# Patient Record
Sex: Female | Born: 1965
Health system: Southern US, Community
[De-identification: ages and names within clinical notes are randomized; demographics above are authoritative.]

## PROBLEM LIST (undated history)

## (undated) DIAGNOSIS — F3289 Other specified depressive episodes: Secondary | ICD-10-CM

## (undated) DIAGNOSIS — F329 Major depressive disorder, single episode, unspecified: Secondary | ICD-10-CM

## (undated) DIAGNOSIS — E559 Vitamin D deficiency, unspecified: Secondary | ICD-10-CM

## (undated) DIAGNOSIS — M129 Arthropathy, unspecified: Secondary | ICD-10-CM

## (undated) DIAGNOSIS — K219 Gastro-esophageal reflux disease without esophagitis: Secondary | ICD-10-CM

## (undated) DIAGNOSIS — G43909 Migraine, unspecified, not intractable, without status migrainosus: Secondary | ICD-10-CM

## (undated) DIAGNOSIS — D649 Anemia, unspecified: Secondary | ICD-10-CM

## (undated) DIAGNOSIS — J45909 Unspecified asthma, uncomplicated: Secondary | ICD-10-CM

## (undated) DIAGNOSIS — J309 Allergic rhinitis, unspecified: Secondary | ICD-10-CM

## (undated) DIAGNOSIS — D219 Benign neoplasm of connective and other soft tissue, unspecified: Secondary | ICD-10-CM

## (undated) HISTORY — DX: Allergic rhinitis, unspecified: J30.9

## (undated) HISTORY — DX: Other specified depressive episodes: F32.89

## (undated) HISTORY — DX: Gastro-esophageal reflux disease without esophagitis: K21.9

## (undated) HISTORY — DX: Morbid (severe) obesity due to excess calories: E66.01

## (undated) HISTORY — DX: Vitamin D deficiency, unspecified: E55.9

## (undated) HISTORY — DX: Unspecified asthma, uncomplicated: J45.909

## (undated) HISTORY — DX: Benign neoplasm of connective and other soft tissue, unspecified: D21.9

## (undated) HISTORY — PX: ROOT CANAL: SHX2363

## (undated) HISTORY — DX: Arthropathy, unspecified: M12.9

## (undated) HISTORY — PX: OTHER SURGICAL HISTORY: SHX169

## (undated) HISTORY — DX: Anemia, unspecified: D64.9

## (undated) HISTORY — DX: Major depressive disorder, single episode, unspecified: F32.9

## (undated) HISTORY — DX: Migraine, unspecified, not intractable, without status migrainosus: G43.909

---

## 1998-04-02 ENCOUNTER — Other Ambulatory Visit: Admission: RE | Admit: 1998-04-02 | Discharge: 1998-04-02 | Payer: Self-pay | Admitting: Internal Medicine

## 1999-12-30 ENCOUNTER — Other Ambulatory Visit: Admission: RE | Admit: 1999-12-30 | Discharge: 1999-12-30 | Payer: Self-pay | Admitting: Internal Medicine

## 2001-01-30 ENCOUNTER — Encounter: Admission: RE | Admit: 2001-01-30 | Discharge: 2001-03-06 | Payer: Self-pay | Admitting: Internal Medicine

## 2001-02-28 ENCOUNTER — Other Ambulatory Visit: Admission: RE | Admit: 2001-02-28 | Discharge: 2001-02-28 | Payer: Self-pay | Admitting: Internal Medicine

## 2002-03-14 ENCOUNTER — Other Ambulatory Visit: Admission: RE | Admit: 2002-03-14 | Discharge: 2002-03-14 | Payer: Self-pay | Admitting: Internal Medicine

## 2002-04-23 ENCOUNTER — Emergency Department (HOSPITAL_COMMUNITY): Admission: EM | Admit: 2002-04-23 | Discharge: 2002-04-23 | Payer: Self-pay | Admitting: Emergency Medicine

## 2005-06-05 ENCOUNTER — Emergency Department (HOSPITAL_COMMUNITY): Admission: EM | Admit: 2005-06-05 | Discharge: 2005-06-05 | Payer: Self-pay | Admitting: Emergency Medicine

## 2005-08-27 ENCOUNTER — Emergency Department (HOSPITAL_COMMUNITY): Admission: EM | Admit: 2005-08-27 | Discharge: 2005-08-27 | Payer: Self-pay | Admitting: Emergency Medicine

## 2005-10-02 ENCOUNTER — Ambulatory Visit (HOSPITAL_COMMUNITY): Admission: RE | Admit: 2005-10-02 | Discharge: 2005-10-03 | Payer: Self-pay | Admitting: General Surgery

## 2005-10-02 ENCOUNTER — Encounter (INDEPENDENT_AMBULATORY_CARE_PROVIDER_SITE_OTHER): Payer: Self-pay | Admitting: *Deleted

## 2006-08-20 ENCOUNTER — Ambulatory Visit (HOSPITAL_COMMUNITY): Admission: RE | Admit: 2006-08-20 | Discharge: 2006-08-20 | Payer: Self-pay | Admitting: Obstetrics & Gynecology

## 2006-09-03 ENCOUNTER — Encounter: Admission: RE | Admit: 2006-09-03 | Discharge: 2006-09-03 | Payer: Self-pay | Admitting: Obstetrics & Gynecology

## 2006-10-18 ENCOUNTER — Ambulatory Visit (HOSPITAL_COMMUNITY): Admission: RE | Admit: 2006-10-18 | Discharge: 2006-10-18 | Payer: Self-pay | Admitting: Obstetrics & Gynecology

## 2007-02-21 ENCOUNTER — Emergency Department (HOSPITAL_COMMUNITY): Admission: EM | Admit: 2007-02-21 | Discharge: 2007-02-22 | Payer: Self-pay | Admitting: Emergency Medicine

## 2007-06-23 ENCOUNTER — Emergency Department (HOSPITAL_COMMUNITY): Admission: EM | Admit: 2007-06-23 | Discharge: 2007-06-23 | Payer: Self-pay | Admitting: Emergency Medicine

## 2007-06-25 ENCOUNTER — Encounter: Admission: RE | Admit: 2007-06-25 | Discharge: 2007-06-25 | Payer: Self-pay | Admitting: Obstetrics & Gynecology

## 2007-08-08 HISTORY — PX: CHOLECYSTECTOMY: SHX55

## 2008-01-28 ENCOUNTER — Encounter: Admission: RE | Admit: 2008-01-28 | Discharge: 2008-01-28 | Payer: Self-pay | Admitting: Obstetrics & Gynecology

## 2008-11-05 LAB — HM MAMMOGRAPHY

## 2009-02-26 ENCOUNTER — Encounter: Admission: RE | Admit: 2009-02-26 | Discharge: 2009-02-26 | Payer: Self-pay | Admitting: Obstetrics & Gynecology

## 2010-01-05 LAB — CONVERTED CEMR LAB

## 2010-02-01 ENCOUNTER — Ambulatory Visit (HOSPITAL_COMMUNITY): Admission: RE | Admit: 2010-02-01 | Discharge: 2010-02-01 | Payer: Self-pay | Admitting: Obstetrics & Gynecology

## 2010-02-09 ENCOUNTER — Ambulatory Visit: Payer: Self-pay | Admitting: Internal Medicine

## 2010-02-09 DIAGNOSIS — F3289 Other specified depressive episodes: Secondary | ICD-10-CM | POA: Insufficient documentation

## 2010-02-09 DIAGNOSIS — D649 Anemia, unspecified: Secondary | ICD-10-CM | POA: Insufficient documentation

## 2010-02-09 DIAGNOSIS — J45909 Unspecified asthma, uncomplicated: Secondary | ICD-10-CM | POA: Insufficient documentation

## 2010-02-09 DIAGNOSIS — K219 Gastro-esophageal reflux disease without esophagitis: Secondary | ICD-10-CM | POA: Insufficient documentation

## 2010-02-09 DIAGNOSIS — Z9189 Other specified personal risk factors, not elsewhere classified: Secondary | ICD-10-CM | POA: Insufficient documentation

## 2010-02-09 DIAGNOSIS — G43909 Migraine, unspecified, not intractable, without status migrainosus: Secondary | ICD-10-CM | POA: Insufficient documentation

## 2010-02-09 DIAGNOSIS — F329 Major depressive disorder, single episode, unspecified: Secondary | ICD-10-CM

## 2010-02-09 DIAGNOSIS — R109 Unspecified abdominal pain: Secondary | ICD-10-CM | POA: Insufficient documentation

## 2010-02-09 DIAGNOSIS — J309 Allergic rhinitis, unspecified: Secondary | ICD-10-CM | POA: Insufficient documentation

## 2010-02-09 DIAGNOSIS — M1990 Unspecified osteoarthritis, unspecified site: Secondary | ICD-10-CM | POA: Insufficient documentation

## 2010-02-09 LAB — CONVERTED CEMR LAB
ALT: 14 units/L (ref 0–35)
AST: 15 units/L (ref 0–37)
Albumin: 3.7 g/dL (ref 3.5–5.2)
Alkaline Phosphatase: 91 units/L (ref 39–117)
BUN: 10 mg/dL (ref 6–23)
Basophils Absolute: 0 10*3/uL (ref 0.0–0.1)
Basophils Relative: 0.5 % (ref 0.0–3.0)
Bilirubin, Direct: 0.1 mg/dL (ref 0.0–0.3)
CO2: 29 meq/L (ref 19–32)
Calcium: 8.8 mg/dL (ref 8.4–10.5)
Chloride: 108 meq/L (ref 96–112)
Creatinine, Ser: 0.9 mg/dL (ref 0.4–1.2)
Eosinophils Absolute: 0.1 10*3/uL (ref 0.0–0.7)
Eosinophils Relative: 2 % (ref 0.0–5.0)
GFR calc non Af Amer: 85.34 mL/min (ref >60)
Glucose, Bld: 81 mg/dL (ref 70–99)
HCT: 39.8 % (ref 36.0–46.0)
Hemoglobin: 13.2 g/dL (ref 12.0–15.0)
Lymphocytes Relative: 32.3 % (ref 12.0–46.0)
Lymphs Abs: 2.2 10*3/uL (ref 0.7–4.0)
MCHC: 33.3 g/dL (ref 30.0–36.0)
MCV: 81.2 fL (ref 78.0–100.0)
Monocytes Absolute: 0.4 10*3/uL (ref 0.1–1.0)
Monocytes Relative: 6.4 % (ref 3.0–12.0)
Neutro Abs: 4.1 10*3/uL (ref 1.4–7.7)
Neutrophils Relative %: 58.8 % (ref 43.0–77.0)
Platelets: 351 10*3/uL (ref 150.0–400.0)
Potassium: 4.4 meq/L (ref 3.5–5.1)
RBC: 4.9 M/uL (ref 3.87–5.11)
RDW: 14.3 % (ref 11.5–14.6)
Sodium: 140 meq/L (ref 135–145)
TSH: 0.71 microintl units/mL (ref 0.35–5.50)
Total Bilirubin: 0.4 mg/dL (ref 0.3–1.2)
Total Protein: 6.9 g/dL (ref 6.0–8.3)
WBC: 6.9 10*3/uL (ref 4.5–10.5)

## 2010-03-02 ENCOUNTER — Ambulatory Visit: Payer: Self-pay | Admitting: Internal Medicine

## 2010-07-13 ENCOUNTER — Ambulatory Visit: Payer: Self-pay | Admitting: Internal Medicine

## 2010-07-13 DIAGNOSIS — R42 Dizziness and giddiness: Secondary | ICD-10-CM | POA: Insufficient documentation

## 2010-08-15 ENCOUNTER — Ambulatory Visit
Admission: RE | Admit: 2010-08-15 | Discharge: 2010-08-15 | Payer: Self-pay | Source: Home / Self Care | Attending: Internal Medicine | Admitting: Internal Medicine

## 2010-08-15 DIAGNOSIS — J329 Chronic sinusitis, unspecified: Secondary | ICD-10-CM | POA: Insufficient documentation

## 2010-08-15 DIAGNOSIS — J019 Acute sinusitis, unspecified: Secondary | ICD-10-CM | POA: Insufficient documentation

## 2010-08-28 ENCOUNTER — Encounter: Payer: Self-pay | Admitting: Obstetrics & Gynecology

## 2010-09-06 NOTE — Assessment & Plan Note (Signed)
Summary: ?vertigo/ears stopped up/sinuses draining/rx refill-lb   Vital Signs:  Patient profile:   45 year old female Height:      63 inches (160.02 cm) Weight:      264 pounds (120.00 kg) O2 Sat:      98 % on Room air Temp:     97.7 degrees F (36.50 degrees C) oral Pulse rate:   75 / minute BP sitting:   128 / 84  (left arm) Cuff size:   large  Vitals Entered By: Orlan Leavens RMA (July 13, 2010 2:27 PM)  O2 Flow:  Room air CC: Vertigo Is Patient Diabetic? No Pain Assessment Patient in pain? no      Comments Want to discuss sertraline, also need refills on nexium   Primary Care Provider:  Newt Lukes MD  CC:  Vertigo.  History of Present Illness: here for vertigo symptoms  onset >1 week ago - symptoms worse with head mvmt a/w sinus pressure and ear fullness no fever, no drainage - hx same - not improved with otc dramamine  depression - feels "stressed" and "no energy" primarily c/o motivational fatigue - c/o poor sleep - but +daytime sleepiness - +panic and anxiety spells "unexpected like in bed" - but no tearfulness contrib factors include work and death of dad >12 mo ago denies SI/HI started low dose sertraline - no adv se noted inc dose but not better than low dose, ?resume low dose   Clinical Review Panels:  Prevention   Last Mammogram:  No specific mammographic evidence of malignancy.   (11/05/2008)   Last Pap Smear:  Interpretation Result:Negative for intraepithelial Lesion or Malignancy.    (01/05/2010)  Immunizations   Last Tetanus Booster:  Historical (08/08/2007)   Last Pneumovax:  Historical (08/08/2007)  CBC   WBC:  6.9 (02/09/2010)   RBC:  4.90 (02/09/2010)   Hgb:  13.2 (02/09/2010)   Hct:  39.8 (02/09/2010)   Platelets:  351.0 (02/09/2010)   MCV  81.2 (02/09/2010)   MCHC  33.3 (02/09/2010)   RDW  14.3 (02/09/2010)   PMN:  58.8 (02/09/2010)   Lymphs:  32.3 (02/09/2010)   Monos:  6.4 (02/09/2010)   Eosinophils:  2.0  (02/09/2010)   Basophil:  0.5 (02/09/2010)  Complete Metabolic Panel   Glucose:  81 (02/09/2010)   Sodium:  140 (02/09/2010)   Potassium:  4.4 (02/09/2010)   Chloride:  108 (02/09/2010)   CO2:  29 (02/09/2010)   BUN:  10 (02/09/2010)   Creatinine:  0.9 (02/09/2010)   Albumin:  3.7 (02/09/2010)   Total Protein:  6.9 (02/09/2010)   Calcium:  8.8 (02/09/2010)   Total Bili:  0.4 (02/09/2010)   Alk Phos:  91 (02/09/2010)   SGPT (ALT):  14 (02/09/2010)   SGOT (AST):  15 (02/09/2010)   Current Medications (verified): 1)  Nexium 40 Mg Cpdr (Esomeprazole Magnesium) .... Take 1 By Mouth Once Daily 2)  Align  Caps (Probiotic Product) .Marland Kitchen.. 1 By Mouth Once Daily 3)  Sertraline Hcl 50 Mg Tabs (Sertraline Hcl) .Marland Kitchen.. 1 By Mouth Once Daily 4)  Ventolin Hfa 108 (90 Base) Mcg/act Aers (Albuterol Sulfate) .... 2 Puffs Every 4 Hours As Needed For Shortness of Breath  Allergies (verified): 1)  ! Penicillin  Past History:  Past Medical History: Allergic rhinitis Anemia-NOS Asthma GERD  anxiety   MD roster: gyn - jackson-moore    Review of Systems  The patient denies fever, vision loss, decreased hearing, chest pain, and headaches.  Physical Exam  General:  overweight-appearing.  alert, well-developed, well-nourished, and cooperative to examination.   mildly ill Head:  mild tenderness to palp over B max sinuses Eyes:  vision grossly intact; pupils equal, round and reactive to light.  conjunctiva and lids normal.    Ears:  B hazy TM with clear effusion R>L, no erythema Mouth:  teeth and gums in good repair; mucous membranes moist, without lesions or ulcers. oropharynx clear without exudate, no erythema. +PND Lungs:  normal respiratory effort, no intercostal retractions or use of accessory muscles; normal breath sounds bilaterally - no crackles and no wheezes.    Heart:  normal rate, regular rhythm, no murmur, and no rub. BLE without edema. Neurologic:  alert & oriented X3 and cranial  nerves II-XII symetrically intact.  strength normal in all extremities, sensation intact to light touch, and gait normal. speech fluent without dysarthria or aphasia; follows commands with good comprehension.  Psych:  Oriented X3, memory intact for recent and remote, normally interactive, fai eye contact, not anxious appearing mildly depressed appearing with subdued affect, not agitated.      Impression & Recommendations:  Problem # 1:  VERTIGO (ICD-780.4)  add meclizine also nasal steroid + zyrtec d for sinus symptoms  Her updated medication list for this problem includes:    Meclizine Hcl 25 Mg Tabs (Meclizine hcl) .Marland Kitchen... 1 by mouth three times a day as needed for dizzy symptoms  Demonstrated maneuvers to self-treat vertigo. Patient to call to be seen if no improvement in 10-14 days, sooner if worse.   Orders: Prescription Created Electronically [phone])  Problem # 2:  DEPRESSION (ICD-311)  Her updated medication list for this problem includes:    Sertraline Hcl 25 Mg Tabs (Sertraline hcl) .Marland Kitchen... 1 by mouth once daily  cont low dose sertraline as no sig change with higher dose trial  Problem # 3:  GERD (ICD-530.81)  Her updated medication list for this problem includes:    Nexium 40 Mg Cpdr (Esomeprazole magnesium) .Marland Kitchen... Take 1 by mouth once daily  Labs Reviewed: Hgb: 13.2 (02/09/2010)   Hct: 39.8 (02/09/2010)  Complete Medication List: 1)  Nexium 40 Mg Cpdr (Esomeprazole magnesium) .... Take 1 by mouth once daily 2)  Align Caps (Probiotic product) .Marland Kitchen.. 1 by mouth once daily 3)  Sertraline Hcl 25 Mg Tabs (Sertraline hcl) .Marland Kitchen.. 1 by mouth once daily 4)  Ventolin Hfa 108 (90 Base) Mcg/act Aers (Albuterol sulfate) .... 2 puffs every 4 hours as needed for shortness of breath 5)  Meclizine Hcl 25 Mg Tabs (Meclizine hcl) .Marland Kitchen.. 1 by mouth three times a day as needed for dizzy symptoms 6)  Fluticasone Propionate 50 Mcg/act Susp (Fluticasone propionate) .... 2 sprays each nostril  every  morning 7)  Zyrtec-d Allergy & Congestion 5-120 Mg Xr12h-tab (Cetirizine-pseudoephedrine) .Marland Kitchen.. 1 by mouth every morning  Patient Instructions: 1)  it was good to see you today. 2)  meclizine for dizziness and generic flonase for sinus symptoms  - also refill on nexium and lower sertraline dose - your prescriptions have been electronically submitted to your pharmacy. Please take as directed. Contact our office if you believe you're having problems with the medication(s).  3)  Get plenty of rest, drink lots of clear liquids, and use Tylenol or Ibuprofen for comfort. Return in 7-10 days if you're not better:sooner if you're feeling worse. 4)  Please schedule for physical and labs as discussed, call sooner if problems.  Prescriptions: FLUTICASONE PROPIONATE 50 MCG/ACT SUSP (FLUTICASONE PROPIONATE) 2  sprays each nostril  every morning  #1 x 3   Entered and Authorized by:   Newt Lukes MD   Signed by:   Newt Lukes MD on 07/13/2010   Method used:   Electronically to        [facility] (retail)       [address]       [place], [place]  [id number]       Ph: [phone]       Fax: [phone]   RxID:   [id number] MECLIZINE HCL 25 MG TABS (MECLIZINE HCL) 1 by mouth three times a day as needed for dizzy symptoms  #30 x 0   Entered and Authorized by:   Newt Lukes MD   Signed by:   Newt Lukes MD on 07/13/2010   Method used:   Electronically to        [facility] (retail)       [address]       [place], [place]  [id number]       Ph: [phone]       Fax: [phone]   RxID:   [id number] SERTRALINE HCL 25 MG TABS (SERTRALINE HCL) 1 by mouth once daily  #30 x 6   Entered and Authorized by:   Newt Lukes MD   Signed by:   Newt Lukes MD on 07/13/2010   Method used:   Electronically to        [facility] (retail)       [address]       [place], [place]   [id number]       Ph: [phone]       Fax: [phone]   RxID:   [id number]    Orders Added: 1)  Est. Patient Level IV [61443] 2)  Prescription Created Electronically [phone]

## 2010-09-06 NOTE — Assessment & Plan Note (Signed)
Summary: 3-4 wk fu---stc   Vital Signs:  Patient profile:   45 year old female Height:      63 inches (160.02 cm) Weight:      264.4 pounds (120.18 kg) O2 Sat:      98 % on Room air Temp:     98.4 degrees F (36.89 degrees C) oral Pulse rate:   95 / minute BP sitting:   120 / 82  (left arm) Cuff size:   large  Vitals Entered By: Orlan Leavens RMA (March 02, 2010 8:59 AM)  O2 Flow:  Room air CC: 3-4 week follow-up Is Patient Diabetic? No Pain Assessment Patient in pain? no        Primary Care Provider:  Newt Lukes MD  CC:  3-4 week follow-up.  History of Present Illness: here for f/u  c/o abd pain - onset 2 months ago- located across right side assoc with bloating and constipation - symptoms unchanged by diet efforts or meal times not improved with OTC meds off phenteramine 1 week - and seems improved s/p chole 2009 - but similar symptoms now as preop and postop no vomitting or nausea - eval by gyn negative for pelvic or ovarian cause of symptoms  improved with use of align  also c/o depression - feels "stressed" and "no energy" primarily c/o motivational fatigue - c/o poor sleep - but +daytime sleepiness - +panic and anxiety spells "unexpected like in bed" - but no tearfulness contrib factors include work and death of dad 12 mo ago (1year anniv death 4 days ago) - denies SI/HI started low dose sertraline - no adv se noted   Current Medications (verified): 1)  Nexium 40 Mg Cpdr (Esomeprazole Magnesium) .... Take 1 By Mouth Once Daily 2)  Align  Caps (Probiotic Product) .Marland Kitchen.. 1 By Mouth Once Daily 3)  Sertraline Hcl 25 Mg Tabs (Sertraline Hcl) .Marland Kitchen.. 1 By Mouth Once Daily  Allergies (verified): 1)  ! Penicillin  Past History:  Past Medical History: Allergic rhinitis Anemia-NOS Asthma GERD  anxiety  MD roster: gyn - jackson-moore  Review of Systems  The patient denies fever, chest pain, syncope, headaches, and abdominal pain.    Physical  Exam  General:  overweight-appearing.  alert, well-developed, well-nourished, and cooperative to examination.    Lungs:  normal respiratory effort, no intercostal retractions or use of accessory muscles; normal breath sounds bilaterally - no crackles and no wheezes.    Heart:  normal rate, regular rhythm, no murmur, and no rub. BLE without edema. Psych:  Oriented X3, memory intact for recent and remote, normally interactive, fai eye contact, not anxious appearing mildly depressed appearing with subdued affect, not agitated.      Impression & Recommendations:  Problem # 1:  DEPRESSION (ICD-311)  Her updated medication list for this problem includes:    Sertraline Hcl 50 Mg Tabs (Sertraline hcl) .Marland Kitchen... 1 by mouth once daily  may be contib to GI symptoms - see above- agrees to increase titration sertraline - erx done Time spent with patient 25 minutes, more than 50% of this time was spent counseling patient on depression and problems concerning the need for further med eval and tx trials - declines counseling referral at this time  Orders: Prescription Created Electronically [phone])  Problem # 2:  ABDOMINAL PAIN (ICD-789.00) Assessment: Improved  exam and hx generally benign -  s/p chole 2009 ?IBS, constipation predom- no med dz identified with recent labs (02/2010) - continue probiotics and simethacone -  Complete Medication List: 1)  Nexium 40 Mg Cpdr (Esomeprazole magnesium) .... Take 1 by mouth once daily 2)  Align Caps (Probiotic product) .Marland Kitchen.. 1 by mouth once daily 3)  Sertraline Hcl 50 Mg Tabs (Sertraline hcl) .Marland Kitchen.. 1 by mouth once daily 4)  Ventolin Hfa 108 (90 Base) Mcg/act Aers (Albuterol sulfate) .... 2 puffs every 4 hours as needed for shortness of breath  Patient Instructions: 1)  it was good to see you today. 2)  continue Align, or phillips colon health or any "probiotic" and take daily for your abdominal symptoms and constipation -  3)  continue sertraline for  depression, dose increase as as discussed -  4)  your prescription has been electronically submitted to your pharmacy. Please take as directed. Contact our office if you believe you're having problems with the medication(s).  5)  Please schedule a follow-up appointment in 2-3 months to reveiw your symptoms and medications, sooner if problems.  Prescriptions: SERTRALINE HCL 50 MG TABS (SERTRALINE HCL) 1 by mouth once daily  #30 x 2   Entered and Authorized by:   Newt Lukes MD   Signed by:   Newt Lukes MD on 03/02/2010   Method used:   Electronically to        [facility] (retail)       [address]       [place], [place]  [id number]       Ph: [phone]       Fax: [phone]   RxID:   [id number]

## 2010-09-06 NOTE — Assessment & Plan Note (Signed)
Summary: NEW / [facility] / # /CD   Vital Signs:  Patient profile:   45 year old female Height:      63 inches (160.02 cm) Weight:      264.6 pounds (120.27 kg) BMI:     47.04 O2 Sat:      99 % on Room air Temp:     97.6 degrees F (36.44 degrees C) oral Pulse rate:   80 / minute BP sitting:   110 / 82  (left arm) Cuff size:   large  Vitals Entered By: Orlan Leavens (February 09, 2010 9:39 AM)  O2 Flow:  Room air CC: New patient Is Patient Diabetic? No Pain Assessment Patient in pain? no        Primary Care Provider:  Newt Lukes MD  CC:  New patient.  History of Present Illness: new pt to me and our practice, here to est care -  c/o abd pain - onset 2 months ago- located across right side assoc with bloating and constipation - symptoms unchanged by diet efforts or meal times not improved with OTC meds off phenteramine 1 week - and seems improved s/p chole 2009 - but similar symptoms now as preop and postop no vomitting or nausea - eval by gyn negative for pelvic or ovarian cause of symptoms   also c/o depression - feels "stressed" and "no energy" primarily c/o motivational fatigue - c/o poor sleep - but +daytime sleepiness - +panic and anxiety spells "unexpected like in bed" - but no tearfulness contrib factors include work and death of dad 12 mo ago (1year anniv death 4 days ago) - denies SI/HI   Preventive Screening-Counseling & Management  Alcohol-Tobacco     Alcohol drinks/day: <1     Alcohol Counseling: not indicated; use of alcohol is not excessive or problematic     Smoking Status: never     Tobacco Counseling: not indicated; no tobacco use  Caffeine-Diet-Exercise     Does Patient Exercise: yes     Exercise Counseling: to improve exercise regimen  Safety-Violence-Falls     Seat Belt Use: yes     Seat Belt Counseling: not indicated; patient wears seat belts     Helmet Use: yes     Helmet Counseling: not indicated; patient wears helmet when  riding bicycle/motocycle     Firearms in the Home: no firearms in the home     Firearm Counseling: not applicable     Violence in the Home: no risk noted  Clinical Review Panels:  Prevention   Last Mammogram:  No specific mammographic evidence of malignancy.   (11/05/2008)   Last Pap Smear:  Interpretation Result:Negative for intraepithelial Lesion or Malignancy.    (01/05/2010)  Immunizations   Last Tetanus Booster:  Historical (08/08/2007)   Last Pneumovax:  Historical (08/08/2007)   Current Medications (verified): 1)  Nexium 40 Mg Cpdr (Esomeprazole Magnesium) .... Take 1 By Mouth Once Daily  Allergies (verified): 1)  ! Penicillin  Past History:  Past Medical History: Allergic rhinitis Anemia-NOS Asthma GERD  MD roster: gyn Tamela Oddi  Past Surgical History: Cholecystectomy (2009) Skin graft surgery on (R) leg (childhood)   Family History: Family History of Alcoholism/Addiction (parent) Family History of Arthritis (parent, grandparent) Family History Diabetes 1st degree relative (parent, grandparent) Family History High cholesterol (parent, grandparen, other Radio producer) Family History Hypertension (parent, grandparent, other relativ) Family History Ovarian cancer (grandparent)  Family History of Prostate CA 1st degree relative <50 (grandfather)  Social History:  Never Smoked, social alcohol divorced, lives with twin teenage daughters 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 I have reviewed all other systems and they were negative.   Physical Exam  General:  overweight-appearing.  alert, well-developed, well-nourished, and cooperative to examination.    Head:  Normocephalic and atraumatic without obvious abnormalities. No apparent alopecia or balding. Eyes:  vision grossly intact; pupils equal, round and reactive to light.  conjunctiva and  lids normal.    Ears:  normal pinnae bilaterally, without erythema, swelling, or tenderness to palpation. TMs clear, without effusion, or cerumen impaction. Hearing grossly normal bilaterally  Mouth:  teeth and gums in good repair; mucous membranes moist, without lesions or ulcers. oropharynx clear without exudate, no erythema.  Lungs:  normal respiratory effort, no intercostal retractions or use of accessory muscles; normal breath sounds bilaterally - no crackles and no wheezes.    Heart:  normal rate, regular rhythm, no murmur, and no rub. BLE without edema. Abdomen:  soft, non-tender, normal bowel sounds, no distention; no masses and no appreciable hepatomegaly or splenomegaly.   Msk:  No deformity or scoliosis noted of thoracic or lumbar spine.   Neurologic:  alert & oriented X3 and cranial nerves II-XII symetrically intact.  strength normal in all extremities, sensation intact to light touch, and gait normal. speech fluent without dysarthria or aphasia; follows commands with good comprehension.  Skin:  no rashes, vesicles, ulcers, or erythema. No nodules or irregularity to palpation.  Psych:  Oriented X3, memory intact for recent and remote, normally interactive, fai eye contact, not anxious appearing mildly depressed appearing with subdued affect,  not agitated.      Impression & Recommendations:  Problem # 1:  ABDOMINAL PAIN (ICD-789.00)  exam and hx generally benign -  s/p chole 2009 ?IBS, constipation predom- r/o med dz with labs - rec trial probiotics and simethacone - cont eval and tx to depend on lab and response to tx efforts - Orders: TLB-CBC Platelet - w/Differential (85025-CBCD) TLB-TSH (Thyroid Stimulating Hormone) (84443-TSH) TLB-Hepatic/Liver Function Pnl (80076-HEPATIC) TLB-BMP (Basic Metabolic Panel-BMET) (80048-METABOL)  Discussed symptom control with the patient.   Problem # 2:  DEPRESSION (ICD-311) may be contib to GI symptoms - see above- agrees to low dose  trial sertraline - erx done Time spent with patient 45 minutes, more than 50% of this time was spent counseling patient on depression and problems concerning the need for further med eval and tx trials - declines counseling referral at this time Her updated medication list for this problem includes:    Sertraline Hcl 25 Mg Tabs (Sertraline hcl) .Marland Kitchen... 1 by mouth once daily  Orders: TLB-CBC Platelet - w/Differential (85025-CBCD) TLB-TSH (Thyroid Stimulating Hormone) (84443-TSH) TLB-Hepatic/Liver Function Pnl (80076-HEPATIC) Prescription Created Electronically [phone])  Complete Medication List: 1)  Nexium 40 Mg Cpdr (Esomeprazole magnesium) .... Take 1 by mouth once daily 2)  Align Caps (Probiotic product) .Marland Kitchen.. 1 by mouth once daily 3)  Sertraline Hcl 25 Mg Tabs (Sertraline hcl) .Marland Kitchen.. 1 by mouth once daily  Patient Instructions: 1)  it was good to see you today. 2)  test(s) ordered today - your results will be posted on the phone tree for review in 48-72 hours from the time of test completion; call [phone] and enter your 9 digit MRN (listed above on this page, just below your name); if any changes  need to be made or there are abnormal results, you will be contacted directly.  3)  start Align, or phillips colon health or any "probiotic" and take daily for 30days for your abdominal symptoms and constipation -  4)  also start low dose sertraline for depression -  5)  your prescription has been electronically submitted to your pharmacy. Please take as directed. Contact our office if you believe you're having problems with the medication(s).  6)  Please schedule a follow-up appointment in 3-4 weeks to reveiw your symptoms and medications, sooner if problems.  Prescriptions: SERTRALINE HCL 25 MG TABS (SERTRALINE HCL) 1 by mouth once daily  #30 x 1   Entered and Authorized by:   Newt Lukes MD   Signed by:   Newt Lukes MD on 02/09/2010   Method used:   Electronically to         [facility] (retail)       [address]       [place], [place]  [id number]       Ph: [phone]       Fax: [phone]   RxID:   [id number]    Mammogram  Procedure date:  11/05/2008  Findings:      No specific mammographic evidence of malignancy.    Pap Smear  Procedure date:  01/05/2010  Findings:      Interpretation Result:Negative for intraepithelial Lesion or Malignancy.       Immunization History:  Tetanus/Td Immunization History:    Tetanus/Td:  historical (08/08/2007)  Pneumovax Immunization History:    Pneumovax:  historical (08/08/2007)

## 2010-09-08 NOTE — Assessment & Plan Note (Signed)
Summary: sinus inf?/cd   Vital Signs:  Patient profile:   45 year old female Height:      63 inches (160.02 cm) Weight:      264 pounds (120.00 kg) O2 Sat:      99 % on Room air Temp:     98.8 degrees F (37.11 degrees C) oral Pulse rate:   99 / minute BP sitting:   112 / 82  (left arm) Cuff size:   large  Vitals Entered By: Orlan Leavens RMA (August 15, 2010 1:27 PM)  O2 Flow:  Room air CC: ? sinus infection, URI symptoms Is Patient Diabetic? No Pain Assessment Patient in pain? no        Primary Care Provider:  Newt Lukes MD  CC:  ? sinus infection and URI symptoms.  History of Present Illness:  URI Symptoms      This is a 45 year old woman who presents with URI symptoms.  The symptoms began 1 week ago.  The severity is described as moderate.  not responding to OTC meds for cough and cold -.  The patient reports nasal congestion, purulent nasal discharge, and productive cough, but denies sore throat, earache, and sick contacts.  Associated symptoms include low-grade fever (<100.5 degrees), wheezing, and use of an antipyretic.  The patient denies stiff neck, dyspnea, rash, vomiting, and diarrhea.  The patient also reports sneezing, headache, muscle aches, and severe fatigue.  The patient denies seasonal symptoms.  The patient denies the following risk factors for Strep sinusitis: tooth pain, Strep exposure, and tender adenopathy.    hx vertigo symptoms improved with meclizine as needed   depression - feels "stressed" and "no energy" primarily c/o motivational fatigue - c/o poor sleep - but +daytime sleepiness - +panic and anxiety spells "unexpected like in bed" - but no tearfulness contrib factors include work and death of dad >12 mo ago denies SI/HI started low dose sertraline - no adv se noted inc dose but not better than low dose, remains on low dose - improved   Clinical Review Panels:  CBC   WBC:  6.9 (02/09/2010)   RBC:  4.90 (02/09/2010)   Hgb:  13.2  (02/09/2010)   Hct:  39.8 (02/09/2010)   Platelets:  351.0 (02/09/2010)   MCV  81.2 (02/09/2010)   MCHC  33.3 (02/09/2010)   RDW  14.3 (02/09/2010)   PMN:  58.8 (02/09/2010)   Lymphs:  32.3 (02/09/2010)   Monos:  6.4 (02/09/2010)   Eosinophils:  2.0 (02/09/2010)   Basophil:  0.5 (02/09/2010)  Complete Metabolic Panel   Glucose:  81 (02/09/2010)   Sodium:  140 (02/09/2010)   Potassium:  4.4 (02/09/2010)   Chloride:  108 (02/09/2010)   CO2:  29 (02/09/2010)   BUN:  10 (02/09/2010)   Creatinine:  0.9 (02/09/2010)   Albumin:  3.7 (02/09/2010)   Total Protein:  6.9 (02/09/2010)   Calcium:  8.8 (02/09/2010)   Total Bili:  0.4 (02/09/2010)   Alk Phos:  91 (02/09/2010)   SGPT (ALT):  14 (02/09/2010)   SGOT (AST):  15 (02/09/2010)   Current Medications (verified): 1)  Nexium 40 Mg Cpdr (Esomeprazole Magnesium) .... Take 1 By Mouth Once Daily 2)  Align  Caps (Probiotic Product) .Marland Kitchen.. 1 By Mouth Once Daily 3)  Sertraline Hcl 25 Mg Tabs (Sertraline Hcl) .Marland Kitchen.. 1 By Mouth Once Daily 4)  Ventolin Hfa 108 (90 Base) Mcg/act Aers (Albuterol Sulfate) .... 2 Puffs Every 4 Hours As Needed For  Shortness of Breath 5)  Meclizine Hcl 25 Mg Tabs (Meclizine Hcl) .Marland Kitchen.. 1 By Mouth Three Times A Day As Needed For Dizzy Symptoms 6)  Fluticasone Propionate 50 Mcg/act Susp (Fluticasone Propionate) .... 2 Sprays Each Nostril  Every Morning 7)  Zyrtec-D Allergy & Congestion 5-120 Mg Xr12h-Tab (Cetirizine-Pseudoephedrine) .Marland Kitchen.. 1 By Mouth Every Morning  Allergies (verified): 1)  ! Penicillin  Past History:  Past Medical History: Allergic rhinitis Anemia-NOS Asthma GERD  anxiety   MD roster:  gyn - [facility]    Review of Systems  The patient denies anorexia, weight loss, vision loss, decreased hearing, hoarseness, chest pain, and abdominal pain.    Physical Exam  General:  overweight-appearing.  alert, well-developed, well-nourished, and cooperative to examination.   mildly ill Eyes:  vision  grossly intact; pupils equal, round and reactive to light.  conjunctiva and lids normal.    Ears:  B hazy TM with clear effusion R>L, no erythema Nose:  mild max pain to palp B Mouth:  teeth and gums in good repair; mucous membranes moist, without lesions or ulcers. oropharynx clear without exudate, min erythema. +PND Lungs:  normal respiratory effort, no intercostal retractions or use of accessory muscles; normal breath sounds bilaterally - no crackles and no wheezes.    Heart:  normal rate, regular rhythm, no murmur, and no rub. BLE without edema.   Impression & Recommendations:  Problem # 1:  ACUTE SINUSITIS, UNSPECIFIED (ICD-461.9)  Her updated medication list for this problem includes:    Fluticasone Propionate 50 Mcg/act Susp (Fluticasone propionate) .Marland Kitchen... 2 sprays each nostril  every morning    Zyrtec-d Allergy & Congestion 5-120 Mg Xr12h-tab (Cetirizine-pseudoephedrine) .Marland Kitchen... 1 by mouth every morning    Levaquin 500 Mg Tabs (Levofloxacin) .Marland Kitchen... 1 by mouth once daily x 7 days    Hydromet 5-1.5 Mg/72ml Syrp (Hydrocodone-homatropine) .Marland KitchenMarland KitchenMarland KitchenMarland Kitchen 5 cc by mouth every 6 hours as needed for cough symptoms  Instructed on treatment. Call if symptoms persist or worsen.   Orders: Prescription Created Electronically [phone])  Complete Medication List: 1)  Nexium 40 Mg Cpdr (Esomeprazole magnesium) .... Take 1 by mouth once daily 2)  Align Caps (Probiotic product) .Marland Kitchen.. 1 by mouth once daily 3)  Sertraline Hcl 25 Mg Tabs (Sertraline hcl) .Marland Kitchen.. 1 by mouth once daily 4)  Ventolin Hfa 108 (90 Base) Mcg/act Aers (Albuterol sulfate) .... 2 puffs every 4 hours as needed for shortness of breath 5)  Meclizine Hcl 25 Mg Tabs (Meclizine hcl) .Marland Kitchen.. 1 by mouth three times a day as needed for dizzy symptoms 6)  Fluticasone Propionate 50 Mcg/act Susp (Fluticasone propionate) .... 2 sprays each nostril  every morning 7)  Zyrtec-d Allergy & Congestion 5-120 Mg Xr12h-tab (Cetirizine-pseudoephedrine) .Marland Kitchen.. 1 by mouth  every morning 8)  Levaquin 500 Mg Tabs (Levofloxacin) .Marland Kitchen.. 1 by mouth once daily x 7 days 9)  Hydromet 5-1.5 Mg/29ml Syrp (Hydrocodone-homatropine) .... 5 cc by mouth every 6 hours as needed for cough symptoms  Patient Instructions: 1)  it was good to see you today. 2)  Levaquin and hydromet as discussed for sinus symptoms - your prescriptions have been electronically submitted to your pharmacy. Please take as directed. Contact our office if you believe you're having problems with the medication(s). 3)  Get plenty of rest, drink lots of clear liquids, and use Tylenol or Ibuprofen for fever and comfort. Return in 7-10 days if you're not better:sooner if you're feeling worse. 4)  Please schedule for physical and labs as discussed, call sooner if  problems.  Prescriptions: HYDROMET 5-1.5 MG/5ML SYRP (HYDROCODONE-HOMATROPINE) 5 cc by mouth every 6 hours as needed for cough symptoms  #100cc x 0   Entered and Authorized by:   Newt Lukes MD   Signed by:   Newt Lukes MD on 08/15/2010   Method used:   Printed then faxed to ...       [facility] (retail)       [address]       [place], [place]  [id number]       Ph: [phone]       Fax: [phone]   RxID:   [id number] LEVAQUIN 500 MG TABS (LEVOFLOXACIN) 1 by mouth once daily x 7 days  #7 x 0   Entered and Authorized by:   Newt Lukes MD   Signed by:   Newt Lukes MD on 08/15/2010   Method used:   Electronically to        [facility] (retail)       [address]       [place], [place]  [id number]       Ph: [phone]       Fax: [phone]   RxID:   [id number]    Orders Added: 1)  Est. Patient Level IV [02542] 2)  Prescription Created Electronically [phone]

## 2010-09-20 ENCOUNTER — Other Ambulatory Visit: Payer: Self-pay | Admitting: Obstetrics & Gynecology

## 2010-09-20 DIAGNOSIS — Z1231 Encounter for screening mammogram for malignant neoplasm of breast: Secondary | ICD-10-CM

## 2010-09-22 ENCOUNTER — Encounter (INDEPENDENT_AMBULATORY_CARE_PROVIDER_SITE_OTHER): Payer: Self-pay | Admitting: *Deleted

## 2010-09-22 ENCOUNTER — Other Ambulatory Visit: Payer: 59

## 2010-09-22 ENCOUNTER — Other Ambulatory Visit: Payer: Self-pay | Admitting: Internal Medicine

## 2010-09-22 ENCOUNTER — Other Ambulatory Visit: Payer: Self-pay

## 2010-09-22 DIAGNOSIS — Z0000 Encounter for general adult medical examination without abnormal findings: Secondary | ICD-10-CM

## 2010-09-22 LAB — HEPATIC FUNCTION PANEL
ALT: 15 U/L (ref 0–35)
AST: 14 U/L (ref 0–37)
Albumin: 3.3 g/dL — ABNORMAL LOW (ref 3.5–5.2)
Alkaline Phosphatase: 105 U/L (ref 39–117)
Bilirubin, Direct: 0.1 mg/dL (ref 0.0–0.3)

## 2010-09-22 LAB — URINALYSIS, ROUTINE W REFLEX MICROSCOPIC
Bilirubin Urine: NEGATIVE
Ketones, ur: NEGATIVE
Leukocytes, UA: NEGATIVE
Nitrite: NEGATIVE
Total Protein, Urine: NEGATIVE
Urobilinogen, UA: 0.2 (ref 0.0–1.0)
pH: 6.5 (ref 5.0–8.0)

## 2010-09-22 LAB — BASIC METABOLIC PANEL
BUN: 14 mg/dL (ref 6–23)
CO2: 27 mEq/L (ref 19–32)
Calcium: 8.6 mg/dL (ref 8.4–10.5)
Chloride: 107 mEq/L (ref 96–112)
Creatinine, Ser: 1.1 mg/dL (ref 0.4–1.2)
GFR: 72.27 mL/min (ref >60.00)
Glucose, Bld: 77 mg/dL (ref 70–99)
Sodium: 139 mEq/L (ref 135–145)

## 2010-09-22 LAB — CBC WITH DIFFERENTIAL/PLATELET
Basophils Absolute: 0 10*3/uL (ref 0.0–0.1)
Basophils Relative: 0.5 % (ref 0.0–3.0)
Eosinophils Absolute: 0.2 10*3/uL (ref 0.0–0.7)
HCT: 37.5 % (ref 36.0–46.0)
Hemoglobin: 12.3 g/dL (ref 12.0–15.0)
Lymphocytes Relative: 35.3 % (ref 12.0–46.0)
MCHC: 32.9 g/dL (ref 30.0–36.0)
MCV: 80.9 fl (ref 78.0–100.0)
Monocytes Relative: 6.7 % (ref 3.0–12.0)
Neutro Abs: 3.9 10*3/uL (ref 1.4–7.7)
Neutrophils Relative %: 55 % (ref 43.0–77.0)
Platelets: 284 10*3/uL (ref 150.0–400.0)
RBC: 4.64 Mil/uL (ref 3.87–5.11)
RDW: 14.1 % (ref 11.5–14.6)

## 2010-09-22 LAB — LIPID PANEL
Cholesterol: 199 mg/dL (ref 0–200)
HDL: 52.2 mg/dL (ref >39.00)
LDL Cholesterol: 126 mg/dL — ABNORMAL HIGH (ref 0–99)
Total CHOL/HDL Ratio: 4
Triglycerides: 105 mg/dL (ref 0.0–149.0)
VLDL: 21 mg/dL (ref 0.0–40.0)

## 2010-09-22 LAB — TSH: TSH: 0.74 u[IU]/mL (ref 0.35–5.50)

## 2010-09-23 ENCOUNTER — Ambulatory Visit: Payer: Self-pay

## 2010-09-26 ENCOUNTER — Encounter (INDEPENDENT_AMBULATORY_CARE_PROVIDER_SITE_OTHER): Payer: 59 | Admitting: Internal Medicine

## 2010-09-26 ENCOUNTER — Encounter: Payer: Self-pay | Admitting: Internal Medicine

## 2010-09-26 DIAGNOSIS — F3289 Other specified depressive episodes: Secondary | ICD-10-CM

## 2010-09-26 DIAGNOSIS — R42 Dizziness and giddiness: Secondary | ICD-10-CM

## 2010-09-26 DIAGNOSIS — Z0000 Encounter for general adult medical examination without abnormal findings: Secondary | ICD-10-CM

## 2010-09-26 DIAGNOSIS — F329 Major depressive disorder, single episode, unspecified: Secondary | ICD-10-CM

## 2010-09-26 DIAGNOSIS — E669 Obesity, unspecified: Secondary | ICD-10-CM | POA: Insufficient documentation

## 2010-10-04 NOTE — Assessment & Plan Note (Signed)
Summary: cpx/lb   Vital Signs:  Patient profile:   45 year old female Height:      63 inches (160.02 cm) Weight:      269.0 pounds (122.27 kg) O2 Sat:      98 % on Room air Temp:     98.4 degrees F (36.89 degrees C) oral Pulse rate:   72 / minute BP sitting:   112 / 80  (left arm) Cuff size:   large  Vitals Entered By: Orlan Leavens RMA (September 26, 2010 1:16 PM)  O2 Flow:  Room air CC: CPX Is Patient Diabetic? No Pain Assessment Patient in pain? no        Primary Care Provider:  Newt Lukes MD  CC:  CPX.  History of Present Illness: patient is here today for annual physical. Patient feels well and has no complaints.    also reviewed chronic med issues: hx vertigo symptoms improved with meclizine as needed but still breaktru spells 1-2/month ?therapy other med or ENT eval needed  depression - marked by feelings of "stressed" and "no energy" primarily c/o motivational fatigue - c/o poor sleep - but +daytime sleepiness - +panic and anxiety spells "unexpected like in bed" - but no tearfulness contrib factors include work and death of dad >12 mo ago denies SI/HI started low dose sertraline - no adv se noted tired inc dose but not better than low dose so remains on low dose - improved  obesity - prior diets have resulted in temp weight loss, never >40# and never sustained loss ?bariatric options or other tx   Preventive Screening-Counseling & Management  Alcohol-Tobacco     Alcohol drinks/day: <1     Alcohol Counseling: not indicated; use of alcohol is not excessive or problematic     Smoking Status: never     Tobacco Counseling: not indicated; no tobacco use  Caffeine-Diet-Exercise     Does Patient Exercise: yes     Exercise Counseling: to improve exercise regimen  Safety-Violence-Falls     Seat Belt Use: yes     Seat Belt Counseling: not indicated; patient wears seat belts     Helmet Use: yes     Helmet Counseling: not indicated; patient wears  helmet when riding bicycle/motocycle     Firearms in the Home: no firearms in the home     Firearm Counseling: not applicable     Violence in the Home: no risk noted  Clinical Review Panels:  Prevention   Last Mammogram:  No specific mammographic evidence of malignancy.   (11/05/2008)   Last Pap Smear:  Interpretation Result:Negative for intraepithelial Lesion or Malignancy.    (01/05/2010)  Immunizations   Last Tetanus Booster:  Historical (08/08/2007)   Last Pneumovax:  Historical (08/08/2007)  Lipid Management   Cholesterol:  199 (09/22/2010)   LDL (bad choesterol):  126 (09/22/2010)   HDL (good cholesterol):  52.20 (09/22/2010)  CBC   WBC:  7.0 (09/22/2010)   RBC:  4.64 (09/22/2010)   Hgb:  12.3 (09/22/2010)   Hct:  37.5 (09/22/2010)   Platelets:  284.0 (09/22/2010)   MCV  80.9 (09/22/2010)   MCHC  32.9 (09/22/2010)   RDW  14.1 (09/22/2010)   PMN:  55.0 (09/22/2010)   Lymphs:  35.3 (09/22/2010)   Monos:  6.7 (09/22/2010)   Eosinophils:  2.5 (09/22/2010)   Basophil:  0.5 (09/22/2010)  Complete Metabolic Panel   Glucose:  77 (09/22/2010)   Sodium:  139 (09/22/2010)   Potassium:  4.7 (09/22/2010)  Chloride:  107 (09/22/2010)   CO2:  27 (09/22/2010)   BUN:  14 (09/22/2010)   Creatinine:  1.1 (09/22/2010)   Albumin:  3.3 (09/22/2010)   Total Protein:  6.4 (09/22/2010)   Calcium:  8.6 (09/22/2010)   Total Bili:  0.6 (09/22/2010)   Alk Phos:  105 (09/22/2010)   SGPT (ALT):  15 (09/22/2010)   SGOT (AST):  14 (09/22/2010)   Current Medications (verified): 1)  Nexium 40 Mg Cpdr (Esomeprazole Magnesium) .... Take 1 By Mouth Once Daily 2)  Align  Caps (Probiotic Product) .Marland Kitchen.. 1 By Mouth Once Daily 3)  Sertraline Hcl 25 Mg Tabs (Sertraline Hcl) .Marland Kitchen.. 1 By Mouth Once Daily 4)  Ventolin Hfa 108 (90 Base) Mcg/act Aers (Albuterol Sulfate) .... 2 Puffs Every 4 Hours As Needed For Shortness of Breath 5)  Meclizine Hcl 25 Mg Tabs (Meclizine Hcl) .Marland Kitchen.. 1 By Mouth Three Times  A Day As Needed For Dizzy Symptoms 6)  Fluticasone Propionate 50 Mcg/act Susp (Fluticasone Propionate) .... 2 Sprays Each Nostril  Every Morning 7)  Zyrtec-D Allergy & Congestion 5-120 Mg Xr12h-Tab (Cetirizine-Pseudoephedrine) .Marland Kitchen.. 1 By Mouth Every Morning  Allergies (verified): 1)  ! Penicillin  Past History:  Past medical, surgical, family and social histories (including risk factors) reviewed, and no changes noted (except as noted below).  Past Medical History: Allergic rhinitis Anemia-NOS Asthma GERD  anxiety    MD roster:  gyn Tamela Oddi    Past Surgical History: Reviewed history from 02/09/2010 and no changes required. Cholecystectomy (2009) Skin graft surgery on (R) leg (childhood)   Family History: Reviewed history from 02/09/2010 and no changes required. Family History of Alcoholism/Addiction (parent) Family History of Arthritis (parent, grandparent) Family History Diabetes 1st degree relative (parent, grandparent) Family History High cholesterol (parent, grandparen, other Radio producer) Family History Hypertension (parent, grandparent, other relativ) Family History Ovarian cancer (grandparent)  Family History of Prostate CA 1st degree relative <50 (grandfather)  Social History: Reviewed history from 02/09/2010 and no changes required. 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 I have reviewed all other systems and they were negative.   Physical Exam  General:  overweight-appearing.  alert, well-developed, well-nourished, and cooperative to examination. Head:  Normocephalic and atraumatic without obvious abnormalities. No apparent alopecia or balding. Eyes:  vision grossly intact; pupils equal, round and reactive to light.  conjunctiva and lids normal.    Ears:  R ear normal and L ear normal.   Mouth:  teeth and gums in good repair; mucous  membranes moist, without lesions or ulcers. oropharynx clear without exudate, no erythema.  Neck:  supple, full ROM, no masses, no thyromegaly; no thyroid nodules or tenderness. no JVD or carotid bruits.   Lungs:  normal respiratory effort, no intercostal retractions or use of accessory muscles; normal breath sounds bilaterally - no crackles and no wheezes.    Heart:  normal rate, regular rhythm, no murmur, and no rub. BLE without edema. normal DP pulses and normal cap refill in all 4 extremities    Abdomen:  soft, non-tender, normal bowel sounds, no distention; no masses and no appreciable hepatomegaly or splenomegaly.   Genitalia:  defer gyn Msk:  No deformity or scoliosis noted of thoracic or lumbar spine.   Neurologic:  alert & oriented X3 and cranial nerves II-XII symetrically intact.  strength normal in all extremities, sensation intact to light touch,  and gait normal. speech fluent without dysarthria or aphasia; follows commands with good comprehension.  Skin:  no rashes, vesicles, ulcers, or erythema. No nodules or irregularity to palpation.  Psych:  Oriented X3, memory intact for recent and remote, normally interactive, fai eye contact, not anxious appearing mildly depressed appearing with subdued affect, not agitated.      Impression & Recommendations:  Problem # 1:  PREVENTIVE HEALTH CARE (ICD-V70.0) Patient has been counseled on age-appropriate routine health concerns for screening and prevention. These are reviewed and up-to-date. Immunizations are up-to-date or declined. Labs and ECG reviewed.   Problem # 2:  VERTIGO (ICD-780.4)  Her updated medication list for this problem includes:    Meclizine Hcl 25 Mg Tabs (Meclizine hcl) .Marland Kitchen... 1 by mouth three times a day as needed for dizzy symptoms  improved but not resolved with meclizine also using nasal steroid + zyrtec d for sinus and allg symptoms  refer to [facility] rehab and consider ent if not changed  Orders: Misc. Referral  (Misc. Ref)  Problem # 3:  DEPRESSION (ICD-311)  Her updated medication list for this problem includes:    Sertraline Hcl 25 Mg Tabs (Sertraline hcl) .Marland Kitchen... 1 by mouth once daily  cont low dose sertraline as no sig change with higher dose trial  Problem # 4:  OBESITY, MORBID (ICD-278.01) discussed need for exercise, diet and weight reduction - will consider bariatric education for surg options Ht: 63 (09/26/2010)   Wt: 269.0 (09/26/2010)   BMI: 47.04 (02/09/2010)  Complete Medication List: 1)  Nexium 40 Mg Cpdr (Esomeprazole magnesium) .... Take 1 by mouth once daily 2)  Align Caps (Probiotic product) .Marland Kitchen.. 1 by mouth once daily 3)  Sertraline Hcl 25 Mg Tabs (Sertraline hcl) .Marland Kitchen.. 1 by mouth once daily 4)  Ventolin Hfa 108 (90 Base) Mcg/act Aers (Albuterol sulfate) .... 2 puffs every 4 hours as needed for shortness of breath 5)  Meclizine Hcl 25 Mg Tabs (Meclizine hcl) .Marland Kitchen.. 1 by mouth three times a day as needed for dizzy symptoms 6)  Fluticasone Propionate 50 Mcg/act Susp (Fluticasone propionate) .... 2 sprays each nostril  every morning 7)  Zyrtec-d Allergy & Congestion 5-120 Mg Xr12h-tab (Cetirizine-pseudoephedrine) .Marland Kitchen.. 1 by mouth every morning  Patient Instructions: 1)  it was good to see you today. 2)  labs, exam and EKG today look good 3)  it is important that you work on losing weight - monitor your diet and consume fewer calories such as less carbohydrates (sugar) and less fat. you also need to increase your physical activity level - start by walking for 10-20 minutes 3 times per week and work up to 30 minutes 4-5 times each week.  4)  Schedule your mammogram. 5)  Please schedule a follow-up appointment in 4-6 months to reviewe weight, depression, vertigo and medications; call sooner if problems.    Orders Added: 1)  Est. Patient 40-64 years [99396] 2)  Est. Patient Level III [45409] 3)  Misc. Referral [Misc. Ref]

## 2010-11-11 ENCOUNTER — Ambulatory Visit
Admission: RE | Admit: 2010-11-11 | Discharge: 2010-11-11 | Disposition: A | Discharge status: Home / Self Care | Payer: 59 | Source: Ambulatory Visit | Attending: Obstetrics & Gynecology | Admitting: Obstetrics & Gynecology

## 2010-11-11 DIAGNOSIS — Z1231 Encounter for screening mammogram for malignant neoplasm of breast: Secondary | ICD-10-CM

## 2010-12-23 NOTE — Discharge Summary (Signed)
NAME:  Alicia Chen, Alicia Chen                  ACCOUNT NO.:  [id number]   MEDICAL RECORD NO.:  [id number]          PATIENT TYPE:  OIB   LOCATION:  5735                         FACILITY:  [facility]   PHYSICIAN:  Gabrielle Dare. Janee Morn, M.D.DATE OF BIRTH:  11-15-1965   DATE OF ADMISSION:  10/02/2005  DATE OF DISCHARGE:  10/03/2005                                 DISCHARGE SUMMARY   DISCHARGE DIAGNOSES:  1.  Symptomatic cholelithiasis.  2.  Status post laparoscopic cholecystectomy with intraoperative      cholangiogram.   HISTORY OF PRESENT ILLNESS:  Patient is a 45 year old African-American  female who presented for elective cholecystectomy.   HOSPITAL PROCEDURES:  Patient underwent uneventful laparoscopic  cholecystectomy with intraoperative cholangiogram.  Postoperatively she  tolerated gradual advancement of her diet.  She remained afebrile and  hemodynamically stable.  She is discharged home in stable condition on  postoperative day #1.   DISCHARGE DIET:  Low fat.   DISCHARGE ACTIVITY:  No lifting.   DISCHARGE MEDICATIONS:  Percocet one to two every six hours as needed for  pain.   FOLLOWUP:  Follow-up in three weeks with myself.      Gabrielle Dare Janee Morn, M.D.  Electronically Signed     BET/MEDQ  D:  10/03/2005  T:  10/03/2005  Job:  [id number]

## 2010-12-23 NOTE — Op Note (Signed)
NAME:  SHAQUITA, FORT                  ACCOUNT NO.:  [id number]   MEDICAL RECORD NO.:  [id number]          PATIENT TYPE:  OIB   LOCATION:  2550                         FACILITY:  [facility]   PHYSICIAN:  Gabrielle Dare. Janee Morn, M.D.DATE OF BIRTH:  09/23/1965   DATE OF PROCEDURE:  10/02/2005  DATE OF DISCHARGE:                                 OPERATIVE REPORT   PREOPERATIVE DIAGNOSIS:  Symptomatic cholelithiasis.   POSTOPERATIVE DIAGNOSIS:  Symptomatic cholelithiasis.   PROCEDURES:  Laparoscopic cholecystectomy with intraoperative cholangiogram.   SURGEON:  Violeta Gelinas.   ASSISTANT:  Marcy Panning.   ANESTHESIA:  General.   HISTORY OF PRESENT ILLNESS:  The patient is a 45 year old African-American  female who I evaluated in the office after an episode of severe epigastric  and right upper quadrant abdominal pain, workup revealed multiple  gallstones. She presents today for elective cholecystectomy.   PROCEDURE IN DETAIL:  Informed consent was obtained.  The patient received  intravenous antibiotics. She is brought to the operating room. General  anesthesia was administered. Her abdomen was prepped and draped in sterile  fashion.  0.25% Marcaine with epinephrine was injected in the infraumbilical  area, infraumbilical incision was made. Subcutaneous tissues were dissected  down revealing the anterior fascia. This was divided sharply. Peritoneal  cavity was then entered under direct vision without difficulty. A 0 Vicryl  pursestring suture was placed around the fascial opening and the Hasson  trocar was inserted into the abdomen. The abdomen was insufflated with  carbon dioxide in standard fashion.  Under direct vision 11 mm epigastric  and two 5-mm lateral ports were placed. The dome of the gallbladder was  retracted superior medially. Please note that local anesthetic was used at  all port sites. There were several adhesions to the body and infundibulum of  the gallbladder. These were  carefully taken down using blunt and sharp  dissection and Bovie cautery and we were able to sweep all of these away  down revealing the infundibulum. Dissection continued laterally and  progressed medially identifying the cystic artery which was overlying the  cystic duct.  We had to dissect this first.  It was then clipped twice  proximally once, distally and divided.  This assisted in our dissection of  cystic duct.  That continued until a large window was created between the  cystic duct, the infundibulum and the liver. Once this was accomplished with  good visualization, a clip was placed on the infundibulum cystic duct  junction. A small nick was made in the cystic duct and a Reddick  cholangiogram catheter was inserted. Intraoperative cholangiogram was  obtained which demonstrated no common bile duct filling defects and good  flow of contrast in the duodenum. The catheter was removed and three clips  were placed proximally on the cystic duct and it was divided. Further  dissection revealed a posterior branch of the cystic artery. This was  clipped twice proximally once distally and divided. The gallbladder was then  taken off the liver bed with Bovie cautery. About halfway along we had  identified, another vessel or duct  of Luschka.  this was clipped twice  proximally, once distally divided with scissors.  Divided gallbladder was  removed completely from the liver bed placed in an EndoCatch bag and taken  out the abdomen via the infraumbilical port site. It contained multiple  gallstones.  The liver bed was then cauterized to get excellent hemostasis.  The abdomen was copiously irrigated with saline.  The irrigation fluid  returned clear. The liver bed was rechecked and excellent hemostasis was  present. The remainder of the irrigation fluid was evacuated. The ports were  then removed under direct vision. The pneumoperitoneum was released. The  infraumbilical fascia was closed by  tying the 0 Vicryl pursestring suture  with care not to trap any intra-abdominal contents. The wounds were  copiously irrigated. Some additional local anesthetic was injected and the  skin of each was closed with running 4-0 Vicryl subcuticular stitch. Sponge,  needle and instrument counts were correct. Benzoin, Steri-Strips and sterile  dressings were applied. The patient tolerated procedure well without  apparent complication, was taken recovery in stable condition.      Gabrielle Dare Janee Morn, M.D.  Electronically Signed     BET/MEDQ  D:  10/02/2005  T:  10/02/2005  Job:  [id number]   cc:   Merlene Laughter. Renae Gloss, M.D.  Fax: [phone]

## 2011-01-13 ENCOUNTER — Encounter: Payer: Self-pay | Admitting: Internal Medicine

## 2011-01-13 ENCOUNTER — Ambulatory Visit (INDEPENDENT_AMBULATORY_CARE_PROVIDER_SITE_OTHER): Payer: 59 | Admitting: Internal Medicine

## 2011-01-13 ENCOUNTER — Encounter: Payer: Self-pay | Admitting: *Deleted

## 2011-01-13 VITALS — BP 118/82 | HR 80 | Temp 98.2°F | Ht 63.0 in

## 2011-01-13 DIAGNOSIS — J309 Allergic rhinitis, unspecified: Secondary | ICD-10-CM

## 2011-01-13 DIAGNOSIS — J0100 Acute maxillary sinusitis, unspecified: Secondary | ICD-10-CM

## 2011-01-13 MED ORDER — LEVOFLOXACIN 500 MG PO TABS: 500.0000 mg | ORAL_TABLET | Freq: Every day | ORAL | Status: AC

## 2011-01-13 NOTE — Patient Instructions (Signed)
It was good to see you today. Levaquin antibiotics for sinus infection - also refills on nalsal steroid spray Your prescription(s) have been submitted to your pharmacy. Please take as directed and contact our office if you believe you are having problem(s) with the medication(s).

## 2011-01-13 NOTE — Assessment & Plan Note (Signed)
Continue OTC antihistamine and rx nasal steroid

## 2011-01-13 NOTE — Progress Notes (Signed)
  Subjective:     Alicia Chen is a 45 y.o. female who presents for evaluation of sinus pain. Located over right maxillary>frontal sinus and R face/ear. Symptoms include: facial pain, headaches, nasal congestion, post nasal drip and puffiness of the eyes. Onset of symptoms was 3 days ago. Symptoms have been rapidly worsening since that time. Past history is significant for occasional episodes of bronchitis. Patient is a non-smoker.  The following portions of the patient's history were reviewed and updated as appropriate: allergies, current medications, past medical history, past social history and problem list.  Review of Systems Constitutional: negative for fevers and weight loss Ears, nose, mouth, throat, and face: negative for epistaxis, facial trauma and sore mouth Respiratory: negative for dyspnea on exertion and wheezing Cardiovascular: negative for chest pain and lower extremity edema   Objective:    BP 118/82  Pulse 80  Temp(Src) 98.2 F (36.8 C) (Oral)  Ht 5\' 3"  (1.6 m)  SpO2 97% General appearance: alert and mild distress Head: Normocephalic, without obvious abnormality, atraumatic, sinuses tender to percussion Eyes: conjunctivae/corneas clear. PERRL, EOM's intact. Fundi benign. Ears: normal TM's and external ear canals both ears Nose: no discharge Throat: lips, mucosa, and tongue normal; teeth and gums normal Lungs: clear to auscultation bilaterally Heart: regular rate and rhythm, S1, S2 normal, no murmur, click, rub or gallop    Assessment:    Acute bacterial sinusitis.    Plan:    Nasal saline sprays. Nasal steroids per medication orders. Antihistamines per medication orders.

## 2011-03-29 ENCOUNTER — Ambulatory Visit: Payer: 59 | Admitting: Internal Medicine

## 2011-03-29 DIAGNOSIS — Z029 Encounter for administrative examinations, unspecified: Secondary | ICD-10-CM

## 2011-04-30 ENCOUNTER — Other Ambulatory Visit: Payer: Self-pay | Admitting: Internal Medicine

## 2011-05-10 ENCOUNTER — Encounter: Payer: Self-pay | Admitting: Internal Medicine

## 2011-05-10 ENCOUNTER — Ambulatory Visit (INDEPENDENT_AMBULATORY_CARE_PROVIDER_SITE_OTHER): Payer: 59 | Admitting: Internal Medicine

## 2011-05-10 ENCOUNTER — Encounter: Payer: Self-pay | Admitting: *Deleted

## 2011-05-10 VITALS — BP 120/92 | HR 80 | Temp 98.5°F | Wt 280.0 lb

## 2011-05-10 DIAGNOSIS — F3289 Other specified depressive episodes: Secondary | ICD-10-CM

## 2011-05-10 DIAGNOSIS — F419 Anxiety disorder, unspecified: Secondary | ICD-10-CM

## 2011-05-10 DIAGNOSIS — F329 Major depressive disorder, single episode, unspecified: Secondary | ICD-10-CM

## 2011-05-10 DIAGNOSIS — R0789 Other chest pain: Secondary | ICD-10-CM

## 2011-05-10 DIAGNOSIS — F411 Generalized anxiety disorder: Secondary | ICD-10-CM

## 2011-05-10 MED ORDER — SERTRALINE HCL 25 MG PO TABS: 25.0000 mg | ORAL_TABLET | Freq: Every day | ORAL | Status: DC

## 2011-05-10 MED ORDER — ALPRAZOLAM 0.5 MG PO TABS: 0.5000 mg | ORAL_TABLET | Freq: Three times a day (TID) | ORAL | Status: DC | PRN

## 2011-05-10 NOTE — Progress Notes (Signed)
  Subjective:    Patient ID: Alicia Chen, female    DOB: [birth year], 45 y.o.   MRN: [id number]  HPI  complains of chest pain  Onset 2 days ago Located anterior L side of chest Intermittent "fluttering" - last few minutes then resolves spontaneous associated with increased stress due to work  Past Medical History  Diagnosis Date  . ARTHRITIS   . MIGRAINE HEADACHE   . OBESITY, MORBID   . ALLERGIC RHINITIS   . ANEMIA-NOS   . ASTHMA   . GERD   . DEPRESSION     Review of Systems  Constitutional: Negative for fever and fatigue.  Respiratory: Negative for cough and wheezing.   Cardiovascular: Negative for leg swelling.       Objective:   Physical Exam BP 120/92  Pulse 80  Temp(Src) 98.5 F (36.9 C) (Oral)  Wt 280 lb (127.007 kg)  SpO2 99% Constitutional: She is overweight - appears well-developed and well-nourished. No distress.  Neck: Normal range of motion. Neck supple. No JVD present. No thyromegaly present.  Cardiovascular: Normal rate, regular rhythm and normal heart sounds.  No murmur heard. No BLE edema. Pulmonary/Chest: Effort normal and breath sounds normal. No respiratory distress. She has no wheezes. Neurological: She is alert and oriented to person, place, and time. No cranial nerve deficit. Coordination normal.  Psychiatric: She has a normal mood and affect. Her behavior is normal. Judgment and thought content normal.   Lab Results  Component Value Date   WBC 7.0 09/22/2010   HGB 12.3 09/22/2010   HCT 37.5 09/22/2010   PLT 284.0 09/22/2010   CHOL 199 09/22/2010   TRIG 105.0 09/22/2010   HDL 52.20 09/22/2010   ALT 15 09/22/2010   AST 14 09/22/2010   NA 139 09/22/2010   K 4.7 09/22/2010   CL 107 09/22/2010   CREATININE 1.1 09/22/2010   BUN 14 09/22/2010   CO2 27 09/22/2010   TSH 0.74 09/22/2010   EKG: NSR @ 77bpm - no ischemic change or arrthymia       Assessment & Plan:  chest pain, noncardiac - suspect anxiety and panic attacks given hx/stressors at  work Reviewed same with pt who agrees - will tx with prn BZ to help with same and insomnia - also offered counseling

## 2011-05-10 NOTE — Assessment & Plan Note (Signed)
Reminded to resume sertraline given serotonin imbalance with tendency toward anxiety - see above

## 2011-05-10 NOTE — Patient Instructions (Signed)
It was good to see you today. EKG/heart ok -  Use xanax as need for panic attack symptoms and sleep  - also resume sertraline - Your prescription(s) have been submitted to your pharmacy. Please take as directed and contact our office if you believe you are having problem(s) with the medication(s). Please schedule followup in 6 weeks to review symptoms and meds, call sooner if problems.

## 2011-05-16 LAB — BASIC METABOLIC PANEL
BUN: 8
CO2: 29
Calcium: 8.6
Creatinine, Ser: 1.45 — ABNORMAL HIGH
GFR calc non Af Amer: 40 — ABNORMAL LOW
Glucose, Bld: 98

## 2011-05-16 LAB — CBC
MCHC: 33
Platelets: 357
RDW: 16.4 — ABNORMAL HIGH

## 2011-05-16 LAB — POCT CARDIAC MARKERS
CKMB, poc: <1 — ABNORMAL LOW
Myoglobin, poc: 86.7
Operator id: 4533

## 2011-05-16 LAB — DIFFERENTIAL
Basophils Absolute: 0.1
Basophils Relative: 1
Monocytes Absolute: 0.5
Neutro Abs: 5.3
Neutrophils Relative %: 57

## 2011-05-22 LAB — URINALYSIS, ROUTINE W REFLEX MICROSCOPIC
Bilirubin Urine: NEGATIVE
Glucose, UA: NEGATIVE
Protein, ur: NEGATIVE

## 2011-05-22 LAB — URINE MICROSCOPIC-ADD ON

## 2011-06-22 ENCOUNTER — Ambulatory Visit: Payer: 59 | Admitting: Internal Medicine

## 2011-06-22 DIAGNOSIS — Z0289 Encounter for other administrative examinations: Secondary | ICD-10-CM

## 2011-07-06 ENCOUNTER — Encounter: Payer: Self-pay | Admitting: Internal Medicine

## 2011-07-06 ENCOUNTER — Encounter: Payer: Self-pay | Admitting: *Deleted

## 2011-07-06 ENCOUNTER — Ambulatory Visit (INDEPENDENT_AMBULATORY_CARE_PROVIDER_SITE_OTHER): Payer: 59 | Admitting: Internal Medicine

## 2011-07-06 VITALS — BP 120/82 | HR 81 | Temp 99.1°F

## 2011-07-06 DIAGNOSIS — R059 Cough, unspecified: Secondary | ICD-10-CM

## 2011-07-06 DIAGNOSIS — J45909 Unspecified asthma, uncomplicated: Secondary | ICD-10-CM

## 2011-07-06 DIAGNOSIS — R05 Cough: Secondary | ICD-10-CM

## 2011-07-06 DIAGNOSIS — F3289 Other specified depressive episodes: Secondary | ICD-10-CM

## 2011-07-06 DIAGNOSIS — J45901 Unspecified asthma with (acute) exacerbation: Secondary | ICD-10-CM

## 2011-07-06 DIAGNOSIS — F329 Major depressive disorder, single episode, unspecified: Secondary | ICD-10-CM

## 2011-07-06 MED ORDER — PREDNISONE (PAK) 10 MG PO TABS: 10.0000 mg | ORAL_TABLET | ORAL | Status: DC

## 2011-07-06 MED ORDER — LEVOFLOXACIN 500 MG PO TABS: 500.0000 mg | ORAL_TABLET | Freq: Every day | ORAL | Status: DC

## 2011-07-06 MED ORDER — PROMETHAZINE-CODEINE 6.25-10 MG/5ML PO SYRP: 5.0000 mL | ORAL_SOLUTION | ORAL | Status: DC | PRN

## 2011-07-06 MED ORDER — ALBUTEROL SULFATE HFA 108 (90 BASE) MCG/ACT IN AERS: 2.0000 | INHALATION_SPRAY | RESPIRATORY_TRACT | Status: DC | PRN

## 2011-07-06 NOTE — Assessment & Plan Note (Signed)
ongoing sertraline daily since panic attack/flares 05/2011 symptoms improved - continue same with prn alprazolam

## 2011-07-06 NOTE — Patient Instructions (Addendum)
It was good to see you today. We will treat her asthmatic bronchitis with Levaquin antibiotics daily x1 week, prednisone pack taper for cough and wheeze as well as codeine cough syrup when needed Your prescription(s) have been submitted to your pharmacy. Please take as directed and contact our office if you believe you are having problem(s) with the medication(s). If symptoms unimproved in next 10-14 days, call for reevaluation as needed, call sooner if problems worse Work note provided as requested for today and tomorrow

## 2011-07-06 NOTE — Progress Notes (Signed)
  Subjective:    Patient ID: Alicia Chen, female    DOB: 01/27/1966, 46 y.o.   MRN: [id number]  Cough This is a new problem. The current episode started in the past 7 days. The problem has been gradually worsening. The problem occurs every few minutes. The cough is productive of sputum. Associated symptoms include chills, myalgias, a sore throat and wheezing. Pertinent negatives include no ear pain, fever, hemoptysis, nasal congestion, postnasal drip or shortness of breath.     Past Medical History  Diagnosis Date  . ARTHRITIS   . MIGRAINE HEADACHE   . OBESITY, MORBID   . ALLERGIC RHINITIS   . ANEMIA-NOS   . ASTHMA   . GERD   . DEPRESSION     Review of Systems  Constitutional: Positive for chills. Negative for fever and fatigue.  HENT: Positive for sore throat. Negative for ear pain and postnasal drip.   Respiratory: Positive for cough and wheezing. Negative for hemoptysis and shortness of breath.   Cardiovascular: Negative for leg swelling.  Musculoskeletal: Positive for myalgias.       Objective:   Physical Exam  BP 120/82  Pulse 81  Temp(Src) 99.1 F (37.3 C) (Oral)  SpO2 94% Wt Readings from Last 3 Encounters:  05/10/11 280 lb (127.007 kg)  09/26/10 269 lb (122.018 kg)  08/15/10 264 lb (119.75 kg)   Constitutional: She is overweight - appears well-developed and well-nourished. Mildly ill.  Neck: Normal range of motion. Neck supple. No JVD present. No thyromegaly present.  Cardiovascular: Normal rate, regular rhythm and normal heart sounds.  No murmur heard. No BLE edema. Pulmonary/Chest: Effort normal and breath sounds rhonchi but no respiratory distress. She has mild end exp wheezes. Neurological: She is alert and oriented to person, place, and time. No cranial nerve deficit. Coordination normal.  Psychiatric: She has a normal mood and affect. Her behavior is normal. Judgment and thought content normal.   Lab Results  Component Value Date   WBC 7.0 09/22/2010   HGB 12.3 09/22/2010   HCT 37.5 09/22/2010   PLT 284.0 09/22/2010   CHOL 199 09/22/2010   TRIG 105.0 09/22/2010   HDL 52.20 09/22/2010   ALT 15 09/22/2010   AST 14 09/22/2010   NA 139 09/22/2010   K 4.7 09/22/2010   CL 107 09/22/2010   CREATININE 1.1 09/22/2010   BUN 14 09/22/2010   CO2 27 09/22/2010   TSH 0.74 09/22/2010       Assessment & Plan:   Asthmatic bronchitis Cough due to above  Levaquin, prednisone pack and codeine cough syrup Work note for today and tomorrow Refill on albuterol MDI

## 2011-07-06 NOTE — Assessment & Plan Note (Signed)
Mild flare as described above due to acute infection Refill albuterol

## 2011-07-13 ENCOUNTER — Other Ambulatory Visit: Payer: Self-pay | Admitting: Internal Medicine

## 2011-07-13 NOTE — Telephone Encounter (Signed)
Faxed script back to [facility]...07/13/11@ 2:17pm/LMB

## 2011-11-02 ENCOUNTER — Ambulatory Visit (INDEPENDENT_AMBULATORY_CARE_PROVIDER_SITE_OTHER): Payer: 59 | Admitting: Internal Medicine

## 2011-11-02 ENCOUNTER — Encounter: Payer: Self-pay | Admitting: Internal Medicine

## 2011-11-02 ENCOUNTER — Telehealth: Payer: Self-pay | Admitting: *Deleted

## 2011-11-02 ENCOUNTER — Encounter: Payer: Self-pay | Admitting: *Deleted

## 2011-11-02 VITALS — BP 104/82 | HR 88 | Temp 98.0°F

## 2011-11-02 DIAGNOSIS — F3289 Other specified depressive episodes: Secondary | ICD-10-CM

## 2011-11-02 DIAGNOSIS — J45909 Unspecified asthma, uncomplicated: Secondary | ICD-10-CM

## 2011-11-02 DIAGNOSIS — F329 Major depressive disorder, single episode, unspecified: Secondary | ICD-10-CM

## 2011-11-02 DIAGNOSIS — J309 Allergic rhinitis, unspecified: Secondary | ICD-10-CM

## 2011-11-02 DIAGNOSIS — Z0000 Encounter for general adult medical examination without abnormal findings: Secondary | ICD-10-CM

## 2011-11-02 MED ORDER — ALPRAZOLAM 0.5 MG PO TABS: 0.5000 mg | ORAL_TABLET | Freq: Three times a day (TID) | ORAL | Status: DC | PRN

## 2011-11-02 MED ORDER — PROMETHAZINE-CODEINE 6.25-10 MG/5ML PO SYRP: 5.0000 mL | ORAL_SOLUTION | ORAL | Status: AC | PRN

## 2011-11-02 MED ORDER — LEVOFLOXACIN 500 MG PO TABS: 500.0000 mg | ORAL_TABLET | Freq: Every day | ORAL | Status: AC

## 2011-11-02 MED ORDER — PREDNISONE (PAK) 10 MG PO TABS: 10.0000 mg | ORAL_TABLET | ORAL | Status: AC

## 2011-11-02 MED ORDER — MECLIZINE HCL 25 MG PO TABS: 25.0000 mg | ORAL_TABLET | Freq: Three times a day (TID) | ORAL | Status: DC | PRN

## 2011-11-02 NOTE — Progress Notes (Signed)
  Subjective:    Patient ID: Alicia Chen, female    DOB: October 16, 1965, 46 y.o.   MRN: [id number]  Cough This is a new problem. The current episode started in the past 7 days. The problem has been gradually worsening. The problem occurs constantly. The cough is productive of sputum. Associated symptoms include chills, ear pain (right), nasal congestion, postnasal drip, shortness of breath and wheezing. Pertinent negatives include no chest pain, fever, headaches, hemoptysis, myalgias or sore throat. The symptoms are aggravated by pollens. She has tried a beta-agonist inhaler for the symptoms. The treatment provided mild relief. Her past medical history is significant for asthma.     Past Medical History  Diagnosis Date  . ARTHRITIS   . MIGRAINE HEADACHE   . OBESITY, MORBID   . ALLERGIC RHINITIS   . ANEMIA-NOS   . ASTHMA   . GERD   . DEPRESSION     Review of Systems  Constitutional: Positive for chills. Negative for fever and fatigue.  HENT: Positive for ear pain (right) and postnasal drip. Negative for sore throat.   Respiratory: Positive for cough, shortness of breath and wheezing. Negative for hemoptysis.   Cardiovascular: Negative for chest pain and leg swelling.  Musculoskeletal: Negative for myalgias.  Neurological: Negative for headaches.       Objective:   Physical Exam  BP 104/82  Pulse 88  Temp(Src) 98 F (36.7 C) (Oral)  SpO2 95% Wt Readings from Last 3 Encounters:  05/10/11 280 lb (127.007 kg)  09/26/10 269 lb (122.018 kg)  08/15/10 264 lb (119.75 kg)   Constitutional: She is overweight - appears well-developed and well-nourished. Mildly ill.  HENT: mild B maxillary tenderness - nares with yellow discharge, no swelling - OP with mild erythema and PND, no exudate - TMs with cler effusion R>L Neck: Normal range of motion. Neck supple. No JVD present. No thyromegaly present.  Cardiovascular: Normal rate, regular rhythm and normal heart sounds.  No murmur heard. No  BLE edema. Pulmonary/Chest: breath sounds with scattered rhonchi but no respiratory distress. She has few end exp wheezes. Neurological: She is alert and oriented to person, place, and time. No cranial nerve deficit. Coordination normal.  Psychiatric: She has a normal mood and affect. Her behavior is normal. Judgment and thought content normal.   Lab Results  Component Value Date   WBC 7.0 09/22/2010   HGB 12.3 09/22/2010   HCT 37.5 09/22/2010   PLT 284.0 09/22/2010   CHOL 199 09/22/2010   TRIG 105.0 09/22/2010   HDL 52.20 09/22/2010   ALT 15 09/22/2010   AST 14 09/22/2010   NA 139 09/22/2010   K 4.7 09/22/2010   CL 107 09/22/2010   CREATININE 1.1 09/22/2010   BUN 14 09/22/2010   CO2 27 09/22/2010   TSH 0.74 09/22/2010       Assessment & Plan:   Asthmatic bronchitis, acute exac -  Levaquin, prednisone pack and codeine cough syrup Work note for today  Continue rescue albuterol MDI as needed

## 2011-11-02 NOTE — Telephone Encounter (Signed)
Received staff msg pt made cpx appt for June need labs entered in EPIC... 11/02/11@11 :26am/LMB

## 2011-11-02 NOTE — Telephone Encounter (Signed)
Message copied by Deatra James on Thu Nov 02, 2011 11:26 AM ------      Message from: COUSIN, SHARON T      Created: Thu Nov 02, 2011  9:39 AM      Regarding: PHY DATE:   02/01/12       THANKS

## 2011-11-02 NOTE — Assessment & Plan Note (Signed)
Mild flare as described above due to acute infection pred taper and continue prn albuterol

## 2011-11-02 NOTE — Patient Instructions (Signed)
It was good to see you today. We will treat your asthmatic bronchitis with Levaquin antibiotics daily x1 week, prednisone pack taper x 6days for cough and wheeze as well as codeine cough syrup when needed Your prescription(s) have been submitted to your pharmacy. Please take as directed and contact our office if you believe you are having problem(s) with the medication(s). Other Medications reviewed, no changes at this time. Refill on medication(s) as discussed today.  If symptoms unimproved in next 10-14 days, call for reevaluation as needed, call sooner if problems worse Work note provided as requested for today and tomorrow Please schedule followup in 3-4 months for physical and labs, call sooner if problems.

## 2011-11-02 NOTE — Assessment & Plan Note (Signed)
ongoing sertraline daily since panic attack/flares 05/2011 symptoms improved - continue same with prn alprazolam - refill today

## 2011-11-02 NOTE — Assessment & Plan Note (Signed)
Continue OTC antihistamine and rx nasal steroid 

## 2011-11-13 ENCOUNTER — Telehealth: Payer: Self-pay

## 2011-11-13 MED ORDER — FLUCONAZOLE 150 MG PO TABS: 150.0000 mg | ORAL_TABLET | Freq: Once | ORAL | Status: AC

## 2011-11-13 NOTE — Telephone Encounter (Signed)
Pt called requesting Rx for Diflucan to treat yeast infection after ABX course.

## 2011-11-13 NOTE — Telephone Encounter (Signed)
Pt advised of Rx/pharmacy via VM 

## 2011-11-13 NOTE — Telephone Encounter (Signed)
ok 

## 2012-01-25 ENCOUNTER — Other Ambulatory Visit: Payer: Self-pay | Admitting: Obstetrics & Gynecology

## 2012-01-25 ENCOUNTER — Other Ambulatory Visit (INDEPENDENT_AMBULATORY_CARE_PROVIDER_SITE_OTHER): Payer: 59

## 2012-01-25 ENCOUNTER — Ambulatory Visit (INDEPENDENT_AMBULATORY_CARE_PROVIDER_SITE_OTHER): Payer: 59 | Admitting: Internal Medicine

## 2012-01-25 ENCOUNTER — Encounter: Payer: Self-pay | Admitting: Internal Medicine

## 2012-01-25 VITALS — BP 112/76 | HR 72 | Temp 98.1°F | Ht 63.0 in | Wt 284.0 lb

## 2012-01-25 DIAGNOSIS — Z0000 Encounter for general adult medical examination without abnormal findings: Secondary | ICD-10-CM

## 2012-01-25 DIAGNOSIS — F329 Major depressive disorder, single episode, unspecified: Secondary | ICD-10-CM

## 2012-01-25 DIAGNOSIS — R209 Unspecified disturbances of skin sensation: Secondary | ICD-10-CM

## 2012-01-25 DIAGNOSIS — R200 Anesthesia of skin: Secondary | ICD-10-CM

## 2012-01-25 DIAGNOSIS — F3289 Other specified depressive episodes: Secondary | ICD-10-CM

## 2012-01-25 DIAGNOSIS — Z1231 Encounter for screening mammogram for malignant neoplasm of breast: Secondary | ICD-10-CM

## 2012-01-25 LAB — CBC WITH DIFFERENTIAL/PLATELET
Eosinophils Relative: 2.3 % (ref 0.0–5.0)
Monocytes Relative: 6 % (ref 3.0–12.0)
Neutrophils Relative %: 62.7 % (ref 43.0–77.0)
Platelets: 353 10*3/uL (ref 150.0–400.0)
WBC: 6.9 10*3/uL (ref 4.5–10.5)

## 2012-01-25 LAB — BASIC METABOLIC PANEL
CO2: 27 mEq/L (ref 19–32)
Chloride: 106 mEq/L (ref 96–112)
Glucose, Bld: 81 mg/dL (ref 70–99)
Potassium: 4.4 mEq/L (ref 3.5–5.1)
Sodium: 138 mEq/L (ref 135–145)

## 2012-01-25 LAB — URINALYSIS, ROUTINE W REFLEX MICROSCOPIC
Specific Gravity, Urine: 1.01 (ref 1.000–1.030)
Total Protein, Urine: NEGATIVE
Urine Glucose: NEGATIVE
Urobilinogen, UA: 0.2 (ref 0.0–1.0)

## 2012-01-25 LAB — LIPID PANEL
Cholesterol: 216 mg/dL — ABNORMAL HIGH (ref 0–200)
Total CHOL/HDL Ratio: 4
Triglycerides: 91 mg/dL (ref 0.0–149.0)

## 2012-01-25 LAB — HEPATIC FUNCTION PANEL
ALT: 20 U/L (ref 0–35)
AST: 16 U/L (ref 0–37)
Albumin: 3.5 g/dL (ref 3.5–5.2)

## 2012-01-25 LAB — LDL CHOLESTEROL, DIRECT: Direct LDL: 149.5 mg/dL

## 2012-01-25 MED ORDER — HYDROCHLOROTHIAZIDE 25 MG PO TABS: 12.5000 mg | ORAL_TABLET | Freq: Every day | ORAL | Status: DC

## 2012-01-25 MED ORDER — SERTRALINE HCL 50 MG PO TABS: 50.0000 mg | ORAL_TABLET | Freq: Every day | ORAL | Status: DC

## 2012-01-25 MED ORDER — CYCLOBENZAPRINE HCL 5 MG PO TABS: 5.0000 mg | ORAL_TABLET | Freq: Two times a day (BID) | ORAL | Status: DC | PRN

## 2012-01-25 NOTE — Assessment & Plan Note (Signed)
Wt Readings from Last 3 Encounters:  01/25/12 284 lb (128.822 kg)  05/10/11 280 lb (127.007 kg)  09/26/10 269 lb (122.018 kg)   The patient is asked to make an attempt to improve diet and exercise patterns to aid in medical management of this problem. Pt will investigate possible bariatric options - i will medically support her efforts on this as needed

## 2012-01-25 NOTE — Assessment & Plan Note (Signed)
ongoing sertraline since panic attack/flares 05/2011 Symptoms initially improved, will increase dose now continue prn alprazolam - refill today

## 2012-01-25 NOTE — Progress Notes (Signed)
Subjective:    Patient ID: Alicia Chen, female    DOB: 03-Jul-1966, 46 y.o.   MRN: [id number]  HPI patient is here today for annual physical. Patient feels well overall.  Increasing depression symptoms -  Also dependent edema at end of day, worse in summer Also RUE ache and numbness - ongoing >6 mo with increase computer work - no injury - neck tight, no weakness   Past Medical History  Diagnosis Date  . ARTHRITIS   . MIGRAINE HEADACHE   . OBESITY, MORBID   . ALLERGIC RHINITIS   . ANEMIA-NOS   . ASTHMA   . GERD   . DEPRESSION    Family History  Problem Relation Age of Onset  . Arthritis Mother   . Arthritis Father   . Alcohol abuse Other     Parents  . Arthritis Other     grandparents  . Diabetes Other     Parent & grandparent  . Hyperlipidemia Other     Parent, grandparent, other relative  . Hypertension Other     parent, grandparent, other relative  . Prostate cancer Other     grandfather   History  Substance Use Topics  . Smoking status: Never Smoker   . 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 Alcohol Use: Yes     Social    Review of Systems Constitutional: Negative for fever or weight change.  Respiratory: Negative for cough and shortness of breath.   Cardiovascular: Negative for chest pain or palpitations.  Gastrointestinal: Negative for abdominal pain, no bowel changes.  Musculoskeletal: Negative for gait problem or joint swelling.  Skin: Negative for rash.  Neurological: Negative for dizziness or headache.  No other specific complaints in a complete review of systems (except as listed in HPI above).     Objective:   Physical Exam BP 112/76  Pulse 72  Temp 98.1 F (36.7 C) (Oral)  Ht 5\' 3"  (1.6 m)  Wt 284 lb (128.822 kg)  BMI 50.31 kg/m2  SpO2 99% Wt Readings from Last 3 Encounters:  01/25/12 284 lb (128.822 kg)  05/10/11 280 lb (127.007 kg)  09/26/10 269  lb (122.018 kg)   Constitutional: She is obese, but appears well-developed and well-nourished. No distress.  HENT: Head: Normocephalic and atraumatic. Ears: B TMs ok, no erythema or effusion; Nose: Nose normal. Mouth/Throat: Oropharynx is clear and moist. No oropharyngeal exudate.  Eyes: Conjunctivae and EOM are normal. Pupils are equal, round, and reactive to light. No scleral icterus.  Neck: Normal range of motion. Neck supple. No JVD present. No thyromegaly present.  Cardiovascular: Normal rate, regular rhythm and normal heart sounds.  No murmur heard. trace dep BLE edema. Pulmonary/Chest: Effort normal and breath sounds normal. No respiratory distress. She has no wheezes.  Abdominal: Soft. Bowel sounds are normal. She exhibits no distension. There is no tenderness. no masses GU: defer to gyn Musculoskeletal: Normal range of motion, no joint effusions. No gross deformities Neurological: She is alert and oriented to person, place, and time. No cranial nerve deficit. Coordination normal.  Skin: Skin is warm and dry. No rash noted. No erythema.  Psychiatric: She has a dysphoric, tired mood and affect. Her behavior is normal. Judgment and thought content normal.   Lab Results  Component Value Date   WBC 7.0 09/22/2010   HGB 12.3 09/22/2010   HCT 37.5 09/22/2010   PLT 284.0 09/22/2010   GLUCOSE 77 09/22/2010  CHOL 199 09/22/2010   TRIG 105.0 09/22/2010   HDL 52.20 09/22/2010   LDLCALC 126* 09/22/2010   ALT 15 09/22/2010   AST 14 09/22/2010   NA 139 09/22/2010   K 4.7 09/22/2010   CL 107 09/22/2010   CREATININE 1.1 09/22/2010   BUN 14 09/22/2010   CO2 27 09/22/2010   TSH 0.74 09/22/2010    ECG: sinus @ 77bpm - low voltage, no ischemic changes    Assessment & Plan:  CPX/v70.0 - Patient has been counseled on age-appropriate routine health concerns for screening and prevention. These are reviewed and up-to-date. Immunizations are up-to-date or declined. Labs pending, will be reviewed and ECG  reviewed.  Also See problem list. Medications and labs reviewed today.  RUE numbness and "ache", ongoing >57mo. symptoms worse with activity and certain positions (lying down) - ?cervical source - MSkel shoulder/elbow/wrist exam benign - check PNCS and use muscle relaxer as needed

## 2012-01-25 NOTE — Patient Instructions (Signed)
It was good to see you today. Test(s) ordered today. Your results will be called to you after review (48-72hours after test completion). If any changes need to be made, you will be notified at that time. Health Maintenance reviewed - all recommended immunizations and age-appropriate screenings are up-to-date. Increase sertraline dose - also use flexeril at night as needed for neck/right arm pain -  Your prescription(s) have been submitted to your pharmacy. Please take as directed and contact our office if you believe you are having problem(s) with the medication(s). we'll make referral to nerve conduction test to evaluate cause of right arm pain. Our office will contact you regarding appointment(s) once made. Please schedule followup in 3-4 months follow up weight and depression, call sooner if problems.  Bariatric Surgery (Gastrointestinal Surgery for Severe Obesity) Severe obesity is a longstanding condition. It is difficult to treat through diet and exercise alone. Gastrointestinal surgery is the best option for people who are severely obese and cannot lose weight by traditional means, or who suffer from serious obesity-related health problems. The surgery promotes weight loss by decreasing the absorption of food and, in some operations, interrupting the digestive process. As in other treatments for obesity, the best results are achieved with healthy eating behaviors and regular physical activity.   People who may consider gastrointestinal surgery include those with a body mass index (BMI) above 40. This is about 100 pounds of overweight for men and 80 pounds for women. People with a BMI between 35 and 40 and who suffer from type 2 diabetes or life-threatening cardiopulmonary (heart and lung) problems, such as severe sleep apnea or obesity-related heart disease, may also be candidates for surgery. (To use the Body Mass Index chart. find your weight on the bottom of the graph. Go straight up from that  point until you come to the line that matches your height. Then look to find your weight group). The idea of gastrointestinal surgery to control obesity grew out of results of operations for cancer or severe ulcers that removed large portions of the stomach or small intestine. Patients undergoing these procedures tended to lose weight after surgery. So some physicians began to use such operations to treat severe obesity. The first operation that was widely used for severe obesity was the intestinal bypass. This operation was first used 40 years ago. It produced weight loss by causing malabsorption. The idea was that patients could eat large amounts of food, which would be poorly digested or passed along too fast for the body to absorb many calories. The problem with this surgery was that it caused a loss of essential nutrients. Also, its side effects were unpredictable and sometimes fatal. The original form of the intestinal bypass operation is no longer used. THE NORMAL DIGESTIVE PROCESS Normally, as food moves along the digestive tract, digestive juices and enzymes digest and absorb calories and nutrients. After we chew and swallow our food, it moves down the esophagus to the stomach. There a strong acid continues the digestive process. The stomach can hold about 3 pints of food at one time. When the stomach contents move to the first portion of the small intestine (duodenum ), bile and pancreatic juice speed up digestion. Most of the iron and calcium in the foods we eat is absorbed in the duodenum. The jejunum and ileum are the remaining two segments of the nearly 20 feet of small intestine. They complete the absorption of almost all calories and nutrients. The food particles that cannot be digested  in the small intestine are stored in the large intestine until eliminated.   HOW DOES SURGERY PROMOTE WEIGHT LOSS? Gastrointestinal surgery for obesity is also called bariatric surgery. It alters the digestive  process. The operations promote weight loss by closing off parts of the stomach. This will make it smaller. Operations that only reduce stomach size are known as "restrictive operations". They restrict the amount of food the stomach can hold. Some operations combine stomach restriction with a partial bypass of the small intestine. These procedures create a direct connection from the stomach to the lower segment of the small intestine. This causes bypassing portions of the digestive tract that absorb calories and nutrients. These are known as malabsorptive operations. WHAT ARE THE SURGICAL OPTIONS? There are several types of restrictive and malabsorptive operations. Each one carries its own benefits and risks.   Restrictive Operations  Restrictive operations serve only to restrict food intake. They do not interfere with the normal digestive process. To perform the surgery, doctors create a small pouch at the top of the stomach where food enters from the esophagus. At first, the pouch holds about 1 ounce of food. It later expands to 2-3 ounces. The lower outlet of the pouch usually has a diameter of only about  inch. This small outlet delays the emptying of food from the pouch and causes a feeling of fullness. As a result of this surgery, most people lose the ability to eat large amounts of food at one time. After an operation, the person usually can eat only  to 1 cup of food without discomfort or nausea. Also, food has to be well chewed. Restrictive operations for obesity include adjustable gastric banding (AGB) and vertical banded gastroplasty (VBG).   Adjustable gastric banding  In this procedure, a hollow band made of special material is placed around the stomach near its upper end. This creates a small pouch and a narrow passage into the larger remainder of the stomach. The band is then inflated with a salt solution. It can be tightened or loosened over time to change the size of the passage by  increasing or decreasing the amount of salt solution.   The band is adjusted based on feelings of hunger and weight loss. Patients decide when they need an adjustment and come to their surgeons to evaluate this. The adjustment is done as an office visit. The band is fully reversible with a second surgery if the patient changes his/her mind. There is no cutting or re-routing of the intestine.   Vertical banded gastroplasty  VBG has been the most common restrictive operation for weight control. Both a band and staples are used to create a small stomach pouch. Vertical banded gastroplasty is based on the same principle of restriction as the band. But the stomach is surgically altered with the stapling. This treatment is not reversible.   Restrictive operations lead to weight loss in almost all patients. But they are less successful than malabsorptive operations in achieving substantial, long-term weight loss. About 30 percent of those who undergo VBG achieve normal weight. About 80 percent achieve some degree of weight loss. Some patients regain weight. Others are unable to adjust their eating habits and fail to lose the desired weight. Successful results depend on the patient's willingness to adopt a long-term plan of healthy eating and regular physical activity.   A common risk of restrictive operations is vomiting. This is caused when the small stomach is overly stretched by food particles that have not been  chewed well. Band slippage and saline leakage have been reported after AGB. Risks of VBG include wearing away of the band and breakdown of the staple line. In a small number of cases, stomach juices may leak into the abdomen. This requires an emergency operation. In less than 1 percent of all cases, infection or death from complications may occur.  Malabsorptive Operations  Malabsorptive operations are the most common gastrointestinal surgeries for weight loss. They restrict both food intake and the  amount of calories and nutrients the body absorbs.   Roux-en-Y gastric bypass (RGB)  This operation is the most common and successful malabsorptive surgery. First, a small stomach pouch is created to restrict food intake. Next, a Y-shaped section of the small intestine is attached to the pouch. This allows food to bypass the lower stomach, the first segment of the small intestine (duodenum), and the first portion of the jejunum (the second segment of the small intestine). This bypass reduces the amount of calories and nutrients the body absorbs.   Biliopancreatic diversion (BPD)  In this more complicated malabsorptive operation, portions of the stomach are removed. The small pouch that remains is connected directly to the final segment of the small intestine, completely bypassing the duodenum and the jejunum. This procedure successfully promotes weight loss. But it is less frequently used than other types of surgery because of the high risk for nutritional deficiencies. A variation of BPD includes a "duodenal switch". This leaves a larger portion of the stomach intact, including the pyloric valve. This valve regulates the release of stomach contents into the small intestine. It also keeps a small part of the duodenum in the digestive pathway.   Malabsorptive operations produce more weight loss than restrictive operations. And they are more effective in reversing the health problems associated with severe obesity. Patients who have malabsorptive operations generally lose two-thirds of their excess weight within 2 years.   In addition to the risks of restrictive surgeries, malabsorptive operations also carry greater risk for nutritional deficiencies. This is because the procedure causes food to bypass the duodenum and jejunum. That is where most iron and calcium are absorbed. Menstruating women may develop anemia because not enough vitamin B12 and iron are absorbed. Decreased absorption of calcium may also  bring on osteoporosis and metabolic bone disease. Patients are required to take nutritional supplements that usually prevent these deficiencies. Patients who have the biliopancreatic diversion surgery must also take fat-soluble (dissolved by fat) vitamins A, D, E, and K supplements.   RGB and BPD operations may also cause "dumping syndrome". This means that stomach contents move too rapidly through the small intestine. Symptoms include nausea, weakness, sweating, faintness, and sometimes diarrhea after eating. The duodenal switch operation keeps the pyloric valve intact. So it may reduce the likelihood of dumping syndrome.   The more extensive the bypass, the greater the risk is for complications and nutritional deficiencies. Patients with extensive bypasses of the normal digestive process require close monitoring. They also need life-long use of special foods, supplements, and medications.  EXPLORE BENEFITS AND RISKS Surgery to produce weight loss is a serious undertaking. Anyone thinking about surgery should understand what the operation involves. Patients and physicians should carefully consider the following benefits and risks.   Benefits  Right after surgery, most patients lose weight quickly. They continue to lose for 18 to 24 months after the procedure. Most patients regain 5 to 10 percent of the weight they lost. But many maintain a long-term weight loss of about  100 pounds.   Surgery improves most obesity-related conditions. For example, in one study blood sugar levels of 83 percent of obese patients with diabetes returned to normal after surgery. Nearly all patients whose blood sugar levels did not return to normal were older. Or they had lived with diabetes for a long time.  Risks  Ten to 20 percent of patients who have weight-loss surgery require follow-up operations to correct complications. Abdominal hernia was the most common complication requiring follow-up surgery. But laparoscopic  techniques seem to have solved this problem. In laparoscopy, the surgeon makes one or more small incisions. Slender surgical instruments are passed them. This technique eliminates the need for a large incision. And it creates less tissue damage. Patients who are super obese (greater than 350 pounds) or have had previous abdominal surgery, may not be good candidates for laparoscopy. Less common complications include breakdown of the staple line and stretched stomach outlets.   Some obese patients who have weight-loss surgery develop gallstones. These are clumps of cholesterol and other matter that form in the gallbladder. During quick or substantial weight loss, one's risk of developing gallstones increases. Taking supplemental bile salts for the first 6 months after surgery can prevent them.   Nearly 30 percent of patients who have weight-loss surgery develop nutritional deficiencies. These include anemia, osteoporosis, and metabolic bone disease. These usually can be avoided if vitamin and mineral intakes are high enough.   Women of childbearing age should avoid pregnancy until their weight becomes stable. Quick weight loss and nutritional deficiencies can harm a growing fetus.   Other risks of restrictive surgeries include:   Band slippage.   Stomach prolapse.   Band erosion into the lumen of the stomach.   Port infection.   The main risk with malabsorption operations is life threatening. It is the risk of leak from any of the anastomosis. The more involved the operation, the more risk involved.   There is one other risk of having the surgery. If people do not follow a strict diet, they will stretch out their stomach pouches. Then they will not lose weight.  MEDICAL COSTS Gastrointestinal surgery costs vary. They depend on the procedure. Medical insurance coverage varies by state and insurance provider. If you are considering gastrointestinal surgery, contact your r egional Medicare or  Medicaid office or your insurance plan. Find out from them if the procedure is covered. IS THE SURGERY FOR YOU?  Gastrointestinal surgery may be the next step for people who remain severely obese after trying nonsurgical approaches or have an obesity-related disease. Candidates for surgery have:   A BMI of 40 or more.   A BMI of 35 or more and a life-threatening obesity-related health problem such as:   Diabetes.   Severe sleep apnea.   Heart disease.   Obesity-related physical problems that interfere with:   Employment.   Walking.   Family function.  If you fit the profile for surgery, answers to these questions may help you decide whether weight-loss surgery is appropriate for you. Are you:  Unlikely to lose weight successfully without surgery?   Well informed about the surgical procedure? The effects of treatment?   Determined to lose weight? Improve your health?   Aware of how your life may change after the operation? Adjustment to the side effects of the surgery include the need to chew well and being unable to eat large meals.   Aware of the potential for serious complications? Dietary restrictions? Occasional failures?   Committed  to lifelong medical follow-up?   Restrictive operations are very successful with patients who follow a diet created by a dietician. Support groups and follow up with caregivers is important.  Remember: There are no guarantees for any method to produce and maintain weight loss. This includes surgery. Success is possible only with:  Maximum cooperation.   Commitment to behavioral change.   Medical follow-up.  This cooperation and commitment must be carried out for the rest of your life.   ADDITIONAL RESOURCES [employer] [address], [address] [place], [place] [address] www.asmbs.org   Weight-control Information Network (WIN) [address] [place]  [id number] [employer] Document Released: 07/24/2005 Document Revised: 07/13/2011 Document Reviewed: 10/17/2006 [facility] Patient Information 2012 [place], [place].

## 2012-01-26 ENCOUNTER — Ambulatory Visit
Admission: RE | Admit: 2012-01-26 | Discharge: 2012-01-26 | Disposition: A | Discharge status: Home / Self Care | Payer: 59 | Source: Ambulatory Visit | Attending: Obstetrics & Gynecology | Admitting: Obstetrics & Gynecology

## 2012-01-26 DIAGNOSIS — Z1231 Encounter for screening mammogram for malignant neoplasm of breast: Secondary | ICD-10-CM

## 2012-02-01 ENCOUNTER — Encounter: Payer: 59 | Admitting: Internal Medicine

## 2012-02-13 ENCOUNTER — Telehealth: Payer: Self-pay | Admitting: *Deleted

## 2012-02-13 NOTE — Telephone Encounter (Signed)
MD received results back from nerve conduction test. Wanted to inform pt that it showed mild (R) carpal tunnel syndrome. If pain continue wih hand sxs will refer to hand specialist for eval/treat. Called pt no answer left msg on vm md response... 02/13/12@11 :51am/LMB

## 2012-02-19 ENCOUNTER — Encounter: Payer: Self-pay | Admitting: Internal Medicine

## 2012-03-01 ENCOUNTER — Other Ambulatory Visit: Payer: Self-pay

## 2012-03-01 MED ORDER — DEXLANSOPRAZOLE 60 MG PO CPDR: 60.0000 mg | DELAYED_RELEASE_CAPSULE | Freq: Every day | ORAL | Status: DC

## 2012-03-13 ENCOUNTER — Encounter: Payer: Self-pay | Admitting: Internal Medicine

## 2012-03-13 ENCOUNTER — Ambulatory Visit (INDEPENDENT_AMBULATORY_CARE_PROVIDER_SITE_OTHER): Payer: 59 | Admitting: Internal Medicine

## 2012-03-13 VITALS — BP 110/82 | HR 102 | Temp 98.5°F

## 2012-03-13 DIAGNOSIS — T148XXA Other injury of unspecified body region, initial encounter: Secondary | ICD-10-CM

## 2012-03-13 DIAGNOSIS — L089 Local infection of the skin and subcutaneous tissue, unspecified: Secondary | ICD-10-CM

## 2012-03-13 MED ORDER — SULFAMETHOXAZOLE-TRIMETHOPRIM 800-160 MG PO TABS: 1.0000 | ORAL_TABLET | Freq: Two times a day (BID) | ORAL | Status: AC

## 2012-03-13 NOTE — Progress Notes (Signed)
  Subjective:    Patient ID: Alicia Chen, female    DOB: 1966-06-15, 46 y.o.   MRN: [id number]  HPI  complains of infected insect bite Located on RLE Precipitated by bite weeks ago but red and hot and swollen in last 24h  Past Medical History  Diagnosis Date  . ARTHRITIS   . MIGRAINE HEADACHE   . OBESITY, MORBID   . ALLERGIC RHINITIS   . ANEMIA-NOS   . ASTHMA   . GERD   . DEPRESSION    Review of Systems  Constitutional: Positive for chills. Negative for fever, diaphoresis and fatigue.  Respiratory: Negative for cough and shortness of breath.   Neurological: Negative for headaches.       Objective:   Physical Exam BP 110/82  Pulse 102  Temp 98.5 F (36.9 C) (Oral)  SpO2 97% Wt Readings from Last 3 Encounters:  01/25/12 284 lb (128.822 kg)  05/10/11 280 lb (127.007 kg)  09/26/10 269 lb (122.018 kg)   Constitutional: She is overweight, but appears well-developed and well-nourished. No distress. nontoxic Neck: Normal range of motion. Neck supple. No JVD present. No thyromegaly present.  Cardiovascular: Normal rate, regular rhythm and normal heart sounds.  No murmur heard. No BLE edema. Pulmonary/Chest: Effort normal and breath sounds normal. No respiratory distress. She has no wheezes.  Skin: R lateral calf with nodular infected insect bite with mild surrounding cellulitis,  - remaining skin is warm and dry. No rash noted. No erythema.  Psychiatric: She has a normal mood and affect. Her behavior is normal. Judgment and thought content normal.   Lab Results  Component Value Date   WBC 6.9 01/25/2012   HGB 12.4 01/25/2012   HCT 38.4 01/25/2012   PLT 353.0 01/25/2012   GLUCOSE 81 01/25/2012   CHOL 216* 01/25/2012   TRIG 91.0 01/25/2012   HDL 52.70 01/25/2012   LDLDIRECT 149.5 01/25/2012   LDLCALC 126* 09/22/2010   ALT 20 01/25/2012   AST 16 01/25/2012   NA 138 01/25/2012   K 4.4 01/25/2012   CL 106 01/25/2012   CREATININE 1.2 01/25/2012   BUN 11 01/25/2012   CO2 27 01/25/2012     TSH 1.04 01/25/2012      Assessment & Plan:   Infected insect bite with mild cellulitis kelfex Ibuprofen Elevation prn The patient is reassured that these symptoms do not appear to represent a serious or threatening condition.

## 2012-03-13 NOTE — Patient Instructions (Addendum)
It was good to see you today. Septra antibiotics 2x/day x 5 days - Your prescription(s) have been submitted to your pharmacy. Please take as directed and contact our office if you believe you are having problem(s) with the medication(s). Alternate between ibuprofen or aleve for aches, pain or fever symptoms as discussed Insect Bite Mosquitoes, flies, fleas, bedbugs, and many other insects can bite. Insect bites are different from insect stings. A sting is when venom is injected into the skin. Some insect bites can transmit infectious diseases. SYMPTOMS   Insect bites usually turn red, swell, and itch for 2 to 4 days. They often go away on their own. TREATMENT   Your caregiver may prescribe antibiotic medicines if a bacterial infection develops in the bite. HOME CARE INSTRUCTIONS  Do not scratch the bite area.   Keep the bite area clean and dry. Wash the bite area thoroughly with soap and water.   Put ice or cool compresses on the bite area.   Put ice in a plastic bag.   Place a towel between your skin and the bag.   Leave the ice on for 20 minutes, 4 times a day for the first 2 to 3 days, or as directed.   You may apply a baking soda paste, cortisone cream, or calamine lotion to the bite area as directed by your caregiver. This can help reduce itching and swelling.   Only take over-the-counter or prescription medicines as directed by your caregiver.   If you are given antibiotics, take them as directed. Finish them even if you start to feel better.  You may need a tetanus shot if:  You cannot remember when you had your last tetanus shot.   You have never had a tetanus shot.   The injury broke your skin.  If you get a tetanus shot, your arm may swell, get red, and feel warm to the touch. This is common and not a problem. If you need a tetanus shot and you choose not to have one, there is a rare chance of getting tetanus. Sickness from tetanus can be serious. SEEK IMMEDIATE MEDICAL  CARE IF:    You have increased pain, redness, or swelling in the bite area.   You see a red line on the skin coming from the bite.   You have a fever.   You have joint pain.   You have a headache or neck pain.   You have unusual weakness.   You have a rash.   You have chest pain or shortness of breath.   You have abdominal pain, nausea, or vomiting.   You feel unusually tired or sleepy.  MAKE SURE YOU:    Understand these instructions.   Will watch your condition.   Will get help right away if you are not doing well or get worse.  Document Released: 08/31/2004 Document Revised: 07/13/2011 Document Reviewed: 02/22/2011 [facility] Patient Information 2012 [place], [place].

## 2012-04-25 ENCOUNTER — Ambulatory Visit: Payer: 59 | Admitting: Internal Medicine

## 2012-05-03 ENCOUNTER — Ambulatory Visit: Payer: 59 | Admitting: Internal Medicine

## 2012-05-03 DIAGNOSIS — Z0289 Encounter for other administrative examinations: Secondary | ICD-10-CM

## 2012-08-20 ENCOUNTER — Emergency Department (HOSPITAL_COMMUNITY)
Admission: EM | Admit: 2012-08-20 | Discharge: 2012-08-21 | Disposition: A | Discharge status: Home / Self Care | Payer: 59 | Attending: Emergency Medicine | Admitting: Emergency Medicine

## 2012-08-20 ENCOUNTER — Encounter (HOSPITAL_COMMUNITY): Payer: Self-pay | Admitting: *Deleted

## 2012-08-20 DIAGNOSIS — R5383 Other fatigue: Secondary | ICD-10-CM | POA: Insufficient documentation

## 2012-08-20 DIAGNOSIS — R002 Palpitations: Secondary | ICD-10-CM | POA: Insufficient documentation

## 2012-08-20 DIAGNOSIS — Z862 Personal history of diseases of the blood and blood-forming organs and certain disorders involving the immune mechanism: Secondary | ICD-10-CM | POA: Insufficient documentation

## 2012-08-20 DIAGNOSIS — K219 Gastro-esophageal reflux disease without esophagitis: Secondary | ICD-10-CM | POA: Insufficient documentation

## 2012-08-20 DIAGNOSIS — Z8709 Personal history of other diseases of the respiratory system: Secondary | ICD-10-CM | POA: Insufficient documentation

## 2012-08-20 DIAGNOSIS — R0789 Other chest pain: Secondary | ICD-10-CM | POA: Insufficient documentation

## 2012-08-20 DIAGNOSIS — F329 Major depressive disorder, single episode, unspecified: Secondary | ICD-10-CM | POA: Insufficient documentation

## 2012-08-20 DIAGNOSIS — J45909 Unspecified asthma, uncomplicated: Secondary | ICD-10-CM | POA: Insufficient documentation

## 2012-08-20 DIAGNOSIS — K209 Esophagitis, unspecified without bleeding: Secondary | ICD-10-CM | POA: Insufficient documentation

## 2012-08-20 DIAGNOSIS — M7989 Other specified soft tissue disorders: Secondary | ICD-10-CM | POA: Insufficient documentation

## 2012-08-20 DIAGNOSIS — R0602 Shortness of breath: Secondary | ICD-10-CM | POA: Insufficient documentation

## 2012-08-20 DIAGNOSIS — R5381 Other malaise: Secondary | ICD-10-CM | POA: Insufficient documentation

## 2012-08-20 DIAGNOSIS — F3289 Other specified depressive episodes: Secondary | ICD-10-CM | POA: Insufficient documentation

## 2012-08-20 DIAGNOSIS — M549 Dorsalgia, unspecified: Secondary | ICD-10-CM | POA: Insufficient documentation

## 2012-08-20 DIAGNOSIS — Z8739 Personal history of other diseases of the musculoskeletal system and connective tissue: Secondary | ICD-10-CM | POA: Insufficient documentation

## 2012-08-20 DIAGNOSIS — Z8679 Personal history of other diseases of the circulatory system: Secondary | ICD-10-CM | POA: Insufficient documentation

## 2012-08-20 DIAGNOSIS — Z79899 Other long term (current) drug therapy: Secondary | ICD-10-CM | POA: Insufficient documentation

## 2012-08-20 DIAGNOSIS — R42 Dizziness and giddiness: Secondary | ICD-10-CM | POA: Insufficient documentation

## 2012-08-20 NOTE — ED Notes (Signed)
Pt reports x1 week of intermittent chest pain that the pt regarded as indigestion. Pt states tonight she began to experience dizziness/lightheadedness. Pt admits to hx of reflux and takes medication however does not feel like she is getting relief. Pt denies n/v or shortness of breath.

## 2012-08-21 ENCOUNTER — Emergency Department (HOSPITAL_COMMUNITY): Payer: 59

## 2012-08-21 LAB — COMPREHENSIVE METABOLIC PANEL
AST: 13 U/L (ref 0–37)
Albumin: 3.3 g/dL — ABNORMAL LOW (ref 3.5–5.2)
BUN: 11 mg/dL (ref 6–23)
Calcium: 9.3 mg/dL (ref 8.4–10.5)
Chloride: 100 mEq/L (ref 96–112)
Creatinine, Ser: 1.02 mg/dL (ref 0.50–1.10)
Total Protein: 7 g/dL (ref 6.0–8.3)

## 2012-08-21 LAB — D-DIMER, QUANTITATIVE: D-Dimer, Quant: 0.75 ug/mL-FEU — ABNORMAL HIGH (ref 0.00–0.48)

## 2012-08-21 LAB — CBC WITH DIFFERENTIAL/PLATELET
Basophils Absolute: 0 10*3/uL (ref 0.0–0.1)
Basophils Relative: 0 % (ref 0–1)
Eosinophils Absolute: 0.2 10*3/uL (ref 0.0–0.7)
Eosinophils Relative: 2 % (ref 0–5)
HCT: 37 % (ref 36.0–46.0)
MCH: 25.5 pg — ABNORMAL LOW (ref 26.0–34.0)
MCHC: 32.2 g/dL (ref 30.0–36.0)
Monocytes Absolute: 0.6 10*3/uL (ref 0.1–1.0)
Monocytes Relative: 8 % (ref 3–12)
Neutro Abs: 4.5 10*3/uL (ref 1.7–7.7)
RDW: 14.4 % (ref 11.5–15.5)

## 2012-08-21 LAB — TROPONIN I: Troponin I: <0.3 ng/mL (ref <0.30)

## 2012-08-21 MED ORDER — SUCRALFATE 1 GM/10ML PO SUSP: 1.0000 g | Freq: Three times a day (TID) | ORAL | Status: DC

## 2012-08-21 MED ORDER — SUCRALFATE 1 GM/10ML PO SUSP
1.0000 g | Freq: Three times a day (TID) | ORAL | Status: DC
  Administered 2012-08-21: 1 g via ORAL
  Filled 2012-08-21 (×6): qty 10

## 2012-08-21 MED ORDER — IOHEXOL 350 MG/ML SOLN
100.0000 mL | Freq: Once | INTRAVENOUS | Status: AC | PRN
  Administered 2012-08-21: 100 mL via INTRAVENOUS

## 2012-08-21 NOTE — ED Notes (Signed)
Patient transported to CT 

## 2012-08-21 NOTE — ED Provider Notes (Signed)
History     CSN: [id number]  Arrival date & time 08/20/12  2248   First MD Initiated Contact with Patient 08/21/12 0145      Chief Complaint  Patient presents with  . Chest Pain    (Consider location/radiation/quality/duration/timing/severity/associated sxs/prior treatment) HPI Comments: Pt states that for the last few weeks she has felt some chest pain and "burping". She says she has felt that she has had some shortness of breath, especially on exertion.    She feels like her chest and upper abdominal area are burning.  She states that she has experienced palpitations before, but that this has not been happening since these episodes of pain have been happening. She said she became worried tonight when she felt like she might pass out while having chest pain and shortness of breath.   The history is provided by the patient.    Past Medical History  Diagnosis Date  . ARTHRITIS   . MIGRAINE HEADACHE   . OBESITY, MORBID   . ALLERGIC RHINITIS   . ANEMIA-NOS   . ASTHMA   . GERD   . DEPRESSION     Past Surgical History  Procedure Date  . Cholecystectomy 2009  . Skin graft surgery (r) leg     childhood    Family History  Problem Relation Age of Onset  . Arthritis Mother   . Arthritis Father   . Alcohol abuse Other     Parents  . Arthritis Other     grandparents  . Diabetes Other     Parent & grandparent  . Hyperlipidemia Other     Parent, grandparent, other relative  . Hypertension Other     parent, grandparent, other relative  . Prostate cancer Other     grandfather    History  Substance Use Topics  . Smoking status: Never Smoker   . 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 Alcohol Use: Yes     Comment: Social    OB History    Grav Para Term Preterm Abortions TAB SAB Ect Mult Living                  Review of Systems  Constitutional: Positive for fatigue.  HENT:  Negative.   Eyes: Negative.   Respiratory: Positive for chest tightness and shortness of breath. Negative for cough, choking, wheezing and stridor.   Cardiovascular: Positive for chest pain, palpitations and leg swelling.       Pt states that her legs become swollen on an almost daily basis after working at her job where she sits all day.  She states that they are painful.  Gastrointestinal: Negative for nausea, vomiting, abdominal pain, constipation, blood in stool and abdominal distention.       Pt states that her last bowel movement seemed abnormal to her in that it was more infrequent than usual.  Genitourinary: Negative.   Musculoskeletal: Positive for back pain.       PT says chest pain radiates to her back  Skin: Negative.   Neurological: Positive for light-headedness. Negative for syncope and weakness.       PT says that she feels like she is "woozy" and may pass out  Psychiatric/Behavioral: Negative.   All other systems reviewed and are negative.    Allergies  Penicillins  Home Medications   Current Outpatient Rx  Name  Route  Sig  Dispense  Refill  .  ALBUTEROL SULFATE HFA 108 (90 BASE) MCG/ACT IN AERS   Inhalation   Inhale 2 puffs into the lungs every 4 (four) hours as needed for wheezing or shortness of breath.   1 Inhaler   1   . ALPRAZOLAM 0.5 MG PO TABS   Oral   Take 1 tablet (0.5 mg total) by mouth 3 (three) times daily as needed for sleep or anxiety.   30 tablet   1   . DEXLANSOPRAZOLE 60 MG PO CPDR   Oral   Take 1 capsule (60 mg total) by mouth daily. Take 1 by mouth daily   30 capsule   5   . FLUTICASONE PROPIONATE 50 MCG/ACT NA SUSP   Nasal   Place 2 sprays into the nose daily.           Marland Kitchen HYDROCHLOROTHIAZIDE 25 MG PO TABS   Oral   Take 0.5-1 tablets (12.5-25 mg total) by mouth daily.   30 tablet   1   . MECLIZINE HCL 25 MG PO TABS   Oral   Take 25 mg by mouth 3 (three) times daily as needed.         Marland Kitchen ALIGN 4 MG PO CAPS   Oral   Take  by mouth daily.          . SERTRALINE HCL 50 MG PO TABS   Oral   Take 1 tablet (50 mg total) by mouth daily.   30 tablet   6   . SUCRALFATE 1 GM/10ML PO SUSP   Oral   Take 10 mLs (1 g total) by mouth 4 (four) times daily -  with meals and at bedtime.   420 mL   0     BP 129/79  Pulse 69  Temp 97.9 F (36.6 C) (Oral)  Resp 23  SpO2 100%  Physical Exam  Nursing note and vitals reviewed. Constitutional: She is oriented to person, place, and time. She appears well-developed and well-nourished. No distress.  HENT:  Head: Normocephalic and atraumatic.  Eyes: Conjunctivae normal and EOM are normal. Pupils are equal, round, and reactive to light.  Neck: Normal range of motion. Neck supple.  Cardiovascular: Normal rate, regular rhythm and normal heart sounds.   Pulmonary/Chest: Effort normal and breath sounds normal. No stridor.  Abdominal: Soft. Bowel sounds are normal. There is tenderness.       Slight tenderness to palpation in upper left quadrant  Musculoskeletal: Normal range of motion. She exhibits edema and tenderness.       Lower leg swelling and pain bilaterally  Neurological: She is alert and oriented to person, place, and time. No cranial nerve deficit.  Skin: Skin is warm and dry. She is not diaphoretic.  Psychiatric: She has a normal mood and affect. Her behavior is normal.    ED Course  Procedures (including critical care time)  Labs Reviewed  CBC WITH DIFFERENTIAL - Abnormal; Notable for the following:    Hemoglobin 11.9 (*)     MCH 25.5 (*)     All other components within normal limits  COMPREHENSIVE METABOLIC PANEL - Abnormal; Notable for the following:    Albumin 3.3 (*)     Alkaline Phosphatase 131 (*)     Total Bilirubin 0.1 (*)     GFR calc non Af Amer 65 (*)     GFR calc Af Amer 75 (*)     All other components within normal limits  D-DIMER, QUANTITATIVE - Abnormal; Notable for the following:  D-Dimer, Quant 0.75 (*)     All other components  within normal limits  TROPONIN I   Ct Angio Chest Pe W/cm &/or Wo Cm  08/21/2012  *RADIOLOGY REPORT*  Clinical Data: Chest pain.  Dizziness.  Elevated D-dimer.  CT ANGIOGRAPHY CHEST  Technique:  Multidetector CT imaging of the chest using the standard protocol during bolus administration of intravenous contrast. Multiplanar reconstructed images including MIPs were obtained and reviewed to evaluate the vascular anatomy.  Contrast: OMNIPAQUE IOHEXOL 350 MG/ML SOLN  Comparison: None.  Findings: Technically adequate study with moderately good opacification of the central and segmental pulmonary arteries.  No focal filling defects.  No evidence of significant pulmonary embolus.  Diffuse enlargement of the thyroid gland suggesting goiter.  Mild dilatation of the esophagus with an air-fluid level distally. Changes could represent dysmotility or partial obstruction distally.  Normal heart size.  Normal caliber thoracic aorta.  No significant lymphadenopathy in the chest.  No pleural effusions. Visualized upper abdominal organs are grossly unremarkable.  No focal airspace consolidation in the lungs.  No pneumothorax. Airways appear patent.  Normal alignment of the thoracic vertebrae.  IMPRESSION: No evidence of significant pulmonary embolus.  Air-fluid level and a mildly distended esophagus may represent dysmotility.  Distal obstruction is not excluded.  Diffuse thyroid enlargement suggesting goiter.   Original Report Authenticated By: Burman Nieves, M.D.      1. Esophagitis       MDM  After review of the CTA of the lungs with no evidence of PE, the patient was [facility] home with carafate for GI symptoms and a referral to gastroenterologist.         Arman Filter, NP 08/21/12 0502

## 2012-08-23 NOTE — ED Provider Notes (Signed)
Medical screening examination/treatment/procedure(s) were performed by non-physician practitioner and as supervising physician I was immediately available for consultation/collaboration.  Gilda Crease, MD 08/23/12 1501

## 2012-09-17 ENCOUNTER — Ambulatory Visit (INDEPENDENT_AMBULATORY_CARE_PROVIDER_SITE_OTHER): Payer: 59 | Admitting: Internal Medicine

## 2012-09-17 ENCOUNTER — Encounter: Payer: Self-pay | Admitting: Internal Medicine

## 2012-09-17 VITALS — BP 128/90 | HR 96 | Temp 100.0°F

## 2012-09-17 DIAGNOSIS — K219 Gastro-esophageal reflux disease without esophagitis: Secondary | ICD-10-CM

## 2012-09-17 DIAGNOSIS — J45909 Unspecified asthma, uncomplicated: Secondary | ICD-10-CM

## 2012-09-17 DIAGNOSIS — J209 Acute bronchitis, unspecified: Secondary | ICD-10-CM

## 2012-09-17 MED ORDER — PROMETHAZINE-CODEINE 6.25-10 MG/5ML PO SYRP: 5.0000 mL | ORAL_SOLUTION | ORAL | Status: DC | PRN

## 2012-09-17 MED ORDER — AZITHROMYCIN 250 MG PO TABS: ORAL_TABLET | ORAL | Status: DC

## 2012-09-17 NOTE — Assessment & Plan Note (Signed)
Abnormal CT during ER visit 08/2012 - S/p GI eval 09/06/12> EGD scheduled for 2/14, but encouraged to reschedule if bronchitis symptoms unimproved in next 24-48h Interval hx and reports reviewed The current medical regimen is effective;  continue present plan and medications.

## 2012-09-17 NOTE — Progress Notes (Signed)
  Subjective:    HPI  complains of cough and cold symptoms  Onset 4 days ago, wax/wane symptoms, but worse at this time  Initially associated with sore throat, mild headache and low grade fever Now productive cough with mild-mod chest congestion, yellow sputum symptoms worse at night Min relief with OTC meds Precipitated by sick contacts  Past Medical History  Diagnosis Date  . ARTHRITIS   . MIGRAINE HEADACHE   . OBESITY, MORBID   . ALLERGIC RHINITIS   . ANEMIA-NOS   . ASTHMA   . GERD   . DEPRESSION     Review of Systems Constitutional: No unexpected weight change Pulmonary: No pleurisy or hemoptysis Cardiovascular: No chest pain or palpitations     Objective:   Physical Exam BP 128/90  Pulse 96  Temp(Src) 100 F (37.8 C) (Oral)  SpO2 99% GEN: mildly ill appearing and audible chest congestion HENT: NCAT, no sinus tenderness bilaterally, nares without discharge, oropharynx mild erythema, no exudate Eyes: Vision grossly intact, no conjunctivitis Lungs: scattered bilateral rhonchi, no wheeze, no increased work of breathing Cardiovascular: Regular rate and rhythm, no bilateral edema  Lab Results  Component Value Date   WBC 8.1 08/21/2012   HGB 11.9* 08/21/2012   HCT 37.0 08/21/2012   PLT 360 08/21/2012   GLUCOSE 89 08/21/2012   CHOL 216* 01/25/2012   TRIG 91.0 01/25/2012   HDL 52.70 01/25/2012   LDLDIRECT 149.5 01/25/2012   LDLCALC 126* 09/22/2010   ALT 12 08/21/2012   AST 13 08/21/2012   NA 135 08/21/2012   K 4.0 08/21/2012   CL 100 08/21/2012   CREATININE 1.02 08/21/2012   BUN 11 08/21/2012   CO2 25 08/21/2012   TSH 1.04 01/25/2012       Assessment & Plan:  Viral URI >progression to acute bronchitis Cough, postnasal drip related to above   Explained lack of efficacy for antibiotics in viral disease, but printed empiric antibiotics provided to fill if symptom duration greater than 7 days Prescription cough suppression - new prescriptions done Symptomatic care with  Tylenol or Advil, hydration and rest -  salt gargle advised as needed

## 2012-09-17 NOTE — Patient Instructions (Signed)
It was good to see you today. If you develop worsening symptoms or fever, we can reconsider antibiotics (Zpak printed prescription provided to fill IF unimproved by Sunday), but it does not appear necessary to use antibiotics at this time. prescription cough syrup - this prescription has been submitted to your pharmacy. Please take as directed and contact our office if you believe you are having problem(s) with the medication(s). Alternate between ibuprofen and tylenol for aches, pain and fever symptoms as discussed Hydrate, rest and call if worse or unimproved Acute Bronchitis You have acute bronchitis. This means you have a chest cold. The airways in your lungs are red and sore (inflamed). Acute means it is sudden onset.  CAUSES Bronchitis is most often caused by the same virus that causes a cold. SYMPTOMS   Body aches.  Chest congestion.  Chills.  Cough.  Fever.  Shortness of breath.  Sore throat. TREATMENT  Acute bronchitis is usually treated with rest, fluids, and medicines for relief of fever or cough. Most symptoms should go away after a few days or a week. Increased fluids may help thin your secretions and will prevent dehydration. Your caregiver may give you an inhaler to improve your symptoms. The inhaler reduces shortness of breath and helps control cough. You can take over-the-counter pain relievers or cough medicine to decrease coughing, pain, or fever. A cool-air vaporizer may help thin bronchial secretions and make it easier to clear your chest. Antibiotics are usually not needed but can be prescribed if you smoke, are seriously ill, have chronic lung problems, are elderly, or you are at higher risk for developing complications.Allergies and asthma can make bronchitis worse. Repeated episodes of bronchitis may cause longstanding lung problems. Avoid smoking and secondhand smoke.Exposure to cigarette smoke or irritating chemicals will make bronchitis worse. If you are a  cigarette smoker, consider using nicotine gum or skin patches to help control withdrawal symptoms. Quitting smoking will help your lungs heal faster. Recovery from bronchitis is often slow, but you should start feeling better after 2 to 3 days. Cough from bronchitis frequently lasts for 3 to 4 weeks. To prevent another bout of acute bronchitis:  Quit smoking.  Wash your hands frequently to get rid of viruses or use a hand sanitizer.  Avoid other people with cold or virus symptoms.  Try not to touch your hands to your mouth, nose, or eyes. SEEK IMMEDIATE MEDICAL CARE IF:  You develop increased fever, chills, or chest pain.  You have severe shortness of breath or bloody sputum.  You develop dehydration, fainting, repeated vomiting, or a severe headache.  You have no improvement after 1 week of treatment or you get worse. MAKE SURE YOU:   Understand these instructions.  Will watch your condition.  Will get help right away if you are not doing well or get worse. Document Released: 08/31/2004 Document Revised: 10/16/2011 Document Reviewed: 11/16/2010 [facility] Patient Information 2013 [place], [place].

## 2012-09-17 NOTE — Assessment & Plan Note (Signed)
No acute flare despite acute infection Hold pred taper at this time and continue prn albuterol

## 2012-09-20 ENCOUNTER — Other Ambulatory Visit: Payer: Self-pay | Admitting: Internal Medicine

## 2012-11-05 ENCOUNTER — Other Ambulatory Visit: Payer: Self-pay | Admitting: Gastroenterology

## 2012-11-25 ENCOUNTER — Ambulatory Visit (HOSPITAL_COMMUNITY): Admission: RE | Admit: 2012-11-25 | Payer: 59 | Source: Ambulatory Visit | Admitting: Gastroenterology

## 2012-11-25 ENCOUNTER — Encounter (HOSPITAL_COMMUNITY): Admission: RE | Payer: Self-pay | Source: Ambulatory Visit

## 2012-11-25 SURGERY — EGD (ESOPHAGOGASTRODUODENOSCOPY)
Anesthesia: Moderate Sedation

## 2012-11-27 ENCOUNTER — Ambulatory Visit (INDEPENDENT_AMBULATORY_CARE_PROVIDER_SITE_OTHER): Payer: 59 | Admitting: Internal Medicine

## 2012-11-27 ENCOUNTER — Encounter: Payer: Self-pay | Admitting: Internal Medicine

## 2012-11-27 ENCOUNTER — Other Ambulatory Visit: Payer: Self-pay | Admitting: Gastroenterology

## 2012-11-27 VITALS — BP 120/84 | HR 66 | Temp 98.1°F | Wt 284.8 lb

## 2012-11-27 DIAGNOSIS — F411 Generalized anxiety disorder: Secondary | ICD-10-CM

## 2012-11-27 DIAGNOSIS — K317 Polyp of stomach and duodenum: Secondary | ICD-10-CM

## 2012-11-27 DIAGNOSIS — D131 Benign neoplasm of stomach: Secondary | ICD-10-CM

## 2012-11-27 DIAGNOSIS — K219 Gastro-esophageal reflux disease without esophagitis: Secondary | ICD-10-CM

## 2012-11-27 MED ORDER — ALPRAZOLAM 0.5 MG PO TABS: 0.5000 mg | ORAL_TABLET | Freq: Three times a day (TID) | ORAL | Status: DC | PRN

## 2012-11-27 NOTE — Progress Notes (Signed)
  Subjective:    Patient ID: Alicia Chen, female    DOB: March 10, 1966, 47 y.o.   MRN: [id number]  HPI  Here for concern about gastric polyps on EGD done 3/28 with Dr. Loreta Ave Scheduled for removal at hospital 48h ago, but cancelled due to lack of appropraite anesthesia anxious and nervous about same  Past Medical History  Diagnosis Date  . ARTHRITIS   . MIGRAINE HEADACHE   . OBESITY, MORBID   . ALLERGIC RHINITIS   . ANEMIA-NOS   . ASTHMA   . GERD   . DEPRESSION    Review of Systems  Constitutional: Negative for fever and fatigue.  Gastrointestinal: Negative for nausea, vomiting and abdominal pain.  Skin: Negative for rash.  Psychiatric/Behavioral: Positive for sleep disturbance. The patient is nervous/anxious.        Objective:   Physical Exam  BP 120/84  Pulse 66  Temp(Src) 98.1 F (36.7 C) (Oral)  Wt 284 lb 12.8 oz (129.184 kg)  BMI 50.46 kg/m2  SpO2 98% Wt Readings from Last 3 Encounters:  11/27/12 284 lb 12.8 oz (129.184 kg)  01/25/12 284 lb (128.822 kg)  05/10/11 280 lb (127.007 kg)   Constitutional: She is overweight, but appears well-developed and well-nourished. No distress. nontoxic Neck: Normal range of motion. Neck supple. No JVD present. No thyromegaly present.  Cardiovascular: Normal rate, regular rhythm and normal heart sounds.  No murmur heard. No BLE edema. Pulmonary/Chest: Effort normal and breath sounds normal. No respiratory distress. She has no wheezes.  Psychiatric: She has a mildly anxious mood and affect. Her behavior is normal. Judgment and thought content normal.   Lab Results  Component Value Date   WBC 8.1 08/21/2012   HGB 11.9* 08/21/2012   HCT 37.0 08/21/2012   PLT 360 08/21/2012   GLUCOSE 89 08/21/2012   CHOL 216* 01/25/2012   TRIG 91.0 01/25/2012   HDL 52.70 01/25/2012   LDLDIRECT 149.5 01/25/2012   LDLCALC 126* 09/22/2010   ALT 12 08/21/2012   AST 13 08/21/2012   NA 135 08/21/2012   K 4.0 08/21/2012   CL 100 08/21/2012   CREATININE 1.02  08/21/2012   BUN 11 08/21/2012   CO2 25 08/21/2012   TSH 1.04 01/25/2012      Assessment & Plan:   Gastric polyps on EGD March 28 with Dr Loreta Ave - planning for removal under general anesthesia at hospital, procedure delayed at this time Morbid obesity with high risk for apnea, but no known dx same GERD anxiety  Refer for second opinion from gastroenterology as requested Continue work on weight reduction as ongoing Denies symptoms of daytime sleepiness or snoring, but consider evaluation for sleep apnea if persisting obesity Refill Xanax for situational anxiety  Time spent with pt today 25 minutes, greater than 50% time spent counseling patient on gastric polyps, plans for endo removal, anxiety and medication review. Also review of prior records

## 2012-11-27 NOTE — Patient Instructions (Signed)
It was good to see you today. We have reviewed your prior records including labs and tests today we'll make referral to [facility] for 2nd opinion about your gastric polyps and management/removal of same. Our office will contact you regarding appointment(s) once made. Medications reviewed and updated, continue as currently prescribed. Refill of Xanax as requested Continue to work on weight loss efforts as reviewed, will followup on this as discussed

## 2012-12-02 ENCOUNTER — Encounter (HOSPITAL_COMMUNITY): Payer: Self-pay | Admitting: *Deleted

## 2012-12-10 ENCOUNTER — Encounter (HOSPITAL_COMMUNITY): Payer: Self-pay | Admitting: Pharmacy Technician

## 2012-12-11 ENCOUNTER — Encounter: Payer: Self-pay | Admitting: Internal Medicine

## 2012-12-23 ENCOUNTER — Ambulatory Visit (HOSPITAL_COMMUNITY): Payer: 59 | Admitting: Anesthesiology

## 2012-12-23 ENCOUNTER — Encounter (HOSPITAL_COMMUNITY): Payer: Self-pay | Admitting: Anesthesiology

## 2012-12-23 ENCOUNTER — Encounter (HOSPITAL_COMMUNITY): Payer: Self-pay

## 2012-12-23 ENCOUNTER — Ambulatory Visit (HOSPITAL_COMMUNITY)
Admission: RE | Admit: 2012-12-23 | Discharge: 2012-12-23 | Disposition: A | Discharge status: Home / Self Care | Payer: 59 | Source: Ambulatory Visit | Attending: Gastroenterology | Admitting: Gastroenterology

## 2012-12-23 ENCOUNTER — Encounter (HOSPITAL_COMMUNITY)
Admission: RE | Disposition: A | Discharge status: Home / Self Care | Payer: Self-pay | Source: Ambulatory Visit | Attending: Gastroenterology

## 2012-12-23 DIAGNOSIS — Z9089 Acquired absence of other organs: Secondary | ICD-10-CM | POA: Insufficient documentation

## 2012-12-23 DIAGNOSIS — R079 Chest pain, unspecified: Secondary | ICD-10-CM | POA: Insufficient documentation

## 2012-12-23 DIAGNOSIS — J45909 Unspecified asthma, uncomplicated: Secondary | ICD-10-CM | POA: Insufficient documentation

## 2012-12-23 DIAGNOSIS — Z79899 Other long term (current) drug therapy: Secondary | ICD-10-CM | POA: Insufficient documentation

## 2012-12-23 DIAGNOSIS — R1013 Epigastric pain: Secondary | ICD-10-CM | POA: Insufficient documentation

## 2012-12-23 DIAGNOSIS — D131 Benign neoplasm of stomach: Secondary | ICD-10-CM | POA: Insufficient documentation

## 2012-12-23 DIAGNOSIS — K219 Gastro-esophageal reflux disease without esophagitis: Secondary | ICD-10-CM | POA: Insufficient documentation

## 2012-12-23 HISTORY — PX: ESOPHAGOGASTRODUODENOSCOPY: SHX5428

## 2012-12-23 SURGERY — EGD (ESOPHAGOGASTRODUODENOSCOPY)
Anesthesia: Monitor Anesthesia Care

## 2012-12-23 MED ORDER — LIDOCAINE VISCOUS 2 % MT SOLN
OROMUCOSAL | Status: DC | PRN
  Administered 2012-12-23: 5 mL via OROMUCOSAL

## 2012-12-23 MED ORDER — MIDAZOLAM HCL 5 MG/5ML IJ SOLN
INTRAMUSCULAR | Status: DC | PRN
  Administered 2012-12-23: 2 mg via INTRAVENOUS

## 2012-12-23 MED ORDER — LIDOCAINE VISCOUS 2 % MT SOLN
OROMUCOSAL | Status: AC
  Filled 2012-12-23: qty 15

## 2012-12-23 MED ORDER — LACTATED RINGERS IV SOLN
INTRAVENOUS | Status: DC | PRN
  Administered 2012-12-23: 14:00:00 via INTRAVENOUS

## 2012-12-23 MED ORDER — SODIUM CHLORIDE 0.9 % IV SOLN: INTRAVENOUS | Status: DC

## 2012-12-23 MED ORDER — PROPOFOL INFUSION 10 MG/ML OPTIME
INTRAVENOUS | Status: DC | PRN
  Administered 2012-12-23: 160 ug/kg/min via INTRAVENOUS

## 2012-12-23 MED ORDER — KETAMINE HCL 10 MG/ML IJ SOLN
INTRAMUSCULAR | Status: DC | PRN
  Administered 2012-12-23: 20 mg via INTRAVENOUS
  Administered 2012-12-23: 10 mg via INTRAVENOUS

## 2012-12-23 NOTE — H&P (Signed)
Alicia Chen is an 47 y.o. female.   Chief Complaint: Severe reflux. HPI: Patient is here for an EGD to evaluate severe reflux.   Past Medical History  Diagnosis Date  . ARTHRITIS   . MIGRAINE HEADACHE   . OBESITY, MORBID   . ALLERGIC RHINITIS   . ASTHMA   . GERD   . DEPRESSION   . ANEMIA-NOS   . Swelling of left lower extremity   . Swelling of right lower extremity   . Complication of anesthesia 10/2012    headache   Past Surgical History  Procedure Laterality Date  . Cholecystectomy  2009  . Skin graft surgery (r) leg      childhood   Family History  Problem Relation Age of Onset  . Arthritis Mother   . Arthritis Father   . Alcohol abuse Other     Parents  . Arthritis Other     grandparents  . Diabetes Other     Parent & grandparent  . Hyperlipidemia Other     Parent, grandparent, other relative  . Hypertension Other     parent, grandparent, other relative  . Prostate cancer Other     grandfather   Social History:  reports that she has never smoked. She has never used smokeless tobacco. She reports that  drinks alcohol. She reports that she does not use illicit drugs.  Allergies:  Allergies  Allergen Reactions  . Penicillins Nausea And Vomiting    hives    Medications Prior to Admission  Medication Sig Dispense Refill  . ALPRAZolam (XANAX) 0.5 MG tablet Take 1 tablet (0.5 mg total) by mouth 3 (three) times daily as needed for sleep or anxiety.  30 tablet  1  . cetirizine (ZYRTEC) 10 MG tablet Take 10 mg by mouth every evening.      Marland Kitchen DEXILANT 60 MG capsule take capsule by mouth once daily  30 capsule  5  . hydrochlorothiazide (HYDRODIURIL) 25 MG tablet Take 12.5-25 mg by mouth daily as needed (for swelling).       . meclizine (ANTIVERT) 25 MG tablet Take 25 mg by mouth 3 (three) times daily as needed for dizziness.       . Multiple Vitamin (MULTIVITAMIN WITH MINERALS) TABS Take 1 tablet by mouth daily.      . Multiple Vitamin (MULTIVITAMIN) tablet Take 1  tablet by mouth daily.      . NON FORMULARY Ultra flora IB take 1 tablet every day      . albuterol (VENTOLIN HFA) 108 (90 BASE) MCG/ACT inhaler Inhale 2 puffs into the lungs every 4 (four) hours as needed for wheezing or shortness of breath.  1 Inhaler  1  . fluticasone (FLONASE) 50 MCG/ACT nasal spray Place 2 sprays into the nose as needed for allergies.       . pseudoephedrine-acetaminophen (TYLENOL SINUS) 30-500 MG TABS Take 1 tablet by mouth every 4 (four) hours as needed (for sinus).        No results found for this or any previous visit (from the past 48 hour(s)). No results found.  Review of Systems  Constitutional: Negative.   Respiratory: Negative.   Cardiovascular: Negative.   Gastrointestinal: Positive for heartburn and abdominal pain. Negative for nausea, vomiting, constipation and blood in stool.  Genitourinary: Negative.   Skin: Negative.   Neurological: Positive for headaches.  Psychiatric/Behavioral: Negative.     Blood pressure 153/102, temperature 97.6 F (36.4 C), temperature source Oral, resp. rate 19, height 5\' 3"  (  1.6 m), weight 126.554 kg (279 lb), last menstrual period 10/23/2012, SpO2 100.00%. Physical Exam  Constitutional: She is oriented to person, place, and time. She appears well-developed and well-nourished.  HENT:  Head: Normocephalic and atraumatic.  Eyes: Conjunctivae and EOM are normal. Pupils are equal, round, and reactive to light.  Neck: Normal range of motion. Neck supple.  Cardiovascular: Normal rate, regular rhythm and normal heart sounds.   Respiratory: Effort normal and breath sounds normal.  GI: Soft. Bowel sounds are normal. She exhibits no distension and no mass. There is tenderness. There is no rebound and no guarding.  Musculoskeletal: Normal range of motion.  Neurological: She is alert and oriented to person, place, and time.  Skin: Skin is warm and dry.  Psychiatric: She has a normal mood and affect. Her behavior is normal. Thought  content normal.     Assessment/Plan Severe reflux/epigastric pain: proceed with an EGD at this time. [name] 12/23/2012, 1:48 PM

## 2012-12-23 NOTE — Addendum Note (Signed)
Addendum created 12/23/12 1511 by Azell Der, MD   Modules edited: Anesthesia Responsible Staff

## 2012-12-23 NOTE — Anesthesia Postprocedure Evaluation (Signed)
  Anesthesia Post-op Note  Patient: Alicia Chen  Procedure(s) Performed: Procedure(s) (LRB): ESOPHAGOGASTRODUODENOSCOPY (EGD) (N/A)  Patient Location: PACU  Anesthesia Type: MAC  Level of Consciousness: awake and alert   Airway and Oxygen Therapy: Patient Spontanous Breathing  Post-op Pain: mild  Post-op Assessment: Post-op Vital signs reviewed, Patient's Cardiovascular Status Stable, Respiratory Function Stable, Patent Airway and No signs of Nausea or vomiting  Last Vitals:  Filed Vitals:   12/23/12 1420  BP: 145/88  Temp:   Resp: 29    Post-op Vital Signs: stable   Complications: No apparent anesthesia complications

## 2012-12-23 NOTE — Op Note (Signed)
[facility] [address] [place], [address]   OPERATIVE PROCEDURE REPORT  PATIENT :Alicia Chen, Alicia Chen  MR#: [id number] BIRTHDATE :April 03, 1966 GENDER: Female ENDOSCOPIST: Dr.  Lorenza Burton, MD ASSISTANT:   Dwain Sarna, RN [facility] Windell Hummingbird, technician PROCEDURE DATE: 2013-01-20 PRE-PROCEDURE PREPERATION: Patient fasted for 8 hours prior to the procedure. PRE-PROCEDURE PHYSICAL: Patient has stable vital signs.  Neck is supple.  There is no JVD, thyromegaly or LAD.  Chest clear to auscultation.  S1 and S2 regular.  Abdomen soft, morbidly obese, non-distended, non-tender with NABS. PROCEDURE:     EGD w/ snare technique ASA CLASS:     Class III INDICATIONS:     1. Epigastric pain 2. GERD 3. Chest pain.Marland Kitchen MEDICATIONS:     Versed 2 mg IV and Diprivan 250mg  IV TOPICAL ANESTHETIC:   Viscous xylocaine-15 cc PO.  DESCRIPTION OF PROCEDURE:   After the risks benefits and alternatives of the procedure were thoroughly explained, informed consent was obtained.  The Pentax Gastroscope [id number]  was introduced through the mouth and advanced to the second portion of the duodenum , without limitations. The instrument was slowly withdrawn as the mucosa was fully examined.   The esophagus, GEJ and the proximal small bowel appeared normal. There were no ulcers, erosions or masses noted. Retroflexed views revealed no abnormalities. Multiple large sessile polyps were noted in the body of the stomach and 10 of these were removed by hot snare x 10-200/20-only 3 of these could be retrieved as the patient was very combative. The scope was then withdrawn from the patient and the procedure terminated. The patient tolerated the procedure without immediate complications.  IMPRESSION:  Multiple large sessile gastric polyps-10 removed by hot snare and 3 recovered; otherwise normal EGD.  RECOMMENDATIONS:     1.  Await pathology results. 2.  Anti-reflux regimen to be followed. 3.  Avoid  NSAIDS for now. 4.  Continue PPI's. 5.  OP follow-up in 2 weeks. 6. Evaluate for sleep apnea ASAP.   REPEAT EXAM:  No recall planned for now.  DISCHARGE INSTRUCTIONS: Standard discharge instructions given. _______________________________ eSigned:  Dr. Lorenza Burton, MD 20-Jan-2013 7:38 PM   CPT CODES:     [phone], EGD with Removal Polyps (snare)  DIAGNOSIS CODES:     530.81 GERD 789.06, Epigastric pain 5786.50 Chest pain   CC: Antionette Char, M.D.  Rene Paci, M.D.

## 2012-12-23 NOTE — Anesthesia Preprocedure Evaluation (Signed)
Anesthesia Evaluation  Patient identified by MRN, date of birth, ID band Patient awake    Reviewed: Allergy & Precautions, H&P , NPO status , Patient's Chart, lab work & pertinent test results  Airway Mallampati: II TM Distance: >3 FB Neck ROM: Full    Dental no notable dental hx.    Pulmonary asthma ,  breath sounds clear to auscultation  Pulmonary exam normal       Cardiovascular negative cardio ROS  Rhythm:Regular Rate:Normal     Neuro/Psych  Headaches, PSYCHIATRIC DISORDERS Depression    GI/Hepatic Neg liver ROS, GERD-  Medicated,  Endo/Other  Morbid obesity  Renal/GU negative Renal ROS  negative genitourinary   Musculoskeletal negative musculoskeletal ROS (+)   Abdominal (+) + obese,   Peds negative pediatric ROS (+)  Hematology negative hematology ROS (+)   Anesthesia Other Findings   Reproductive/Obstetrics negative OB ROS                           Anesthesia Physical Anesthesia Plan  ASA: III  Anesthesia Plan: MAC   Post-op Pain Management:    Induction: Intravenous  Airway Management Planned:   Additional Equipment:   Intra-op Plan:   Post-operative Plan:   Informed Consent: I have reviewed the patients History and Physical, chart, labs and discussed the procedure including the risks, benefits and alternatives for the proposed anesthesia with the patient or authorized representative who has indicated his/her understanding and acceptance.   Dental advisory given  Plan Discussed with: CRNA  Anesthesia Plan Comments:         Anesthesia Quick Evaluation

## 2012-12-23 NOTE — Transfer of Care (Signed)
Immediate Anesthesia Transfer of Care Note  Patient: Alicia Chen  Procedure(s) Performed: Procedure(s): ESOPHAGOGASTRODUODENOSCOPY (EGD) (N/A)  Patient Location: PACU and Endoscopy Unit  Anesthesia Type:MAC  Level of Consciousness: awake and alert   Airway & Oxygen Therapy: Patient Spontanous Breathing and Patient connected to nasal cannula oxygen  Post-op Assessment: Report given to PACU RN and Post -op Vital signs reviewed and stable  Post vital signs: Reviewed and stable  Complications: No apparent anesthesia complications

## 2012-12-25 ENCOUNTER — Encounter (HOSPITAL_COMMUNITY): Payer: Self-pay | Admitting: Gastroenterology

## 2013-01-06 ENCOUNTER — Encounter: Payer: Self-pay | Admitting: Neurology

## 2013-01-06 ENCOUNTER — Ambulatory Visit (INDEPENDENT_AMBULATORY_CARE_PROVIDER_SITE_OTHER): Payer: 59 | Admitting: Neurology

## 2013-01-06 VITALS — BP 129/78 | HR 71 | Temp 98.4°F | Ht 63.0 in | Wt 285.0 lb

## 2013-01-06 DIAGNOSIS — G4733 Obstructive sleep apnea (adult) (pediatric): Secondary | ICD-10-CM

## 2013-01-06 NOTE — Patient Instructions (Addendum)

## 2013-01-06 NOTE — Progress Notes (Signed)
Subjective:    Patient ID: Alicia Chen is a 47 y.o. female.  HPI  [name], MD, PhD [facility] [address] [address] [place], [place] [address]  Dear Dr. Loreta Ave,   I saw your patient, Alicia Chen, upon your kind request in my neurologic clinic today for initial consultation of her sleep disorder, in particular concern for obstructive sleep apnea. The patient is unaccompanied today. As you know, Alicia Chen is a very pleasant 47 year old right-handed woman with an underlying medical history of reflux disease, depression, asthma, migraine headaches, obesity and seasonal allergies underwent a upper GI endoscopic study and her laryngeal mask anesthesia was noted to have severe apneic events.  Her typical bedtime is reported to be around 10:30-11:30 PM and usual wake time is around 6 to 6:30 AM. Sleep onset typically occurs within 15-20 minutes. She has the TV on at night, but turns off after 2 hours. She reports feeling fairly rested upon awakening. She wakes up on an average 3 in the middle of the night and has to go to the bathroom 3 times on a typical night. She denies morning headaches.  She reports some daytime tiredness and Her Epworth Sleepiness Score (ESS) is 6/24 today. She has not fallen asleep while driving.   She works for the [employer] of [employer].  She is working on wt loss.  She has been known to snore for the past many years. Snoring is reportedly moderate, and associated with choking sounds and witnessed apneas. The patient reports a sense of choking or strangling feeling. There is report of nighttime reflux, with rare nighttime cough experienced. The patient has not noted any RLS symptoms and is not known to kick while asleep or before falling asleep. She is a restless sleeper and in the morning, the bed is quite disheveled.   She denies cataplexy, sleep paralysis, hypnagogic or hypnopompic hallucinations, or sleep attacks. She  does not report any vivid dreams, nightmares, dream enactments, or parasomnias, such as sleep talking or sleep walking. The patient has not had a sleep study or a home sleep test.  She consumes 2 to 3 caffeinated beverages per day, usually in the form of coffee in the morning.  Her mother had a CPAP machine, but she was diagnosed with a goiter and after thyroidectomy she did not need her CPAP machine.   Her Past Medical History Is Significant For: Past Medical History  Diagnosis Date  . ARTHRITIS   . MIGRAINE HEADACHE   . OBESITY, MORBID   . ALLERGIC RHINITIS   . ASTHMA   . GERD   . DEPRESSION   . ANEMIA-NOS   . Swelling of left lower extremity   . Swelling of right lower extremity   . Complication of anesthesia 10/2012    headache    Her Past Surgical History Is Significant For: Past Surgical History  Procedure Laterality Date  . Cholecystectomy  2009  . Skin graft surgery (r) leg      childhood  . Esophagogastroduodenoscopy N/A 12/23/2012    Procedure: ESOPHAGOGASTRODUODENOSCOPY (EGD);  Surgeon: Charna Elizabeth, MD;  Location: [facility] ENDOSCOPY;  Service: Endoscopy;  Laterality: N/A;    Her Family History Is Significant For: Family History  Problem Relation Age of Onset  . Arthritis Mother   . Arthritis Father   . Alcohol abuse Other     Parents  . Arthritis Other     grandparents  . Diabetes Other  Parent & grandparent  . Hyperlipidemia Other     Parent, grandparent, other relative  . Hypertension Other     parent, grandparent, other relative  . Prostate cancer Other     grandfather    Her Social History Is Significant For: History   Social History  . Marital Status: Divorced    Spouse Name: N/A    Number of Children: N/A  . Years of Education: N/A   Social History Main Topics  . Smoking status: Never Smoker   . 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 Alcohol Use: Yes      Comment: Social  . Drug Use: No  . Sexually Active: None   Other Topics Concern  . None   Social History Narrative  . None    Her Allergies Are:  Allergies  Allergen Reactions  . Penicillins Nausea And Vomiting    hives  :   Her Current Medications Are:  Outpatient Encounter Prescriptions as of 01/06/2013  Medication Sig Dispense Refill  . albuterol (VENTOLIN HFA) 108 (90 BASE) MCG/ACT inhaler Inhale 2 puffs into the lungs every 4 (four) hours as needed for wheezing or shortness of breath.  1 Inhaler  1  . ALPRAZolam (XANAX) 0.5 MG tablet Take 1 tablet (0.5 mg total) by mouth 3 (three) times daily as needed for sleep or anxiety.  30 tablet  1  . cetirizine (ZYRTEC) 10 MG tablet Take 10 mg by mouth every evening.      Marland Kitchen DEXILANT 60 MG capsule take capsule by mouth once daily  30 capsule  5  . fluticasone (FLONASE) 50 MCG/ACT nasal spray Place 2 sprays into the nose as needed for allergies.       . hydrochlorothiazide (HYDRODIURIL) 25 MG tablet Take 12.5-25 mg by mouth daily as needed (for swelling).       . Linaclotide (LINZESS) 145 MCG CAPS Take 145 mcg by mouth daily.      . meclizine (ANTIVERT) 25 MG tablet Take 25 mg by mouth 3 (three) times daily as needed for dizziness.       . Multiple Vitamin (MULTIVITAMIN) tablet Take 1 tablet by mouth daily.      . NON FORMULARY Ultra flora IB take 1 tablet every day      . pseudoephedrine-acetaminophen (TYLENOL SINUS) 30-500 MG TABS Take 1 tablet by mouth every 4 (four) hours as needed (for sinus).      . [DISCONTINUED] Multiple Vitamin (MULTIVITAMIN WITH MINERALS) TABS Take 1 tablet by mouth daily.       No facility-administered encounter medications on file as of 01/06/2013.   Review of Systems  Respiratory:       Snoring  Cardiovascular: Positive for leg swelling.  Gastrointestinal: Positive for constipation.  Allergic/Immunologic: Positive for environmental allergies.  Neurological: Positive for numbness.  Psychiatric/Behavioral:  Positive for dysphoric mood. The patient is nervous/anxious.     Objective:  Neurologic Exam  Physical Exam Physical Examination:   Filed Vitals:   01/06/13 0906  BP: 129/78  Pulse: 71  Temp: 98.4 F (36.9 C)    General Examination: The patient is a very pleasant 47 y.o. female in no acute distress. She appears well-developed and well-nourished and very well groomed. She is morbidly obese.  HEENT: Normocephalic, atraumatic, pupils are equal, round and reactive to light and accommodation. Funduscopic exam is normal with sharp disc margins noted. Extraocular tracking is good without limitation to  gaze excursion or nystagmus noted. Normal smooth pursuit is noted. Hearing is grossly intact. Tympanic membranes are clear bilaterally. Face is symmetric with normal facial animation and normal facial sensation. Speech is clear with no dysarthria noted. There is no hypophonia. There is no lip, neck/head, jaw or voice tremor. Neck is supple with full range of passive and active motion. There are no carotid bruits on auscultation. Oropharynx exam reveals: mild mouth dryness, good dental hygiene and moderate airway crowding, due to a narrow airway entry, larger tongue and tonsillar size of 1+. Mallampati is class III. Tongue protrudes centrally and palate elevates symmetrically. Neck size is 14.25 inches.   Chest: Clear to auscultation without wheezing, rhonchi or crackles noted.  Heart: S1+S2+0, regular and normal without murmurs, rubs or gallops noted.   Abdomen: Soft, non-tender and non-distended with normal bowel sounds appreciated on auscultation.  Extremities: There is 1+ pitting edema in the distal lower extremities bilaterally.   Skin: Warm and dry without trophic changes noted. There are no varicose veins.  Musculoskeletal: exam reveals no obvious joint deformities, tenderness or joint swelling or erythema.   Neurologically:  Mental status: The patient is awake, alert and oriented in  all 4 spheres. Her memory, attention, language and knowledge are appropriate. There is no aphasia, agnosia, apraxia or anomia. Speech is clear with normal prosody and enunciation. Thought process is linear. Mood is congruent and affect is normal.  Cranial nerves are as described above under HEENT exam. In addition, shoulder shrug is normal with equal shoulder height noted. Motor exam: Normal bulk, strength and tone is noted. There is no drift, tremor or rebound. Romberg is negative. Reflexes are 2+ throughout. Fine motor skills are intact with normal finger taps, normal hand movements, normal rapid alternating patting, normal foot taps and normal foot agility.  Cerebellar testing shows no dysmetria or intention tremor on finger to nose testing. There is no truncal or gait ataxia.  Sensory exam is intact to light touch, pinprick, vibration, temperature sense and proprioception in the upper and lower extremities.  Gait, station and balance are unremarkable. No veering to one side is noted. No leaning to one side is noted. Posture is age-appropriate and stance is narrow based. No problems turning are noted. She turns en bloc. Tandem walk is unremarkable. Intact toe and heel stance is noted.               Assessment and Plan:   In summary, LILO WALLINGTON is a very pleasant 47 y.o.-year old female with a history and physical exam concerning for obstructive sleep apnea (OSA). I had a long chat with the patient about my findings and the diagnosis, its prognosis and treatment options. We talked about medical treatments and non-pharmacological approaches. I explained in particular the risks and ramifications of untreated moderate to severe OSA, especially with respect to developing cardiovascular disease down the Road, including congestive heart failure, difficult to treat hypertension, cardiac arrhythmias, or stroke. Even type 2 diabetes has in part been linked to untreated OSA. We talked about trying to maintain a  healthy lifestyle in general, as well as the importance of weight control. I encouraged the patient to eat healthy, exercise daily and keep well hydrated, to keep a scheduled bedtime and wake time routine, to not skip any meals and eat healthy snacks in between meals.  I recommended the following at this time: sleep study.  I explained the sleep test procedure to the patient and also outlined surgical and non-surgical treatment  options of OSA including the use of a dental custom-made appliance, upper airway surgery such as pillar implants, radiofrequency surgery, tongue base surgery, and UPPP. I also explained the CPAP treatment option to the patient, who indicated that she would be willing to try CPAP if the need arises. I explained the importance of being compliant with PAP treatment, not only for insurance purposes but primarily for the patient's long term health benefit. I answered all her questions today and the patient was in agreement. I would like to see her back after the sleep test.   Thank you very much for allowing me to participate in the care of this nice patient. If I can be of any further assistance to you please do not hesitate to call me at [phone].  Sincerely,   [name], MD, PhD

## 2013-01-22 ENCOUNTER — Ambulatory Visit (INDEPENDENT_AMBULATORY_CARE_PROVIDER_SITE_OTHER): Payer: 59 | Admitting: Neurology

## 2013-01-22 VITALS — BP 124/78 | HR 70 | Ht 63.0 in | Wt 285.0 lb

## 2013-01-22 DIAGNOSIS — G4733 Obstructive sleep apnea (adult) (pediatric): Secondary | ICD-10-CM

## 2013-01-22 DIAGNOSIS — G4720 Circadian rhythm sleep disorder, unspecified type: Secondary | ICD-10-CM

## 2013-01-30 ENCOUNTER — Ambulatory Visit (INDEPENDENT_AMBULATORY_CARE_PROVIDER_SITE_OTHER): Payer: 59 | Admitting: Internal Medicine

## 2013-01-30 ENCOUNTER — Other Ambulatory Visit (INDEPENDENT_AMBULATORY_CARE_PROVIDER_SITE_OTHER): Payer: 59

## 2013-01-30 ENCOUNTER — Encounter: Payer: Self-pay | Admitting: Internal Medicine

## 2013-01-30 VITALS — BP 120/82 | HR 77 | Temp 98.0°F | Wt 285.0 lb

## 2013-01-30 DIAGNOSIS — G4733 Obstructive sleep apnea (adult) (pediatric): Secondary | ICD-10-CM | POA: Insufficient documentation

## 2013-01-30 DIAGNOSIS — Z0000 Encounter for general adult medical examination without abnormal findings: Secondary | ICD-10-CM

## 2013-01-30 LAB — CBC WITH DIFFERENTIAL/PLATELET
Basophils Relative: 0.5 % (ref 0.0–3.0)
Eosinophils Absolute: 0.1 10*3/uL (ref 0.0–0.7)
HCT: 38 % (ref 36.0–46.0)
Hemoglobin: 12.5 g/dL (ref 12.0–15.0)
Lymphs Abs: 2 10*3/uL (ref 0.7–4.0)
MCHC: 32.9 g/dL (ref 30.0–36.0)
MCV: 80 fl (ref 78.0–100.0)
Monocytes Absolute: 0.4 10*3/uL (ref 0.1–1.0)
Neutro Abs: 4.4 10*3/uL (ref 1.4–7.7)
Neutrophils Relative %: 62.8 % (ref 43.0–77.0)
RBC: 4.74 Mil/uL (ref 3.87–5.11)

## 2013-01-30 LAB — URINALYSIS, ROUTINE W REFLEX MICROSCOPIC
Bilirubin Urine: NEGATIVE
Ketones, ur: NEGATIVE
Total Protein, Urine: NEGATIVE
Urine Glucose: NEGATIVE
Urobilinogen, UA: 0.2 (ref 0.0–1.0)

## 2013-01-30 LAB — LDL CHOLESTEROL, DIRECT: Direct LDL: 142 mg/dL

## 2013-01-30 LAB — HEPATIC FUNCTION PANEL
Albumin: 3.6 g/dL (ref 3.5–5.2)
Alkaline Phosphatase: 106 U/L (ref 39–117)

## 2013-01-30 LAB — BASIC METABOLIC PANEL
CO2: 27 mEq/L (ref 19–32)
Chloride: 104 mEq/L (ref 96–112)
Creatinine, Ser: 0.9 mg/dL (ref 0.4–1.2)
Potassium: 4.2 mEq/L (ref 3.5–5.1)

## 2013-01-30 LAB — LIPID PANEL
Cholesterol: 222 mg/dL — ABNORMAL HIGH (ref 0–200)
Total CHOL/HDL Ratio: 4
VLDL: 32.8 mg/dL (ref 0.0–40.0)

## 2013-01-30 MED ORDER — DEXLANSOPRAZOLE 60 MG PO CPDR: DELAYED_RELEASE_CAPSULE | ORAL | Status: DC

## 2013-01-30 NOTE — Assessment & Plan Note (Signed)
Wt Readings from Last 3 Encounters:  01/30/13 285 lb (129.275 kg)  01/23/13 285 lb (129.275 kg)  01/06/13 285 lb (129.275 kg)   The patient is asked to make an attempt to improve diet and exercise patterns to aid in medical management of this problem. Pt will investigate possible bariatric options - i will medically support her efforts on this as needed

## 2013-01-30 NOTE — Patient Instructions (Signed)
It was good to see you today. Health Maintenance reviewed - all recommended immunizations and age-appropriate screenings are up-to-date. Test(s) ordered today. Your results will be released to MyChart (or called to you) after review, usually within 72hours after test completion. If any changes need to be made, you will be notified at that same time. Medications reviewed and updated, no changes recommended at this time. Refill on medication(s) as discussed today. we'll make referral to sleep specialist (in pulmonary). Our office will contact you regarding appointment(s) once made. Please schedule followup in 6 months for weight check, call sooner if problems.  Health Maintenance, Females A healthy lifestyle and preventative care can promote health and wellness.  Maintain regular health, dental, and eye exams.  Eat a healthy diet. Foods like vegetables, fruits, whole grains, low-fat dairy products, and lean protein foods contain the nutrients you need without too many calories. Decrease your intake of foods high in solid fats, added sugars, and salt. Get information about a proper diet from your caregiver, if necessary.  Regular physical exercise is one of the most important things you can do for your health. Most adults should get at least 150 minutes of moderate-intensity exercise (any activity that increases your heart rate and causes you to sweat) each week. In addition, most adults need muscle-strengthening exercises on 2 or more days a week.   Maintain a healthy weight. The body mass index (BMI) is a screening tool to identify possible weight problems. It provides an estimate of body fat based on height and weight. Your caregiver can help determine your BMI, and can help you achieve or maintain a healthy weight. For adults 20 years and older:  A BMI below 18.5 is considered underweight.  A BMI of 18.5 to 24.9 is normal.  A BMI of 25 to 29.9 is considered overweight.  A BMI of 30 and  above is considered obese.  Maintain normal blood lipids and cholesterol by exercising and minimizing your intake of saturated fat. Eat a balanced diet with plenty of fruits and vegetables. Blood tests for lipids and cholesterol should begin at age 12 and be repeated every 5 years. If your lipid or cholesterol levels are high, you are over 50, or you are a high risk for heart disease, you may need your cholesterol levels checked more frequently.Ongoing high lipid and cholesterol levels should be treated with medicines if diet and exercise are not effective.  If you smoke, find out from your caregiver how to quit. If you do not use tobacco, do not start.  If you are pregnant, do not drink alcohol. If you are breastfeeding, be very cautious about drinking alcohol. If you are not pregnant and choose to drink alcohol, do not exceed 1 drink per day. One drink is considered to be 12 ounces (355 mL) of beer, 5 ounces (148 mL) of wine, or 1.5 ounces (44 mL) of liquor.  Avoid use of street drugs. Do not share needles with anyone. Ask for help if you need support or instructions about stopping the use of drugs.  High blood pressure causes heart disease and increases the risk of stroke. Blood pressure should be checked at least every 1 to 2 years. Ongoing high blood pressure should be treated with medicines, if weight loss and exercise are not effective.  If you are 40 to 47 years old, ask your caregiver if you should take aspirin to prevent strokes.  Diabetes screening involves taking a blood sample to check your fasting blood sugar  level. This should be done once every 3 years, after age 88, if you are within normal weight and without risk factors for diabetes. Testing should be considered at a younger age or be carried out more frequently if you are overweight and have at least 1 risk factor for diabetes.  Breast cancer screening is essential preventative care for women. You should practice "breast  self-awareness." This means understanding the normal appearance and feel of your breasts and may include breast self-examination. Any changes detected, no matter how small, should be reported to a caregiver. Women in their 55s and 30s should have a clinical breast exam (CBE) by a caregiver as part of a regular health exam every 1 to 3 years. After age 25, women should have a CBE every year. Starting at age 23, women should consider having a mammogram (breast X-ray) every year. Women who have a family history of breast cancer should talk to their caregiver about genetic screening. Women at a high risk of breast cancer should talk to their caregiver about having an MRI and a mammogram every year.  The Pap test is a screening test for cervical cancer. Women should have a Pap test starting at age 67. Between ages 4 and 92, Pap tests should be repeated every 2 years. Beginning at age 46, you should have a Pap test every 3 years as long as the past 3 Pap tests have been normal. If you had a hysterectomy for a problem that was not cancer or a condition that could lead to cancer, then you no longer need Pap tests. If you are between ages 3 and 11, and you have had normal Pap tests going back 10 years, you no longer need Pap tests. If you have had past treatment for cervical cancer or a condition that could lead to cancer, you need Pap tests and screening for cancer for at least 20 years after your treatment. If Pap tests have been discontinued, risk factors (such as a new sexual partner) need to be reassessed to determine if screening should be resumed. Some women have medical problems that increase the chance of getting cervical cancer. In these cases, your caregiver may recommend more frequent screening and Pap tests.  The human papillomavirus (HPV) test is an additional test that may be used for cervical cancer screening. The HPV test looks for the virus that can cause the cell changes on the cervix. The cells  collected during the Pap test can be tested for HPV. The HPV test could be used to screen women aged 67 years and older, and should be used in women of any age who have unclear Pap test results. After the age of 40, women should have HPV testing at the same frequency as a Pap test.  Colorectal cancer can be detected and often prevented. Most routine colorectal cancer screening begins at the age of 25 and continues through age 77. However, your caregiver may recommend screening at an earlier age if you have risk factors for colon cancer. On a yearly basis, your caregiver may provide home test kits to check for hidden blood in the stool. Use of a small camera at the end of a tube, to directly examine the colon (sigmoidoscopy or colonoscopy), can detect the earliest forms of colorectal cancer. Talk to your caregiver about this at age 34, when routine screening begins. Direct examination of the colon should be repeated every 5 to 10 years through age 55, unless early forms of pre-cancerous polyps or  small growths are found.  Hepatitis C blood testing is recommended for all people born from [birth year] through 1965 and any individual with known risks for hepatitis C.  Practice safe sex. Use condoms and avoid high-risk sexual practices to reduce the spread of sexually transmitted infections (STIs). Sexually active women aged 107 and younger should be checked for Chlamydia, which is a common sexually transmitted infection. Older women with new or multiple partners should also be tested for Chlamydia. Testing for other STIs is recommended if you are sexually active and at increased risk.  Osteoporosis is a disease in which the bones lose minerals and strength with aging. This can result in serious bone fractures. The risk of osteoporosis can be identified using a bone density scan. Women ages 76 and over and women at risk for fractures or osteoporosis should discuss screening with their caregivers. Ask your caregiver  whether you should be taking a calcium supplement or vitamin D to reduce the rate of osteoporosis.  Menopause can be associated with physical symptoms and risks. Hormone replacement therapy is available to decrease symptoms and risks. You should talk to your caregiver about whether hormone replacement therapy is right for you.  Use sunscreen with a sun protection factor (SPF) of 30 or greater. Apply sunscreen liberally and repeatedly throughout the day. You should seek shade when your shadow is shorter than you. Protect yourself by wearing long sleeves, pants, a wide-brimmed hat, and sunglasses year round, whenever you are outdoors.  Notify your caregiver of new moles or changes in moles, especially if there is a change in shape or color. Also notify your caregiver if a mole is larger than the size of a pencil eraser.  Stay current with your immunizations. Document Released: 02/06/2011 Document Revised: 10/16/2011 Document Reviewed: 02/06/2011 [facility] Patient Information 2014 [place], [place]. Exercise to Lose Weight Exercise and a healthy diet may help you lose weight. Your doctor may suggest specific exercises. EXERCISE IDEAS AND TIPS  Choose low-cost things you enjoy doing, such as walking, bicycling, or exercising to workout videos.  Take stairs instead of the elevator.  Walk during your lunch break.  Park your car further away from work or school.  Go to a gym or an exercise class.  Start with 5 to 10 minutes of exercise each day. Build up to 30 minutes of exercise 4 to 6 days a week.  Wear shoes with good support and comfortable clothes.  Stretch before and after working out.  Work out until you breathe harder and your heart beats faster.  Drink extra water when you exercise.  Do not do so much that you hurt yourself, feel dizzy, or get very short of breath. Exercises that burn about 150 calories:  Running 1  miles in 15 minutes.  Playing volleyball for 45 to 60  minutes.  Washing and waxing a car for 45 to 60 minutes.  Playing touch football for 45 minutes.  Walking 1  miles in 35 minutes.  Pushing a stroller 1  miles in 30 minutes.  Playing basketball for 30 minutes.  Raking leaves for 30 minutes.  Bicycling 5 miles in 30 minutes.  Walking 2 miles in 30 minutes.  Dancing for 30 minutes.  Shoveling snow for 15 minutes.  Swimming laps for 20 minutes.  Walking up stairs for 15 minutes.  Bicycling 4 miles in 15 minutes.  Gardening for 30 to 45 minutes.  Jumping rope for 15 minutes.  Washing windows or floors for 45 to 60 minutes.  Document Released: 08/26/2010 Document Revised: 10/16/2011 Document Reviewed: 08/26/2010 [facility] Patient Information 2014 [place], [place]. Exercise to Lose Weight Exercise and a healthy diet may help you lose weight. Your doctor may suggest specific exercises. EXERCISE IDEAS AND TIPS  Choose low-cost things you enjoy doing, such as walking, bicycling, or exercising to workout videos.  Take stairs instead of the elevator.  Walk during your lunch break.  Park your car further away from work or school.  Go to a gym or an exercise class.  Start with 5 to 10 minutes of exercise each day. Build up to 30 minutes of exercise 4 to 6 days a week.  Wear shoes with good support and comfortable clothes.  Stretch before and after working out.  Work out until you breathe harder and your heart beats faster.  Drink extra water when you exercise.  Do not do so much that you hurt yourself, feel dizzy, or get very short of breath. Exercises that burn about 150 calories:  Running 1  miles in 15 minutes.  Playing volleyball for 45 to 60 minutes.  Washing and waxing a car for 45 to 60 minutes.  Playing touch football for 45 minutes.  Walking 1  miles in 35 minutes.  Pushing a stroller 1  miles in 30 minutes.  Playing basketball for 30 minutes.  Raking leaves for 30 minutes.  Bicycling 5  miles in 30 minutes.  Walking 2 miles in 30 minutes.  Dancing for 30 minutes.  Shoveling snow for 15 minutes.  Swimming laps for 20 minutes.  Walking up stairs for 15 minutes.  Bicycling 4 miles in 15 minutes.  Gardening for 30 to 45 minutes.  Jumping rope for 15 minutes.  Washing windows or floors for 45 to 60 minutes. Document Released: 08/26/2010 Document Revised: 10/16/2011 Document Reviewed: 08/26/2010 [facility] Patient Information 2014 [place], [place].

## 2013-01-30 NOTE — Progress Notes (Signed)
Subjective:    Patient ID: Alicia Chen, female    DOB: [birth year], 47 y.o.   MRN: [id number]  HPI  patient is here today for annual physical. Patient feels well overall.  Also ?apnea noted during EGD 12/2012  Past Medical History  Diagnosis Date  . ARTHRITIS   . MIGRAINE HEADACHE   . OBESITY, MORBID   . ALLERGIC RHINITIS   . ASTHMA   . GERD   . DEPRESSION   . ANEMIA-NOS    Family History  Problem Relation Age of Onset  . Arthritis Mother   . Arthritis Father   . Alcohol abuse Other     Parents  . Arthritis Other     grandparents  . Diabetes Other     Parent & grandparent  . Hyperlipidemia Other     Parent, grandparent, other relative  . Hypertension Other     parent, grandparent, other relative  . Prostate cancer Other     grandfather   History  Substance Use Topics  . Smoking status: Never Smoker   . 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 Alcohol Use: Yes     Comment: Social    Review of Systems  Constitutional: Negative for fever or weight change.  Respiratory: Negative for cough and shortness of breath.   Cardiovascular: Negative for chest pain or palpitations.  Gastrointestinal: Negative for abdominal pain, no bowel changes.  Musculoskeletal: Negative for gait problem or joint swelling.  Skin: Negative for rash.  Neurological: Negative for dizziness or headache.  No other specific complaints in a complete review of systems (except as listed in HPI above).     Objective:   Physical Exam  BP 120/82  Pulse 77  Temp(Src) 98 F (36.7 C) (Oral)  Wt 285 lb (129.275 kg)  BMI 50.5 kg/m2  SpO2 99% Wt Readings from Last 3 Encounters:  01/30/13 285 lb (129.275 kg)  01/23/13 285 lb (129.275 kg)  01/06/13 285 lb (129.275 kg)   Constitutional: She is obese, but appears well-developed and well-nourished. No distress.  HENT: Head: Normocephalic and atraumatic. Ears: B  TMs ok, no erythema or effusion; Nose: Nose normal. Mouth/Throat: Oropharynx is clear and moist. No oropharyngeal exudate.  Eyes: Conjunctivae and EOM are normal. Pupils are equal, round, and reactive to light. No scleral icterus.  Neck: Normal range of motion. Neck supple. No JVD present. No thyromegaly present.  Cardiovascular: Normal rate, regular rhythm and normal heart sounds.  No murmur heard. trace dep BLE edema. Pulmonary/Chest: Effort normal and breath sounds normal. No respiratory distress. She has no wheezes.  Abdominal: Soft. Bowel sounds are normal. She exhibits no distension. There is no tenderness. no masses GU: defer to gyn Musculoskeletal: Normal range of motion, no joint effusions. No gross deformities Neurological: She is alert and oriented to person, place, and time. No cranial nerve deficit. Coordination normal.  Skin: Skin is warm and dry. No rash noted. No erythema.  Psychiatric: She has a dysphoric, tired mood and affect. Her behavior is normal. Judgment and thought content normal.   Lab Results  Component Value Date   WBC 8.1 08/21/2012   HGB 11.9* 08/21/2012   HCT 37.0 08/21/2012   PLT 360 08/21/2012   GLUCOSE 89 08/21/2012   CHOL 216* 01/25/2012   TRIG 91.0 01/25/2012   HDL 52.70 01/25/2012   LDLDIRECT 149.5 01/25/2012   LDLCALC 126* 09/22/2010   ALT 12 08/21/2012  AST 13 08/21/2012   NA 135 08/21/2012   K 4.0 08/21/2012   CL 100 08/21/2012   CREATININE 1.02 08/21/2012   BUN 11 08/21/2012   CO2 25 08/21/2012   TSH 1.04 01/25/2012    ECG: sinus @ 76bpm - low voltage, no ischemic changes - unchanged from 01/2012    Assessment & Plan:  CPX/v70.0 - Patient has been counseled on age-appropriate routine health concerns for screening and prevention. These are reviewed and up-to-date. Immunizations are up-to-date or declined. Labs ordered, will be reviewed and ECG reviewed.  Presumed OSA based on apnea spells during anesthesia for EGD - sleep study completed 6/19 but results  not yet available - refer to sleep/pulm for further eval of apnea pending results - pt aware of relation in weight and importance of weight loss to manage same  Also see problem list. Medications and labs reviewed today.

## 2013-01-31 ENCOUNTER — Telehealth: Payer: Self-pay | Admitting: Neurology

## 2013-01-31 DIAGNOSIS — G4733 Obstructive sleep apnea (adult) (pediatric): Secondary | ICD-10-CM

## 2013-01-31 NOTE — Telephone Encounter (Signed)
Please call and notify the patient that the recent sleep study did confirm the diagnosis of obstructive sleep apnea and that I recommend treatment for this in the form of CPAP. This will require a repeat sleep study for proper titration and mask fitting. Please explain to patient and arrange for a CPAP titration study. I have placed an order in the chart. Thanks, Oluwatomisin Hustead, MD, PhD [facility] ([facility])  

## 2013-02-05 ENCOUNTER — Encounter: Payer: Self-pay | Admitting: *Deleted

## 2013-02-05 DIAGNOSIS — G4733 Obstructive sleep apnea (adult) (pediatric): Secondary | ICD-10-CM | POA: Insufficient documentation

## 2013-02-05 NOTE — Telephone Encounter (Signed)
Left message for patient regarding sleep study results, asked patient to call me back to discuss results and have questions answered.  Explained that a copy of the sleep study was sent to referring physician and copy of study is coming to them in the mail.  Briefly explained that patient would need to return for CPAP Titration study and that could be scheduled by calling us, also explained that if patient wanted to discuss results with Dr. Frances Furbish before proceeding, a follow up appointment could be scheduled by calling this number as well. -sh

## 2013-02-05 NOTE — Progress Notes (Signed)
See media tab for full report  

## 2013-02-26 ENCOUNTER — Ambulatory Visit (INDEPENDENT_AMBULATORY_CARE_PROVIDER_SITE_OTHER): Payer: 59 | Admitting: Pulmonary Disease

## 2013-02-26 ENCOUNTER — Encounter: Payer: Self-pay | Admitting: Pulmonary Disease

## 2013-02-26 VITALS — BP 124/88 | HR 78 | Temp 98.1°F | Ht 63.25 in | Wt 293.8 lb

## 2013-02-26 DIAGNOSIS — G4733 Obstructive sleep apnea (adult) (pediatric): Secondary | ICD-10-CM

## 2013-02-26 NOTE — Patient Instructions (Addendum)
You have mild sleep apnea that can affect quality of life, but is not a significant health risk for you at this time.   Consider a trial of weight loss alone, dental appliance, and cpap.  Please call me with your decision after you have considered.

## 2013-02-26 NOTE — Assessment & Plan Note (Signed)
The patient has been diagnosed with mild obstructive sleep apnea, and I have explained to her this can impact her quality of life, but is not a significant health risk for her.  Therefore, the decision to treat this aggressively should be based upon its impact to her quality of life.  I have reviewed conservative options such as a trial of weight loss, versus more aggressive options such as dental appliance or CPAP.  I do not think upper airway surgery is in her best interest.  The patient at this time is leaning toward a 6 month trial of weight loss, but she would like to consider her various options and will call me with her decision.

## 2013-02-26 NOTE — Progress Notes (Signed)
Subjective:    Patient ID: Alicia Chen, female    DOB: 11-08-1965, 47 y.o.   MRN: [id number]  HPI The patient is a 47 year old female who I have been asked to see for an abnormal sleep study.  She recently had a study last month, that showed mild OSA, with an AHI of 14 events per hour and oxygen desaturation transiently as low as 85%.  The patient has been noted to have snoring as well as an occasional abnormal breathing pattern, but she denies having frequent awakenings.  She feels rested most mornings whenever she arises, and denies any inappropriate daytime sleepiness except when she has a short total sleep time or occasionally late in the afternoon when doing inactive things.  She denies any sleepiness issues in the evening watching television or reading.  She has no sleepiness with driving.  The patient states that her weight is up 30-40 pounds over the last 2 years, and her Epworth score today is 6   Sleep Questionnaire What time do you typically go to bed?( Between what hours) 11p 11p at 1025 on 02/26/13 by Nita Sells, CMA How long does it take you to fall asleep? 15-6mins 15-44mins at 1025 on 02/26/13 by Nita Sells, CMA How many times during the night do you wake up? 2 2 at 1025 on 02/26/13 by Nita Sells, CMA What time do you get out of bed to start your day? [id number] 6-630a at 1025 on 02/26/13 by Nita Sells, CMA Do you drive or operate heavy machinery in your occupation? No No at 1025 on 02/26/13 by Nita Sells, CMA How much has your weight changed (up or down) over the past two years? (In pounds) 40 lb (18.144 kg)40 lb (18.144 kg) increase at 1025 on 02/26/13 by Nita Sells, CMA Have you ever had a sleep study before? Yes Yes at 1025 on 02/26/13 by Nita Sells, CMA If yes, location of study? [facility] at 1025 on 02/26/13 by Nita Sells, CMA If yes, date of study? 01/2013 01/2013 at 1025 on 02/26/13 by Nita Sells, CMA Do you  currently use CPAP? No No at 1025 on 02/26/13 by Marjo Bicker Mabe, CMA Do you wear oxygen at any time? No No at 1025 on 02/26/13 by Marjo Bicker Mabe, CMA    Review of Systems  Constitutional: Negative for fever and unexpected weight change.  HENT: Positive for congestion, rhinorrhea, sneezing and postnasal drip. Negative for ear pain, nosebleeds, sore throat, trouble swallowing, dental problem and sinus pressure.   Eyes: Negative for redness and itching.  Respiratory: Positive for cough. Negative for chest tightness, shortness of breath and wheezing.   Cardiovascular: Positive for palpitations and leg swelling.  Gastrointestinal: Negative for nausea and vomiting.  Genitourinary: Negative for dysuria.  Musculoskeletal: Negative for joint swelling.  Skin: Negative for rash.  Neurological: Negative for headaches.  Hematological: Does not bruise/bleed easily.  Psychiatric/Behavioral: Negative for dysphoric mood. The patient is not nervous/anxious.        Objective:   Physical Exam Constitutional:  Obese female, no acute distress  HENT:  Nares patent without discharge, mild turbinate hypertrophy  Oropharynx without exudate, palate and uvula are thick and elongated.   Eyes:  Perrla, eomi, no scleral icterus  Neck:  No JVD, no TMG  Cardiovascular:  Normal rate, regular rhythm, no rubs or gallops.  No murmurs        Intact distal pulses  Pulmonary :  Normal breath sounds, no stridor or respiratory distress   No rales, rhonchi, or wheezing  Abdominal:  Soft, nondistended, bowel sounds present.  No tenderness noted.   Musculoskeletal:  significant lower extremity edema noted.  Lymph Nodes:  No cervical lymphadenopathy noted  Skin:  No cyanosis noted  Neurologic:  Alert, appropriate, moves all 4 extremities without obvious deficit.         Assessment & Plan:

## 2013-03-17 ENCOUNTER — Ambulatory Visit: Payer: 59 | Admitting: Internal Medicine

## 2013-03-17 DIAGNOSIS — Z0289 Encounter for other administrative examinations: Secondary | ICD-10-CM

## 2013-03-21 ENCOUNTER — Ambulatory Visit (INDEPENDENT_AMBULATORY_CARE_PROVIDER_SITE_OTHER): Payer: 59 | Admitting: Internal Medicine

## 2013-03-21 ENCOUNTER — Encounter: Payer: Self-pay | Admitting: Internal Medicine

## 2013-03-21 VITALS — BP 120/82 | HR 77 | Temp 98.9°F | Wt 290.8 lb

## 2013-03-21 DIAGNOSIS — N611 Abscess of the breast and nipple: Secondary | ICD-10-CM

## 2013-03-21 DIAGNOSIS — L02239 Carbuncle of trunk, unspecified: Secondary | ICD-10-CM

## 2013-03-21 MED ORDER — SULFAMETHOXAZOLE-TMP DS 800-160 MG PO TABS: 1.0000 | ORAL_TABLET | Freq: Two times a day (BID) | ORAL | Status: DC

## 2013-03-21 NOTE — Progress Notes (Signed)
  Subjective:    Patient ID: Alicia Chen, female    DOB: 04-18-1966, 47 y.o.   MRN: [id number]  HPI  complains of boil under R breast  Past Medical History  Diagnosis Date  . ARTHRITIS   . MIGRAINE HEADACHE   . OBESITY, MORBID   . ALLERGIC RHINITIS   . ASTHMA   . GERD   . DEPRESSION   . ANEMIA-NOS     Review of Systems  Constitutional: Negative for fever and fatigue.       Objective:   Physical Exam BP 120/82  Pulse 77  Temp(Src) 98.9 F (37.2 C) (Oral)  Wt 290 lb 12.8 oz (131.906 kg)  BMI 51.08 kg/m2  SpO2 99% Wt Readings from Last 3 Encounters:  03/21/13 290 lb 12.8 oz (131.906 kg)  02/26/13 293 lb 12.8 oz (133.267 kg)  01/30/13 285 lb (129.275 kg)   Constitutional: She is obese, but appears well-developed and well-nourished. No distress. Cardiovascular: Normal rate, regular rhythm and normal heart sounds.  No murmur heard. No BLE edema. Pulmonary/Chest: Effort normal and breath sounds normal. No respiratory distress. She has no wheezes.  Skin: 12 mm x 5 mm noninflamed boil underside of R breast - no active drainage, fluctuant - no surrounding cellulitis - remaining skin is warm and dry. No rash noted. No erythema.  Psychiatric: She has a normal mood and affect. Her behavior is normal. Judgment and thought content normal.   Lab Results  Component Value Date   WBC 7.0 01/30/2013   HGB 12.5 01/30/2013   HCT 38.0 01/30/2013   PLT 353.0 01/30/2013   GLUCOSE 83 01/30/2013   CHOL 222* 01/30/2013   TRIG 164.0* 01/30/2013   HDL 52.80 01/30/2013   LDLDIRECT 142.0 01/30/2013   LDLCALC 126* 09/22/2010   ALT 13 01/30/2013   AST 12 01/30/2013   NA 138 01/30/2013   K 4.2 01/30/2013   CL 104 01/30/2013   CREATININE 0.9 01/30/2013   BUN 10 01/30/2013   CO2 27 01/30/2013   TSH 1.23 01/30/2013       Assessment & Plan:   Abscess, R breast - pt declines I&D today - tx with 7 d septra- conservative care and education provided Pt agrees to call if worse or unimproved

## 2013-03-21 NOTE — Patient Instructions (Signed)
It was good to see you today. We have reviewed your prior records including labs and tests today Antibiotics 2x/day x 1 week for your boil - Your prescription(s) have been submitted to your pharmacy. Please take as directed and contact our office if you believe you are having problem(s) with the medication(s). Your prescription(s) have been submitted to your pharmacy. Please take as directed and contact our office if you believe you are having problem(s) with the medication(s). Keep up your efforts with diet and exercise efforts  Abscess An abscess is an infected area that contains a collection of pus and debris.It can occur in almost any part of the body. An abscess is also known as a furuncle or boil. CAUSES  An abscess occurs when tissue gets infected. This can occur from blockage of oil or sweat glands, infection of hair follicles, or a minor injury to the skin. As the body tries to fight the infection, pus collects in the area and creates pressure under the skin. This pressure causes pain. People with weakened immune systems have difficulty fighting infections and get certain abscesses more often.  SYMPTOMS Usually an abscess develops on the skin and becomes a painful mass that is red, warm, and tender. If the abscess forms under the skin, you may feel a moveable soft area under the skin. Some abscesses break open (rupture) on their own, but most will continue to get worse without care. The infection can spread deeper into the body and eventually into the bloodstream, causing you to feel ill.  DIAGNOSIS  Your caregiver will take your medical history and perform a physical exam. A sample of fluid may also be taken from the abscess to determine what is causing your infection. TREATMENT  Your caregiver may prescribe antibiotic medicines to fight the infection. However, taking antibiotics alone usually does not cure an abscess. Your caregiver may need to make a small cut (incision) in the abscess to  drain the pus. In some cases, gauze is packed into the abscess to reduce pain and to continue draining the area. HOME CARE INSTRUCTIONS   Only take over-the-counter or prescription medicines for pain, discomfort, or fever as directed by your caregiver.  If you were prescribed antibiotics, take them as directed. Finish them even if you start to feel better.  If gauze is used, follow your caregiver's directions for changing the gauze.  To avoid spreading the infection:  Keep your draining abscess covered with a bandage.  Wash your hands well.  Do not share personal care items, towels, or whirlpools with others.  Avoid skin contact with others.  Keep your skin and clothes clean around the abscess.  Keep all follow-up appointments as directed by your caregiver. SEEK MEDICAL CARE IF:   You have increased pain, swelling, redness, fluid drainage, or bleeding.  You have muscle aches, chills, or a general ill feeling.  You have a fever. MAKE SURE YOU:   Understand these instructions.  Will watch your condition.  Will get help right away if you are not doing well or get worse. Document Released: 05/03/2005 Document Revised: 01/23/2012 Document Reviewed: 10/06/2011 [facility] Patient Information 2014 [place], [place].

## 2013-03-21 NOTE — Assessment & Plan Note (Signed)
Wt Readings from Last 3 Encounters:  03/21/13 290 lb 12.8 oz (131.906 kg)  02/26/13 293 lb 12.8 oz (133.267 kg)  01/30/13 285 lb (129.275 kg)   Reviewed ongoing diet and exercise changes - encouragement support

## 2013-04-07 ENCOUNTER — Other Ambulatory Visit: Payer: Self-pay | Admitting: Internal Medicine

## 2013-05-05 ENCOUNTER — Other Ambulatory Visit: Payer: 59

## 2013-05-05 ENCOUNTER — Encounter: Payer: Self-pay | Admitting: Internal Medicine

## 2013-05-05 ENCOUNTER — Ambulatory Visit (INDEPENDENT_AMBULATORY_CARE_PROVIDER_SITE_OTHER): Payer: 59 | Admitting: Internal Medicine

## 2013-05-05 VITALS — BP 120/84 | HR 81 | Temp 98.1°F

## 2013-05-05 DIAGNOSIS — N390 Urinary tract infection, site not specified: Secondary | ICD-10-CM

## 2013-05-05 LAB — POCT URINALYSIS DIPSTICK
Bilirubin, UA: NEGATIVE
Ketones, UA: NEGATIVE
Protein, UA: NEGATIVE
pH, UA: 5

## 2013-05-05 MED ORDER — CIPROFLOXACIN HCL 500 MG PO TABS: 500.0000 mg | ORAL_TABLET | Freq: Two times a day (BID) | ORAL | Status: DC

## 2013-05-05 NOTE — Progress Notes (Signed)
HPI: complains of UTI symptoms Onset 4 days ago, progressively worse associated with dysuria and small volume voiding with increased frequency + hematuria (but ?start of menses -on Merena and not predictable cycles), no flank pain or fever The patient has a history of prior UTI  PMH: reviewed  ROS:  Gen.: No unexpected weight change, no night sweats Lungs: No cough or shortness of breath Cardiovascular: No palpitations or chest pain  PE: BP 120/84  Pulse 81  Temp(Src) 98.1 F (36.7 C) (Oral)  SpO2 94% General: Obese, No acute distress Lungs: Clear to auscultation Cardiovascular: Regular rate rhythm, no edema Abdomen: Mild to moderate discomfort of her suprapubic region, no flank tenderness to palpation  Lab Results  Component Value Date   WBC 7.0 01/30/2013   HGB 12.5 01/30/2013   HCT 38.0 01/30/2013   PLT 353.0 01/30/2013   GLUCOSE 83 01/30/2013   CHOL 222* 01/30/2013   TRIG 164.0* 01/30/2013   HDL 52.80 01/30/2013   LDLDIRECT 142.0 01/30/2013   LDLCALC 126* 09/22/2010   ALT 13 01/30/2013   AST 12 01/30/2013   NA 138 01/30/2013   K 4.2 01/30/2013   CL 104 01/30/2013   CREATININE 0.9 01/30/2013   BUN 10 01/30/2013   CO2 27 01/30/2013   TSH 1.23 01/30/2013   POC - mod blood and LE  Assessment/Plan: UTI, classic symptoms with history of same  Empiric antibiotic x7 days Urine culture for identification and sensitivities Hydration recommended education provided

## 2013-05-05 NOTE — Patient Instructions (Addendum)
It was good to see you today. Cipro for bladder infection symptoms - 2x/day for 1 week - Your prescription(s) have been submitted to your pharmacy. Please take as directed and contact our office if you believe you are having problem(s) with the medication(s). Test(s) ordered today. Your results will be released to MyChart (or called to you) after review, usually within 72hours after test completion. If any changes need to be made, you will be notified at that same time.  Urinary Tract Infection Urinary tract infections (UTIs) can develop anywhere along your urinary tract. Your urinary tract is your body's drainage system for removing wastes and extra water. Your urinary tract includes two kidneys, two ureters, a bladder, and a urethra. Your kidneys are a pair of bean-shaped organs. Each kidney is about the size of your fist. They are located below your ribs, one on each side of your spine. CAUSES Infections are caused by microbes, which are microscopic organisms, including fungi, viruses, and bacteria. These organisms are so small that they can only be seen through a microscope. Bacteria are the microbes that most commonly cause UTIs. SYMPTOMS  Symptoms of UTIs may vary by age and gender of the patient and by the location of the infection. Symptoms in young women typically include a frequent and intense urge to urinate and a painful, burning feeling in the bladder or urethra during urination. Older women and men are more likely to be tired, shaky, and weak and have muscle aches and abdominal pain. A fever may mean the infection is in your kidneys. Other symptoms of a kidney infection include pain in your back or sides below the ribs, nausea, and vomiting. DIAGNOSIS To diagnose a UTI, your caregiver will ask you about your symptoms. Your caregiver also will ask to provide a urine sample. The urine sample will be tested for bacteria and white blood cells. White blood cells are made by your body to help  fight infection. TREATMENT  Typically, UTIs can be treated with medication. Because most UTIs are caused by a bacterial infection, they usually can be treated with the use of antibiotics. The choice of antibiotic and length of treatment depend on your symptoms and the type of bacteria causing your infection. HOME CARE INSTRUCTIONS  If you were prescribed antibiotics, take them exactly as your caregiver instructs you. Finish the medication even if you feel better after you have only taken some of the medication.  Drink enough water and fluids to keep your urine clear or pale yellow.  Avoid caffeine, tea, and carbonated beverages. They tend to irritate your bladder.  Empty your bladder often. Avoid holding urine for long periods of time.  Empty your bladder before and after sexual intercourse.  After a bowel movement, women should cleanse from front to back. Use each tissue only once. SEEK MEDICAL CARE IF:   You have back pain.  You develop a fever.  Your symptoms do not begin to resolve within 3 days. SEEK IMMEDIATE MEDICAL CARE IF:   You have severe back pain or lower abdominal pain.  You develop chills.  You have nausea or vomiting.  You have continued burning or discomfort with urination. MAKE SURE YOU:   Understand these instructions.  Will watch your condition.  Will get help right away if you are not doing well or get worse. Document Released: 05/03/2005 Document Revised: 01/23/2012 Document Reviewed: 09/01/2011 [facility] Patient Information 2014 [place], [place].

## 2013-05-06 LAB — URINE CULTURE

## 2013-05-19 ENCOUNTER — Ambulatory Visit (INDEPENDENT_AMBULATORY_CARE_PROVIDER_SITE_OTHER): Payer: 59 | Admitting: Obstetrics & Gynecology

## 2013-05-19 ENCOUNTER — Encounter: Payer: Self-pay | Admitting: Obstetrics & Gynecology

## 2013-05-19 VITALS — BP 115/89 | HR 74 | Temp 98.9°F | Ht 63.0 in | Wt 295.0 lb

## 2013-05-19 DIAGNOSIS — N760 Acute vaginitis: Secondary | ICD-10-CM

## 2013-05-19 LAB — POCT WET PREP (WET MOUNT)

## 2013-05-19 MED ORDER — METRONIDAZOLE 500 MG PO TABS: 500.0000 mg | ORAL_TABLET | Freq: Two times a day (BID) | ORAL | Status: DC

## 2013-05-19 NOTE — Patient Instructions (Signed)
Bacterial Vaginosis Bacterial vaginosis (BV) is a vaginal infection where the normal balance of bacteria in the vagina is disrupted. The normal balance is then replaced by an overgrowth of certain bacteria. There are several different kinds of bacteria that can cause BV. BV is the most common vaginal infection in women of childbearing age. CAUSES   The cause of BV is not fully understood. BV develops when there is an increase or imbalance of harmful bacteria.  Some activities or behaviors can upset the normal balance of bacteria in the vagina and put women at increased risk including:  Having a new sex partner or multiple sex partners.  Douching.  Using an intrauterine device (IUD) for contraception.  It is not clear what role sexual activity plays in the development of BV. However, women that have never had sexual intercourse are rarely infected with BV. Women do not get BV from toilet seats, bedding, swimming pools or from touching objects around them.  SYMPTOMS   Grey vaginal discharge.  A fish-like odor with discharge, especially after sexual intercourse.  Itching or burning of the vagina and vulva.  Burning or pain with urination.  Some women have no signs or symptoms at all. DIAGNOSIS  Your caregiver must examine the vagina for signs of BV. Your caregiver will perform lab tests and look at the sample of vaginal fluid through a microscope. They will look for bacteria and abnormal cells (clue cells), a pH test higher than 4.5, and a positive amine test all associated with BV.  RISKS AND COMPLICATIONS   Pelvic inflammatory disease (PID).  Infections following gynecology surgery.  Developing HIV.  Developing herpes virus. TREATMENT  Sometimes BV will clear up without treatment. However, all women with symptoms of BV should be treated to avoid complications, especially if gynecology surgery is planned. Female partners generally do not need to be treated. However, BV may spread  between female sex partners so treatment is helpful in preventing a recurrence of BV.   BV may be treated with antibiotics. The antibiotics come in either pill or vaginal cream forms. Either can be used with nonpregnant or pregnant women, but the recommended dosages differ. These antibiotics are not harmful to the baby.  BV can recur after treatment. If this happens, a second round of antibiotics will often be prescribed.  Treatment is important for pregnant women. If not treated, BV can cause a premature delivery, especially for a pregnant woman who had a premature birth in the past. All pregnant women who have symptoms of BV should be checked and treated.  For chronic reoccurrence of BV, treatment with a type of prescribed gel vaginally twice a week is helpful. HOME CARE INSTRUCTIONS   Finish all medication as directed by your caregiver.  Do not have sex until treatment is completed.  Tell your sexual partner that you have a vaginal infection. They should see their caregiver and be treated if they have problems, such as a mild rash or itching.  Practice safe sex. Use condoms. Only have 1 sex partner. PREVENTION  Basic prevention steps can help reduce the risk of upsetting the natural balance of bacteria in the vagina and developing BV:  Do not have sexual intercourse (be abstinent).  Do not douche.  Use all of the medicine prescribed for treatment of BV, even if the signs and symptoms go away.  Tell your sex partner if you have BV. That way, they can be treated, if needed, to prevent reoccurrence. SEEK MEDICAL CARE IF:     Your symptoms are not improving after 3 days of treatment.  You have increased discharge, pain, or fever. MAKE SURE YOU:   Understand these instructions.  Will watch your condition.  Will get help right away if you are not doing well or get worse. FOR MORE INFORMATION  Division of STD Prevention (DSTDP), Centers for Disease Control and Prevention:  www.cdc.gov/std American Social Health Association (ASHA): www.ashastd.org  Document Released: 07/24/2005 Document Revised: 10/16/2011 Document Reviewed: 01/14/2009 [employer] Patient Information 2014 [employer].  

## 2013-05-19 NOTE — Progress Notes (Signed)
Subjective:     Alicia Chen is a 47 y.o. female here for a routine exam.  Current complaints: Patient states she was seen at primary for symptoms- ?UTI, abdominal pressure, pressure at anus. Patient was treated for UTI and her symptoms are better. Personal health questionnaire reviewed: no. Patient is concerned about her weight. Patient thinks she might have a yeast infection.   Gynecologic History No LMP recorded. Patient is not currently having periods (Reason: IUD).   Obstetric History OB History  No data available     The following portions of the patient's history were reviewed and updated as appropriate: allergies, current medications, past family history, past medical history, past social history, past surgical history and problem list.  Review of Systems Pertinent items are noted in HPI.    Objective:     SPEC: thin, gray vaginal discharge     Assessment:   Bacterial vaginosis Plan:   Metronidazole Return prn

## 2013-07-02 ENCOUNTER — Ambulatory Visit: Payer: 59 | Admitting: Internal Medicine

## 2013-07-02 DIAGNOSIS — Z0289 Encounter for other administrative examinations: Secondary | ICD-10-CM

## 2013-07-08 ENCOUNTER — Encounter: Payer: 59 | Attending: Obstetrics & Gynecology | Admitting: Dietician

## 2013-07-08 ENCOUNTER — Encounter: Payer: Self-pay | Admitting: Dietician

## 2013-07-08 DIAGNOSIS — Z713 Dietary counseling and surveillance: Secondary | ICD-10-CM | POA: Insufficient documentation

## 2013-07-08 NOTE — Patient Instructions (Signed)
Eat one lunch out per week with coworkers and one dinner per week with daughters. Plan to walk 30-45 minutes before work 3 x week. Fill half of your plate with vegetables, limit starches to a quarter of the plate. Bring snacks to have available at work (about 2-3 snacks if you are hungry). Continue drinking mostly water. Write down what you are eating and comment on how foods make you feel - bloated, swollen, etc.

## 2013-07-08 NOTE — Progress Notes (Signed)
  Medical Nutrition Therapy:  Appt start time: 0845 end time:  0945.  Assessment:  Primary concerns today: Alicia Chen is here today for weight loss. She states she is frustrated by her weight and is considering bariatric surgery is she can't lose weight. Has a lot of swelling her legs and bloating in her stomach. Very interested in "getting on the right track". Current weight is her heaviest.   States she eats about 10 meals per week out. Lives with daughters and works 2 jobs. Trying to eat more meals and home and having some cereal and oatmeal in the morning. Thinks she lost about 5 pounds in the past 30 days by making some changes.  Preferred Learning Style:   No preference indicated   Learning Readiness:   Ready  MEDICATIONS: see list   DIETARY INTAKE:  Avoided foods include: ricotta cheese, cottage cheese   24-hr recall:  B ( AM): skips most of the time, 2 x week will have oatmeal instant flavored with 16 oz coffee with 6 splenda and 2 creamers   Snk ( AM): none  L (1 PM): [employer] potato soup in bread bowl with side salad and water Snk ( PM): none D (8-9PM): [employer] junior cheeseburger, small fries with water,usually will cook pasta and chicken with alfredo sauce with broccoli Snk ( PM):none Beverages: water, coffee, sweet tea occassionally   Usual physical activity: joined [employer], though has not worked out in the past 25 days, and sometimes will walk at lunch at work  Estimated energy needs: 1800 calories 200 g carbohydrates 135 g protein 50 g fat  Progress Towards Goal(s):  In progress.   Nutritional Diagnosis:  Alicia Chen-3.3 Overweight/obesity As related to hx of frequent restaurant meals.  As evidenced by BMI of 54.1.    Intervention:  Nutrition counseling provided. Made a plan with Alicia Chen to eat 2 meals out per week, start exercising, eating snacks, and eating breakfast. Recommended that she write foods down so that she can get a sense of what foods may cause her to feel  bloated.  Plan: Eat one lunch out per week with coworkers and one dinner per week with daughters. Plan to walk 30-45 minutes before work 3 x week. Fill half of your plate with vegetables, limit starches to a quarter of the plate. Bring snacks to have available at work (about 2-3 snacks if you are hungry). Continue drinking mostly water. Write down what you are eating and comment on how foods make you feel - bloated, swollen, etc.  Teaching Method Utilized:  Visual Auditory  Handouts given during visit include:  MyPlate   15 g CHO Snacks  Protein Shakes  Barriers to learning/adherence to lifestyle change: busy schedule, stress  Demonstrated degree of understanding via:  Teach Back   Monitoring/Evaluation:  Dietary intake, exercise, and body weight in 6 week(s).

## 2013-08-19 ENCOUNTER — Encounter: Payer: 59 | Attending: Internal Medicine | Admitting: Dietician

## 2013-08-21 ENCOUNTER — Ambulatory Visit: Payer: 59 | Admitting: Obstetrics & Gynecology

## 2013-08-21 ENCOUNTER — Ambulatory Visit (INDEPENDENT_AMBULATORY_CARE_PROVIDER_SITE_OTHER): Payer: 59 | Admitting: Obstetrics & Gynecology

## 2013-08-21 ENCOUNTER — Encounter: Payer: Self-pay | Admitting: Obstetrics & Gynecology

## 2013-08-21 VITALS — BP 134/84 | HR 92 | Temp 98.7°F | Ht 63.0 in | Wt 298.0 lb

## 2013-08-21 DIAGNOSIS — Z113 Encounter for screening for infections with a predominantly sexual mode of transmission: Secondary | ICD-10-CM

## 2013-08-21 DIAGNOSIS — Z01419 Encounter for gynecological examination (general) (routine) without abnormal findings: Secondary | ICD-10-CM

## 2013-08-21 DIAGNOSIS — R300 Dysuria: Secondary | ICD-10-CM

## 2013-08-21 NOTE — Patient Instructions (Signed)
Urinary Incontinence Urinary incontinence is the involuntary loss of urine from your bladder. CAUSES  There are many causes of urinary incontinence. They include:  Medicines.  Infections.  Prostatic enlargement, leading to overflow of urine from your bladder.  Surgery.  Neurological diseases.  Emotional factors. SIGNS AND SYMPTOMS Urinary Incontinence can be divided into four types: 1. Urge incontinence. Urge incontinence is the involuntary loss of urine before you have the opportunity to go to the bathroom. There is a sudden urge to void but not enough time to reach a bathroom. 2. Stress incontinence. Stress incontinence is the sudden loss of urine with any activity that forces urine to pass. It is commonly caused by anatomical changes to the pelvis and sphincter areas of your body. 3. Overflow incontinence. Overflow incontinence is the loss of urine from an obstructed opening to your bladder. This results in a backup of urine and a resultant buildup of pressure within the bladder. When the pressure within the bladder exceeds the closing pressure of the sphincter, the urine overflows, which causes incontinence, similar to water overflowing a dam. 4. Total incontinence. Total incontinence is the loss of urine as a result of the inability to store urine within your bladder. DIAGNOSIS  Evaluating the cause of incontinence may require:  A thorough and complete medical and obstetric history.  A complete physical exam.  Laboratory tests such as a urine culture and sensitivities. When additional tests are indicated, they can include:  An ultrasound exam.  Kidney and bladder X-rays.  Cystoscopy. This is an exam of the bladder using a narrow scope.  Urodynamic testing to test the nerve function to the bladder and sphincter areas. TREATMENT  Treatment for urinary incontinence depends on the cause:  For urge incontinence caused by a bacterial infection, antibiotics will be prescribed.  If the urge incontinence is related to medicines you take, your health care provider may have you change the medicine.  For stress incontinence, surgery to re-establish anatomical support to the bladder or sphincter, or both, will often correct the condition.  For overflow incontinence caused by an enlarged prostate, an operation to open the channel through the enlarged prostate will allow the flow of urine out of the bladder. In women with fibroids, a hysterectomy may be recommended.  For total incontinence, surgery on your urinary sphincter may help. An artificial urinary sphincter (an inflatable cuff placed around the urethra) may be required. In women who have developed a hole-like passage between their bladder and vagina (vesicovaginal fistula), surgery to close the fistula often is required. HOME CARE INSTRUCTIONS  Normal daily hygiene and the use of pads or adult diapers that are changed regularly will help prevent odors and skin damage.  Avoid caffeine. It can overstimulate your bladder.  Use the bathroom regularly. Try about every 2 3 hours to go to the bathroom, even if you do not feel the need to do so. Take time to empty your bladder completely. After urinating, wait a minute. Then try to urinate again.  For causes involving nerve dysfunction, keep a log of the medicines you take and a journal of the times you go to the bathroom. SEEK MEDICAL CARE IF:  You experience worsening of pain instead of improvement in pain after your procedure.  Your incontinence becomes worse instead of better. SEE IMMEDIATE MEDICAL CARE IF:  You experience fever or shaking chills.  You are unable to pass your urine.  You have redness spreading into your groin or down into your thighs. MAKE   SURE YOU:   Understand these instructions.   Will watch your condition.  Will get help right away if you are not doing well or get worse. Document Released: 08/31/2004 Document Revised: 05/14/2013 Document  Reviewed: 12/31/2012 [employer] Patient Information 2014 [employer].  

## 2013-08-21 NOTE — Progress Notes (Signed)
Subjective:     Alicia Chen is a 48 y.o. female here for a routine exam.  Current complaints:  Pt is concerned about her weight. Pt would like to discuss the swelling that she has along with weight loss. Pt states that she is thinking of weight loss surgery.  Pt is going to try weight loss plan and go to seminar at [facility].  Pt states that she is going to try her best to lose weight until June. Pt feels as though she may need a weight loss coach/advocate. Pt states that she seems to get bacterial infections close to time of her menstral cycle. Pt advised to call office if treatment is needed.  Pt states that she is under a lot of stress with work.  Personal health questionnaire reviewed: yes.   Gynecologic History No LMP recorded. Patient is not currently having periods (Reason: IUD). Contraception: IUD Last Pap: unsure. Results were: normal Last mammogram: needs mammogram.    Review of Systems Pertinent items are noted in HPI.    Objective:    General appearance: alert Breasts: normal appearance, no masses or tenderness Abdomen: soft, non-tender; bowel sounds normal; no masses,  no organomegaly Pelvic: cervix normal in appearance, external genitalia normal, bimanual exam limited by body habitus and vagina normal without discharge    Assessment:   Healthy female exam. GSUI Perimenopause   Plan:  Return in March for IUD removal and insertion Follow up in: 1 year. for annual with pap  Please remember to schedule mammogram Bring voiding diary at March visit for review Continue with kegel exercises Physiatrist referral

## 2013-08-22 LAB — URINALYSIS, ROUTINE W REFLEX MICROSCOPIC
Bilirubin Urine: NEGATIVE
GLUCOSE, UA: NEGATIVE mg/dL
KETONES UR: NEGATIVE mg/dL
Nitrite: NEGATIVE
Protein, ur: NEGATIVE mg/dL
Specific Gravity, Urine: 1.012 (ref 1.005–1.030)
UROBILINOGEN UA: 0.2 mg/dL (ref 0.0–1.0)
pH: 5.5 (ref 5.0–8.0)

## 2013-08-22 LAB — HIV ANTIBODY (ROUTINE TESTING W REFLEX): HIV: NONREACTIVE

## 2013-08-22 LAB — GC/CHLAMYDIA PROBE AMP
CT PROBE, AMP APTIMA: NEGATIVE
GC Probe RNA: NEGATIVE

## 2013-08-22 LAB — URINALYSIS, MICROSCOPIC ONLY
BACTERIA UA: NONE SEEN
CASTS: NONE SEEN
Crystals: NONE SEEN
Squamous Epithelial / LPF: NONE SEEN

## 2013-08-22 LAB — RPR

## 2013-08-23 LAB — URINE CULTURE
Colony Count: NO GROWTH
Organism ID, Bacteria: NO GROWTH

## 2013-08-30 ENCOUNTER — Other Ambulatory Visit: Payer: Self-pay | Admitting: Internal Medicine

## 2013-09-24 ENCOUNTER — Encounter: Payer: Self-pay | Admitting: Obstetrics & Gynecology

## 2013-09-29 ENCOUNTER — Other Ambulatory Visit: Payer: Self-pay | Admitting: Internal Medicine

## 2013-10-09 ENCOUNTER — Other Ambulatory Visit: Payer: Self-pay | Admitting: Internal Medicine

## 2013-10-15 ENCOUNTER — Ambulatory Visit: Payer: 59 | Admitting: Obstetrics & Gynecology

## 2013-11-06 ENCOUNTER — Ambulatory Visit: Payer: 59 | Admitting: Obstetrics & Gynecology

## 2013-11-06 ENCOUNTER — Ambulatory Visit (INDEPENDENT_AMBULATORY_CARE_PROVIDER_SITE_OTHER): Payer: 59 | Admitting: Obstetrics & Gynecology

## 2013-11-06 ENCOUNTER — Encounter: Payer: Self-pay | Admitting: Obstetrics & Gynecology

## 2013-11-06 DIAGNOSIS — Z30432 Encounter for removal of intrauterine contraceptive device: Secondary | ICD-10-CM

## 2013-11-06 NOTE — Progress Notes (Signed)
Subjective:     Alicia Chen is a 48 y.o. female here for a IUD removal exam.  Current complaints: pt is here today for IUD removal.  Pt states she has a return visit on 11/24/13 for IUD to be placed.     The HPI was reviewed and explored in further detail by the provider. Gynecologic History No LMP recorded. Patient is not currently having periods (Reason: IUD). Contraception: IUD   Obstetric History OB History  No data available     The following portions of the patient's history were reviewed and updated as appropriate: allergies, current medications, past family history, past medical history, past social history, past surgical history and problem list.  Review of Systems Pertinent items are noted in HPI.    Objective:     SPEC: IUD strings seen/  IUD removed intact       Assessment:   S/P IUD removal  Plan:   Return for Mirena IUD insertion

## 2013-11-17 ENCOUNTER — Other Ambulatory Visit: Payer: Self-pay | Admitting: Internal Medicine

## 2013-11-24 ENCOUNTER — Ambulatory Visit: Payer: 59 | Admitting: Obstetrics & Gynecology

## 2013-12-02 ENCOUNTER — Other Ambulatory Visit: Payer: Self-pay

## 2013-12-02 DIAGNOSIS — Z1231 Encounter for screening mammogram for malignant neoplasm of breast: Secondary | ICD-10-CM

## 2013-12-10 ENCOUNTER — Ambulatory Visit
Admission: RE | Admit: 2013-12-10 | Discharge: 2013-12-10 | Disposition: A | Discharge status: Home / Self Care | Payer: 59 | Source: Ambulatory Visit

## 2013-12-10 DIAGNOSIS — Z1231 Encounter for screening mammogram for malignant neoplasm of breast: Secondary | ICD-10-CM

## 2013-12-15 ENCOUNTER — Encounter: Payer: Self-pay | Admitting: Obstetrics & Gynecology

## 2013-12-15 ENCOUNTER — Ambulatory Visit (INDEPENDENT_AMBULATORY_CARE_PROVIDER_SITE_OTHER): Payer: 59 | Admitting: Obstetrics & Gynecology

## 2013-12-15 VITALS — BP 132/82 | HR 92 | Temp 98.6°F | Ht 63.0 in | Wt 308.0 lb

## 2013-12-15 DIAGNOSIS — Z3202 Encounter for pregnancy test, result negative: Secondary | ICD-10-CM

## 2013-12-15 DIAGNOSIS — Z30430 Encounter for insertion of intrauterine contraceptive device: Secondary | ICD-10-CM

## 2013-12-15 DIAGNOSIS — IMO0001 Reserved for inherently not codable concepts without codable children: Secondary | ICD-10-CM

## 2013-12-15 HISTORY — PX: INTRAUTERINE DEVICE (IUD) INSERTION: SHX5877

## 2013-12-16 LAB — POCT URINE PREGNANCY: PREG TEST UR: NEGATIVE

## 2013-12-21 NOTE — Patient Instructions (Signed)

## 2013-12-21 NOTE — Progress Notes (Signed)
IUD Insertion Procedure Note  Pre-operative Diagnosis: Requests LARC  Post-operative Diagnosis: same  Indications: contraception  Procedure Details  Urine pregnancy test was done and result was negative.  The risks (including infection, bleeding, pain, and uterine perforation) and benefits of the procedure were explained to the patient and Written informed consent was obtained.    Cervix cleansed with Betadine. Uterus sounded to 7 cm. IUD inserted without difficulty. String visible and trimmed. Patient tolerated procedure well.  IUD Information: Mirena.  Condition: Stable  Complications: None  Plan:  The patient was advised to call for any fever or for prolonged or severe pain or bleeding. She was advised to use OTC analgesics as needed for mild to moderate pain.

## 2014-01-15 ENCOUNTER — Encounter: Payer: Self-pay | Admitting: Internal Medicine

## 2014-01-15 ENCOUNTER — Ambulatory Visit (INDEPENDENT_AMBULATORY_CARE_PROVIDER_SITE_OTHER): Payer: 59 | Admitting: Internal Medicine

## 2014-01-15 VITALS — BP 130/84 | HR 84 | Temp 98.3°F | Wt 304.0 lb

## 2014-01-15 DIAGNOSIS — K219 Gastro-esophageal reflux disease without esophagitis: Secondary | ICD-10-CM

## 2014-01-15 DIAGNOSIS — J309 Allergic rhinitis, unspecified: Secondary | ICD-10-CM

## 2014-01-15 DIAGNOSIS — R490 Dysphonia: Secondary | ICD-10-CM

## 2014-01-15 MED ORDER — LORATADINE 10 MG PO TABS: 10.0000 mg | ORAL_TABLET | Freq: Every day | ORAL | Status: DC

## 2014-01-15 MED ORDER — FAMOTIDINE 20 MG PO TABS: 20.0000 mg | ORAL_TABLET | Freq: Two times a day (BID) | ORAL | Status: DC | PRN

## 2014-01-15 MED ORDER — DEXLANSOPRAZOLE 60 MG PO CPDR: DELAYED_RELEASE_CAPSULE | ORAL | Status: DC

## 2014-01-15 MED ORDER — LINACLOTIDE 145 MCG PO CAPS: 145.0000 ug | ORAL_CAPSULE | Freq: Every day | ORAL | Status: DC

## 2014-01-15 NOTE — Patient Instructions (Signed)
It was good to see you today.  We have reviewed your prior records including labs and tests today  Medications reviewed and updated Take Pepcid twice daily as needed in addition to Exelon to daily for reflux Okay for Claritin in place of Zyrtec and use no spray for allergies to help voice/hoarseness no changes recommended at this time. Refill on medication(s) as discussed today.  Please keep scheduled followup, call sooner if problems.

## 2014-01-15 NOTE — Assessment & Plan Note (Signed)
Abnormal CT during ER visit 08/2012 - S/p GI eval 09/06/12> EGD with gastric polyps Interval hx and reports reviewed Continue PPI and H2B prn

## 2014-01-15 NOTE — Assessment & Plan Note (Signed)
Wt Readings from Last 3 Encounters:  01/15/14 304 lb (137.893 kg)  12/15/13 308 lb (139.708 kg)  08/21/13 298 lb (135.172 kg)   Reviewed ongoing diet and exercise changes - encouragement support Encouraged to pursue consideration of bariatric intervention as planned

## 2014-01-15 NOTE — Progress Notes (Signed)
Subjective:    Patient ID: Alicia Chen, female    DOB: [birth year], 48 y.o.   MRN: [id number]  Abdominal Pain Pertinent negatives include no constipation, diarrhea, fever, nausea or vomiting.    Patient is here for follow up   Several concerns -  hoarseness x 2-3 weeks, improved today associated with sensation of PND and gag with swallow - white phlegm Not improved with allergy meds Not associated with sore throat   Weight - unable to lose despite diet changes and increase exertion (walking 1/2 block 2x/day) - applying for bariatric consideration  GERD - sometimes not controlled with PPI - "burn" and ache, n n,v or bowel change  Also reviewed chronic medical issues and interval medical events  Past Medical History  Diagnosis Date  . ARTHRITIS   . MIGRAINE HEADACHE   . OBESITY, MORBID   . ALLERGIC RHINITIS   . ASTHMA   . GERD   . DEPRESSION   . ANEMIA-NOS     Review of Systems  Constitutional: Positive for unexpected weight change. Negative for fever and fatigue.  HENT: Positive for postnasal drip, sinus pressure and voice change (x 2 weeks, better today). Negative for ear pain, rhinorrhea, sneezing, sore throat and trouble swallowing.   Cardiovascular: Negative for chest pain and leg swelling.  Gastrointestinal: Positive for abdominal pain. Negative for nausea, vomiting, diarrhea, constipation and blood in stool.       Objective:   Physical Exam  BP 130/84  Pulse 84  Temp(Src) 98.3 F (36.8 C) (Oral)  Wt 304 lb (137.893 kg)  SpO2 99% Wt Readings from Last 3 Encounters:  01/15/14 304 lb (137.893 kg)  12/15/13 308 lb (139.708 kg)  08/21/13 298 lb (135.172 kg)    Constitutional: She is MO, appears well-developed and well-nourished. No distress. voice NOT hoarse HENT:+PND in OP, no erythema or exudate Neck: thick. Normal range of motion. Neck supple. No JVD present. No thyromegaly present.  Cardiovascular: Normal rate, regular rhythm and normal heart sounds.   No murmur heard. No BLE edema. Pulmonary/Chest: Effort normal and breath sounds normal. No respiratory distress. She has no wheezes.  Abdomen: Obese, soft, nontender nondistended. Mild incisional herniation at gastric lap scar, reducible and nontender. +BS no r/g Psychiatric: She has a normal mood and affect. Her behavior is normal. Judgment and thought content normal.   Lab Results  Component Value Date   WBC 7.0 01/30/2013   HGB 12.5 01/30/2013   HCT 38.0 01/30/2013   PLT 353.0 01/30/2013   GLUCOSE 83 01/30/2013   CHOL 222* 01/30/2013   TRIG 164.0* 01/30/2013   HDL 52.80 01/30/2013   LDLDIRECT 142.0 01/30/2013   LDLCALC 126* 09/22/2010   ALT 13 01/30/2013   AST 12 01/30/2013   NA 138 01/30/2013   K 4.2 01/30/2013   CL 104 01/30/2013   CREATININE 0.9 01/30/2013   BUN 10 01/30/2013   CO2 27 01/30/2013   TSH 1.23 01/30/2013    Mm Screening Breast Tomo Bilateral  12/10/2013   CLINICAL DATA:  Screening.  EXAM: DIGITAL SCREENING BILATERAL MAMMOGRAM WITH 3D TOMO WITH CAD  COMPARISON:  Previous exam(s).  ACR Breast Density Category b: There are scattered areas of fibroglandular density.  FINDINGS: There are no findings suspicious for malignancy. Images were processed with CAD.  IMPRESSION: No mammographic evidence of malignancy. A result letter of this screening mammogram will be mailed directly to the patient.  RECOMMENDATION: Screening mammogram in one year. (Code:SM-B-01Y)  BI-RADS CATEGORY  1: Negative.  Electronically Signed   By: Hassan Rowan M.D.   On: 12/10/2013 14:13       Assessment & Plan:   Problem List Items Addressed This Visit   ALLERGIC RHINITIS     Continue OTC antihistamine and rx nasal steroid Treating this and GERD should help hoarseness as well    GERD     Abnormal CT during ER visit 08/2012 - S/p GI eval 09/06/12> EGD with gastric polyps Interval hx and reports reviewed Continue PPI and H2B prn    Relevant Medications      dexlansoprazole (DEXILANT) 60 MG capsule       linaclotide (LINZESS) capsule   OBESITY, MORBID      Wt Readings from Last 3 Encounters:  01/15/14 304 lb (137.893 kg)  12/15/13 308 lb (139.708 kg)  08/21/13 298 lb (135.172 kg)   Reviewed ongoing diet and exercise changes - encouragement support Encouraged to pursue consideration of bariatric intervention as planned     Other Visit Diagnoses   Hoarseness of voice    -  Primary

## 2014-01-15 NOTE — Progress Notes (Signed)
Pre visit review using our clinic review tool, if applicable. No additional management support is needed unless otherwise documented below in the visit note. 

## 2014-01-15 NOTE — Assessment & Plan Note (Signed)
Continue OTC antihistamine and rx nasal steroid Treating this and GERD should help hoarseness as well

## 2014-01-26 ENCOUNTER — Encounter: Payer: Self-pay | Admitting: Obstetrics & Gynecology

## 2014-01-26 ENCOUNTER — Ambulatory Visit (INDEPENDENT_AMBULATORY_CARE_PROVIDER_SITE_OTHER): Payer: 59 | Admitting: Obstetrics & Gynecology

## 2014-01-26 VITALS — BP 130/90 | HR 80 | Temp 97.4°F | Ht 63.0 in | Wt 301.0 lb

## 2014-01-26 DIAGNOSIS — E669 Obesity, unspecified: Secondary | ICD-10-CM

## 2014-01-26 DIAGNOSIS — Z30431 Encounter for routine checking of intrauterine contraceptive device: Secondary | ICD-10-CM

## 2014-01-26 LAB — HM PAP SMEAR

## 2014-01-26 MED ORDER — NALTREXONE-BUPROPION HCL ER 8-90 MG PO TB12: ORAL_TABLET | ORAL | Status: DC

## 2014-01-26 NOTE — Patient Instructions (Signed)
Obesity Obesity is defined as having too much total body fat and a body mass index (BMI) of 30 or more. BMI is an estimate of body fat and is calculated from your height and weight. Obesity happens when you consume more calories than you can burn by exercising or performing daily physical tasks. Prolonged obesity can cause major illnesses or emergencies, such as:   A stroke.  Heart disease.  Diabetes.  Cancer.  Arthritis.  High blood pressure (hypertension).  High cholesterol.  Sleep apnea.  Erectile dysfunction.  Infertility problems. CAUSES   Regularly eating unhealthy foods.  Physical inactivity.  Certain disorders, such as an underactive thyroid (hypothyroidism), Cushing's syndrome, and polycystic ovarian syndrome.  Certain medicines, such as steroids, some depression medicines, and antipsychotics.  Genetics.  Lack of sleep. DIAGNOSIS  A caregiver can diagnose obesity after calculating your BMI. Obesity will be diagnosed if your BMI is 30 or higher.  There are other methods of measuring obesity levels. Some other methods include measuring your skin fold thickness, your waist circumference, and comparing your hip circumference to your waist circumference. TREATMENT  A healthy treatment program includes some or all of the following:  Long-term dietary changes.  Exercise and physical activity.  Behavioral and lifestyle changes.  Medicine only under the supervision of your caregiver. Medicines may help, but only if they are used with diet and exercise programs. An unhealthy treatment program includes:  Fasting.  Fad diets.  Supplements and drugs. These choices do not succeed in long-term weight control.  HOME CARE INSTRUCTIONS   Exercise and perform physical activity as directed by your caregiver. To increase physical activity, try the following:  Use stairs instead of elevators.  Park farther away from store entrances.  Garden, bike, or walk instead of  watching television or using the computer.  Eat healthy, low-calorie foods and drinks on a regular basis. Eat more fruits and vegetables. Use low-calorie cookbooks or take healthy cooking classes.  Limit fast food, sweets, and processed snack foods.  Eat smaller portions.  Keep a daily journal of everything you eat. There are many free websites to help you with this. It may be helpful to measure your foods so you can determine if you are eating the correct portion sizes.  Avoid drinking alcohol. Drink more water and drinks without calories.  Take vitamins and supplements only as recommended by your caregiver.  Weight-loss support groups, Nurse, mental health, counselors, and stress reduction education can also be very helpful. SEEK IMMEDIATE MEDICAL CARE IF:  You have chest pain or tightness.  You have trouble breathing or feel short of breath.  You have weakness or leg numbness.  You feel confused or have trouble talking.  You have sudden changes in your vision. MAKE SURE YOU:  Understand these instructions.  Will watch your condition.  Will get help right away if you are not doing well or get worse. Document Released: 08/31/2004 Document Revised: 01/23/2012 Document Reviewed: 08/30/2011 [facility] Patient Information 2015 [place], [place]. This information is not intended to replace advice given to you by your health care Donald Jacque. Make sure you discuss any questions you have with your health care Tonya Wantz.

## 2014-01-26 NOTE — Progress Notes (Signed)
Patient ID: Alicia Chen, female   DOB: [birth year], 48 y.o.   MRN: [id number]  Chief Complaint  Patient presents with  . Follow-up     s/p Mirena IUD insertion    HPI Alicia Chen is a 48 y.o. female.  No complaints. Requests medication for weight loss.  HPI  Past Medical History  Diagnosis Date  . ARTHRITIS   . MIGRAINE HEADACHE   . OBESITY, MORBID   . ALLERGIC RHINITIS   . ASTHMA   . GERD   . DEPRESSION   . ANEMIA-NOS     Past Surgical History  Procedure Laterality Date  . Cholecystectomy  2009  . Skin graft surgery (r) leg      childhood  . Esophagogastroduodenoscopy N/A 12/23/2012    Procedure: ESOPHAGOGASTRODUODENOSCOPY (EGD);  Surgeon: Juanita Craver, MD;  Location: [facility];  Service: Endoscopy;  Laterality: N/A;    Family History  Problem Relation Age of Onset  . Arthritis Mother   . Arthritis Father   . Alcohol abuse Other     Parents  . Arthritis Other     grandparents  . Diabetes Other     Parent & grandparent  . Hyperlipidemia Other     Parent, grandparent, other relative  . Hypertension Other     parent, grandparent, other relative  . Prostate cancer Other     grandfather  . Breast cancer Sister     Social History History  Substance Use Topics  . Smoking status: Never Smoker   . 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 Alcohol Use: Yes     Comment: Social    Allergies  Allergen Reactions  . Penicillins Nausea And Vomiting    hives    Current Outpatient Prescriptions  Medication Sig Dispense Refill  . ALPRAZolam (XANAX) 0.5 MG tablet take 1 tablet by mouth three times a day if needed for sleep or anxiety  30 tablet  1  . cyclobenzaprine (FLEXERIL) 5 MG tablet take 1 tablet by mouth twice a day if needed for muscle spasm  30 tablet  1  . dexlansoprazole (DEXILANT) 60 MG capsule take capsule by mouth once daily  30 capsule  11  . famotidine (PEPCID) 20  MG tablet Take 1 tablet (20 mg total) by mouth 2 (two) times daily as needed for heartburn or indigestion.  60 tablet  1  . fluticasone (FLONASE) 50 MCG/ACT nasal spray Place 2 sprays into the nose as needed for allergies.       . hydrochlorothiazide (HYDRODIURIL) 25 MG tablet take 1/2 to 1 tablets by mouth once daily  30 tablet  3  . Linaclotide (LINZESS) 145 MCG CAPS capsule Take 1 capsule (145 mcg total) by mouth daily.  30 capsule  11  . loratadine (CLARITIN) 10 MG tablet Take 1 tablet (10 mg total) by mouth daily.  30 tablet  11  . meclizine (ANTIVERT) 25 MG tablet take 1 tablet by mouth three times a day if needed  30 tablet  1  . Multiple Vitamin (MULTIVITAMIN) tablet Take 1 tablet by mouth daily.      . NON FORMULARY Ultra flora IB take 1 tablet every day      . VENTOLIN HFA 108 (90 BASE) MCG/ACT inhaler inhale 2 puffs by mouth every 4 hours if needed  18 g  1  . Naltrexone-Bupropion HCl ER 8-90 MG TB12 Wk 1:1 tab po in the  AM. Wk 2:1 tab in the AM and PM Wk 3:2 tabs in the AM and 1 tab in the PM. Wk 4:2 tabs In AM and 2 tabs in PM.  70 tablet  0  . Naltrexone-Bupropion HCl ER 8-90 MG TB12 2 tablets by mouth in AM and 2 tablets by mouth in PM  112 tablet  1   No current facility-administered medications for this visit.    Review of Systems Review of Systems Constitutional: negative for fatigue and weight loss Respiratory: negative for cough and wheezing Cardiovascular: negative for chest pain, fatigue and palpitations Gastrointestinal: negative for abdominal pain and change in bowel habits Genitourinary:positive stress incontinence Integument/breast: negative for nipple discharge Musculoskeletal:negative for myalgias Neurological: negative for gait problems and tremors Behavioral/Psych: negative for abusive relationship, depression Endocrine: negative for temperature intolerance     Blood pressure 130/90, pulse 80, temperature 97.4 F (36.3 C), height 5\' 3"  (1.6 m), weight  136.533 kg (301 lb), last menstrual period 01/18/2014.  Physical Exam Physical Exam General:   alert  Abdomen:  normal findings: no organomegaly, soft, non-tender and no hernia  Pelvis:  External genitalia: normal general appearance Urinary system: urethral meatus normal and bladder without fullness, nontender Vaginal: normal without tenderness, induration or masses Cervix: normal ; IUD strings present Adnexa: normal bimanual exam Uterus: anteverted and non-tender, normal size       Data Reviewed None  Assessment    Normal IUD check Obesity, class III --requests pharmacologic management; BMI: 53; no other comorbidities/cardiovascular risk factors    Plan    Candidate for referral to bariatric surgery program Diet, exercise Meds ordered this encounter  Medications  . Naltrexone-Bupropion HCl ER 8-90 MG TB12    Sig: Wk 1:1 tab po in the AM. Wk 2:1 tab in the AM and PM Wk 3:2 tabs in the AM and 1 tab in the PM. Wk 4:2 tabs In AM and 2 tabs in PM.    Dispense:  70 tablet    Refill:  0  . Naltrexone-Bupropion HCl ER 8-90 MG TB12    Sig: 2 tablets by mouth in AM and 2 tablets by mouth in PM    Dispense:  112 tablet    Refill:  1    Follow up in 3 mths       JACKSON-MOORE,LISA A 01/26/2014, 9:32 PM

## 2014-01-27 ENCOUNTER — Other Ambulatory Visit: Payer: Self-pay | Admitting: *Deleted

## 2014-04-02 ENCOUNTER — Encounter: Payer: Self-pay | Admitting: Internal Medicine

## 2014-04-02 ENCOUNTER — Ambulatory Visit (INDEPENDENT_AMBULATORY_CARE_PROVIDER_SITE_OTHER): Payer: 59 | Admitting: Internal Medicine

## 2014-04-02 ENCOUNTER — Other Ambulatory Visit (INDEPENDENT_AMBULATORY_CARE_PROVIDER_SITE_OTHER): Payer: 59

## 2014-04-02 VITALS — BP 118/80 | HR 79 | Temp 98.2°F | Ht 63.0 in | Wt 302.2 lb

## 2014-04-02 DIAGNOSIS — Z0000 Encounter for general adult medical examination without abnormal findings: Secondary | ICD-10-CM

## 2014-04-02 DIAGNOSIS — F329 Major depressive disorder, single episode, unspecified: Secondary | ICD-10-CM

## 2014-04-02 DIAGNOSIS — F3289 Other specified depressive episodes: Secondary | ICD-10-CM

## 2014-04-02 DIAGNOSIS — G4733 Obstructive sleep apnea (adult) (pediatric): Secondary | ICD-10-CM

## 2014-04-02 LAB — URINALYSIS, ROUTINE W REFLEX MICROSCOPIC
Bilirubin Urine: NEGATIVE
Ketones, ur: NEGATIVE
LEUKOCYTES UA: NEGATIVE
Nitrite: NEGATIVE
Specific Gravity, Urine: 1.01 (ref 1.000–1.030)
Total Protein, Urine: NEGATIVE
UROBILINOGEN UA: 0.2 (ref 0.0–1.0)
Urine Glucose: NEGATIVE
pH: 7 (ref 5.0–8.0)

## 2014-04-02 LAB — HEPATIC FUNCTION PANEL
ALK PHOS: 113 U/L (ref 39–117)
ALT: 16 U/L (ref 0–35)
AST: 16 U/L (ref 0–37)
Albumin: 3.6 g/dL (ref 3.5–5.2)
BILIRUBIN DIRECT: 0 mg/dL (ref 0.0–0.3)
TOTAL PROTEIN: 7.8 g/dL (ref 6.0–8.3)
Total Bilirubin: 0.5 mg/dL (ref 0.2–1.2)

## 2014-04-02 LAB — BASIC METABOLIC PANEL
BUN: 11 mg/dL (ref 6–23)
CALCIUM: 8.8 mg/dL (ref 8.4–10.5)
CO2: 29 meq/L (ref 19–32)
CREATININE: 1.1 mg/dL (ref 0.4–1.2)
Chloride: 101 mEq/L (ref 96–112)
GFR: 71.94 mL/min (ref >60.00)
Glucose, Bld: 76 mg/dL (ref 70–99)
Potassium: 4 mEq/L (ref 3.5–5.1)
Sodium: 135 mEq/L (ref 135–145)

## 2014-04-02 LAB — LIPID PANEL
CHOL/HDL RATIO: 4
Cholesterol: 231 mg/dL — ABNORMAL HIGH (ref 0–200)
HDL: 58 mg/dL (ref >39.00)
LDL CALC: 141 mg/dL — AB (ref 0–99)
NonHDL: 173
Triglycerides: 160 mg/dL — ABNORMAL HIGH (ref 0.0–149.0)
VLDL: 32 mg/dL (ref 0.0–40.0)

## 2014-04-02 LAB — CBC WITH DIFFERENTIAL/PLATELET
Basophils Absolute: 0 10*3/uL (ref 0.0–0.1)
Basophils Relative: 0.4 % (ref 0.0–3.0)
Eosinophils Absolute: 0.1 10*3/uL (ref 0.0–0.7)
Eosinophils Relative: 1.7 % (ref 0.0–5.0)
HCT: 36.3 % (ref 36.0–46.0)
Hemoglobin: 11.7 g/dL — ABNORMAL LOW (ref 12.0–15.0)
LYMPHS PCT: 27.5 % (ref 12.0–46.0)
Lymphs Abs: 2.4 10*3/uL (ref 0.7–4.0)
MCHC: 32.2 g/dL (ref 30.0–36.0)
MCV: 75.9 fl — ABNORMAL LOW (ref 78.0–100.0)
Monocytes Absolute: 0.6 10*3/uL (ref 0.1–1.0)
Monocytes Relative: 7.2 % (ref 3.0–12.0)
NEUTROS PCT: 63.2 % (ref 43.0–77.0)
Neutro Abs: 5.6 10*3/uL (ref 1.4–7.7)
Platelets: 389 10*3/uL (ref 150.0–400.0)
RBC: 4.78 Mil/uL (ref 3.87–5.11)
RDW: 15.8 % — ABNORMAL HIGH (ref 11.5–15.5)
WBC: 8.9 10*3/uL (ref 4.0–10.5)

## 2014-04-02 LAB — TSH: TSH: 0.84 u[IU]/mL (ref 0.35–4.50)

## 2014-04-02 MED ORDER — ALPRAZOLAM 0.5 MG PO TABS: ORAL_TABLET | ORAL | Status: DC

## 2014-04-02 MED ORDER — PHENTERMINE-TOPIRAMATE ER 3.75-23 MG PO CP24: 1.0000 | ORAL_CAPSULE | Freq: Every day | ORAL | Status: DC

## 2014-04-02 MED ORDER — PHENTERMINE-TOPIRAMATE ER 7.5-46 MG PO CP24: 1.0000 | ORAL_CAPSULE | Freq: Every day | ORAL | Status: DC

## 2014-04-02 NOTE — Assessment & Plan Note (Signed)
Wt Readings from Last 3 Encounters:  04/02/14 302 lb 4 oz (137.1 kg)  01/26/14 301 lb (136.533 kg)  01/15/14 304 lb (137.893 kg)   Reviewed ongoing diet and exercise changes - encouragement support Interested in med assistance - discussed options and pt elects Qysmia we reviewed potential risk/benefit and possible side effects - pt understands and agrees to same  Also encouraged to pursue consideration of bariatric intervention as planned

## 2014-04-02 NOTE — Assessment & Plan Note (Signed)
Overlap of chronic symptoms with anxiety and panic attacks Previously on sertraline (05/2011-2014), for now when necessary Xanax only Reviewed situational stressors exacerbating same If persistent or increasing symptoms with normal labs and tolerance of Qysmia, we'll consider resuming SSRI or SSRI therapy Patient understands and agrees to same

## 2014-04-02 NOTE — Assessment & Plan Note (Signed)
Mild OSA as dx sleep study summer 2014, not currently on tx and working on weight reduction (see MO above)

## 2014-04-02 NOTE — Progress Notes (Signed)
Subjective:    Patient ID: Alicia Chen, female    DOB: 02-Jul-1966, 48 y.o.   MRN: [id number]  HPI  patient is here today for annual physical. Patient feels well and has no complaints.  Also reviewed chronic medical issues and interval medical events  Past Medical History  Diagnosis Date  . ARTHRITIS   . MIGRAINE HEADACHE   . OBESITY, MORBID   . ALLERGIC RHINITIS   . ASTHMA   . GERD   . DEPRESSION   . ANEMIA-NOS    Family History  Problem Relation Age of Onset  . Arthritis Mother   . Arthritis Father   . Alcohol abuse Other     Parents  . Arthritis Other     grandparents  . Diabetes Other     Parent & grandparent  . Hyperlipidemia Other     Parent, grandparent, other relative  . Hypertension Other     parent, grandparent, other relative  . Prostate cancer Other     grandfather  . Breast cancer Sister    History  Substance Use Topics  . Smoking status: Never Smoker   . 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 Alcohol Use: Yes     Comment: Social    Review of Systems  Constitutional: Negative for fatigue and unexpected weight change.  Respiratory: Negative for cough, shortness of breath and wheezing.   Cardiovascular: Negative for chest pain, palpitations and leg swelling.  Gastrointestinal: Negative for nausea, abdominal pain and diarrhea.  Neurological: Negative for dizziness, weakness, light-headedness and headaches.  Psychiatric/Behavioral: Negative for suicidal ideas, self-injury, dysphoric mood and decreased concentration. The patient is nervous/anxious.   All other systems reviewed and are negative.      Objective:   Physical Exam  BP 118/80  Pulse 79  Temp(Src) 98.2 F (36.8 C) (Oral)  Ht 5\' 3"  (1.6 m)  Wt 302 lb 4 oz (137.1 kg)  BMI 53.55 kg/m2  SpO2 97% Wt Readings from Last 3 Encounters:  04/02/14 302 lb 4 oz (137.1 kg)  01/26/14 301 lb (136.533 kg)    01/15/14 304 lb (137.893 kg)   Constitutional: She is obese, appears well-developed and well-nourished. No distress.  HENT: Head: Normocephalic and atraumatic. Ears: B TMs ok, no erythema or effusion; Nose: Nose normal. Mouth/Throat: Oropharynx is clear and moist. No oropharyngeal exudate.  Eyes: Conjunctivae and EOM are normal. Pupils are equal, round, and reactive to light. No scleral icterus.  Neck: Normal range of motion. Neck supple. No JVD present. No thyromegaly present.  Cardiovascular: Normal rate, regular rhythm and normal heart sounds.  No murmur heard. No BLE edema. Pulmonary/Chest: Effort normal and breath sounds normal. No respiratory distress. She has no wheezes.  Abdominal: Soft. Bowel sounds are normal. She exhibits no distension. There is no tenderness. no masses GU/breast: defer to gyn Musculoskeletal: Normal range of motion, no joint effusions. No gross deformities Neurological: She is alert and oriented to person, place, and time. No cranial nerve deficit. Coordination, balance, strength, speech and gait are normal.  Skin: Skin is warm and dry. No rash noted. No erythema.  Psychiatric: She has a normal mood and affect. Her behavior is normal. Judgment and thought content normal.    Lab Results  Component Value Date   WBC 7.0 01/30/2013   HGB 12.5 01/30/2013   HCT 38.0 01/30/2013   PLT 353.0 01/30/2013   GLUCOSE 83 01/30/2013   CHOL  222* 01/30/2013   TRIG 164.0* 01/30/2013   HDL 52.80 01/30/2013   LDLDIRECT 142.0 01/30/2013   LDLCALC 126* 09/22/2010   ALT 13 01/30/2013   AST 12 01/30/2013   NA 138 01/30/2013   K 4.2 01/30/2013   CL 104 01/30/2013   CREATININE 0.9 01/30/2013   BUN 10 01/30/2013   CO2 27 01/30/2013   TSH 1.23 01/30/2013    Mm Screening Breast Tomo Bilateral  12/10/2013   CLINICAL DATA:  Screening.  EXAM: DIGITAL SCREENING BILATERAL MAMMOGRAM WITH 3D TOMO WITH CAD  COMPARISON:  Previous exam(s).  ACR Breast Density Category b: There are scattered areas of  fibroglandular density.  FINDINGS: There are no findings suspicious for malignancy. Images were processed with CAD.  IMPRESSION: No mammographic evidence of malignancy. A result letter of this screening mammogram will be mailed directly to the patient.  RECOMMENDATION: Screening mammogram in one year. (Code:SM-B-01Y)  BI-RADS CATEGORY  1: Negative.   Electronically Signed   By: Hassan Rowan M.D.   On: 12/10/2013 14:13       Assessment & Plan:   CPX/v70.0 - Patient has been counseled on age-appropriate routine health concerns for screening and prevention. These are reviewed and up-to-date. Immunizations are up-to-date or declined. Labs ordered and reviewed.  Problem List Items Addressed This Visit   DEPRESSION     Overlap of chronic symptoms with anxiety and panic attacks Previously on sertraline (05/2011-2014), for now when necessary Xanax only Reviewed situational stressors exacerbating same If persistent or increasing symptoms with normal labs and tolerance of Qysmia, we'll consider resuming SSRI or SSRI therapy Patient understands and agrees to same    Relevant Medications      ALPRAZolam (XANAX) tablet   OBESITY, MORBID      Wt Readings from Last 3 Encounters:  04/02/14 302 lb 4 oz (137.1 kg)  01/26/14 301 lb (136.533 kg)  01/15/14 304 lb (137.893 kg)   Reviewed ongoing diet and exercise changes - encouragement support Interested in med assistance - discussed options and pt elects Qysmia we reviewed potential risk/benefit and possible side effects - pt understands and agrees to same  Also encouraged to pursue consideration of bariatric intervention as planned    Relevant Medications      Phentermine-Topiramate (QSYMIA) 3.75-23 MG CP24      Phentermine-Topiramate 7.5-46 MG CP24 (Start on 04/17/2014)   OSA (obstructive sleep apnea)     Mild OSA as dx sleep study summer 2014, not currently on tx and working on weight reduction (see MO above)     Other Visit Diagnoses   Routine  general medical examination at a health care facility    -  Primary    Relevant Orders       Basic metabolic panel       CBC with Differential       Hepatic function panel       Lipid panel       TSH       Urinalysis, Routine w reflex microscopic

## 2014-04-02 NOTE — Progress Notes (Signed)
Pre visit review using our clinic review tool, if applicable. No additional management support is needed unless otherwise documented below in the visit note. 

## 2014-04-02 NOTE — Patient Instructions (Addendum)
It was good to see you today.  We have reviewed your prior records including labs and tests today  Health Maintenance reviewed - remember to get your annual flu shot this fall - all other recommended immunizations and age-appropriate screenings are up-to-date.  Test(s) ordered today. Your results will be released to [facility] (or called to you) after review, usually within 72hours after test completion. If any changes need to be made, you will be notified at that same time.  Medications reviewed and updated If labs are normal, okay to begin Qysmia as discussed for weight loss - prescription for same given to you today No other changes recommended at this time.  Please schedule followup in 2-3 months for weight in and medication review, call sooner if problems.  Health Maintenance Adopting a healthy lifestyle and getting preventive care can go a long way to promote health and wellness. Talk with your health care provider about what schedule of regular examinations is right for you. This is a good chance for you to check in with your provider about disease prevention and staying healthy. In between checkups, there are plenty of things you can do on your own. Experts have done a lot of research about which lifestyle changes and preventive measures are most likely to keep you healthy. Ask your health care provider for more information. WEIGHT AND DIET  Eat a healthy diet  Be sure to include plenty of vegetables, fruits, low-fat dairy products, and lean protein.  Do not eat a lot of foods high in solid fats, added sugars, or salt.  Get regular exercise. This is one of the most important things you can do for your health.  Most adults should exercise for at least 150 minutes each week. The exercise should increase your heart rate and make you sweat (moderate-intensity exercise).  Most adults should also do strengthening exercises at least twice a week. This is in addition to the  moderate-intensity exercise.  Maintain a healthy weight  Body mass index (BMI) is a measurement that can be used to identify possible weight problems. It estimates body fat based on height and weight. Your health care provider can help determine your BMI and help you achieve or maintain a healthy weight.  For females 61 years of age and older:   A BMI below 18.5 is considered underweight.  A BMI of 18.5 to 24.9 is normal.  A BMI of 25 to 29.9 is considered overweight.  A BMI of 30 and above is considered obese.  Watch levels of cholesterol and blood lipids  You should start having your blood tested for lipids and cholesterol at 48 years of age, then have this test every 5 years.  You may need to have your cholesterol levels checked more often if:  Your lipid or cholesterol levels are high.  You are older than 48 years of age.  You are at high risk for heart disease.  CANCER SCREENING   Lung Cancer  Lung cancer screening is recommended for adults 50-2 years old who are at high risk for lung cancer because of a history of smoking.  A yearly low-dose CT scan of the lungs is recommended for people who:  Currently smoke.  Have quit within the past 15 years.  Have at least a 30-pack-year history of smoking. A pack year is smoking an average of one pack of cigarettes a day for 1 year.  Yearly screening should continue until it has been 15 years since you quit.  Yearly  screening should stop if you develop a health problem that would prevent you from having lung cancer treatment.  Breast Cancer  Practice breast self-awareness. This means understanding how your breasts normally appear and feel.  It also means doing regular breast self-exams. Let your health care provider know about any changes, no matter how small.  If you are in your 20s or 30s, you should have a clinical breast exam (CBE) by a health care provider every 1-3 years as part of a regular health exam.  If  you are 40 or older, have a CBE every year. Also consider having a breast X-ray (mammogram) every year.  If you have a family history of breast cancer, talk to your health care provider about genetic screening.  If you are at high risk for breast cancer, talk to your health care provider about having an MRI and a mammogram every year.  Breast cancer gene (BRCA) assessment is recommended for women who have family members with BRCA-related cancers. BRCA-related cancers include:  Breast.  Ovarian.  Tubal.  Peritoneal cancers.  Results of the assessment will determine the need for genetic counseling and BRCA1 and BRCA2 testing. Cervical Cancer Routine pelvic examinations to screen for cervical cancer are no longer recommended for nonpregnant women who are considered low risk for cancer of the pelvic organs (ovaries, uterus, and vagina) and who do not have symptoms. A pelvic examination may be necessary if you have symptoms including those associated with pelvic infections. Ask your health care provider if a screening pelvic exam is right for you.   The Pap test is the screening test for cervical cancer for women who are considered at risk.  If you had a hysterectomy for a problem that was not cancer or a condition that could lead to cancer, then you no longer need Pap tests.  If you are older than 65 years, and you have had normal Pap tests for the past 10 years, you no longer need to have Pap tests.  If you have had past treatment for cervical cancer or a condition that could lead to cancer, you need Pap tests and screening for cancer for at least 20 years after your treatment.  If you no longer get a Pap test, assess your risk factors if they change (such as having a new sexual partner). This can affect whether you should start being screened again.  Some women have medical problems that increase their chance of getting cervical cancer. If this is the case for you, your health care  provider may recommend more frequent screening and Pap tests.  The human papillomavirus (HPV) test is another test that may be used for cervical cancer screening. The HPV test looks for the virus that can cause cell changes in the cervix. The cells collected during the Pap test can be tested for HPV.  The HPV test can be used to screen women 30 years of age and older. Getting tested for HPV can extend the interval between normal Pap tests from three to five years.  An HPV test also should be used to screen women of any age who have unclear Pap test results.  After 48 years of age, women should have HPV testing as often as Pap tests.  Colorectal Cancer  This type of cancer can be detected and often prevented.  Routine colorectal cancer screening usually begins at 48 years of age and continues through 48 years of age.  Your health care provider may recommend screening at an   earlier age if you have risk factors for colon cancer.  Your health care provider may also recommend using home test kits to check for hidden blood in the stool.  A small camera at the end of a tube can be used to examine your colon directly (sigmoidoscopy or colonoscopy). This is done to check for the earliest forms of colorectal cancer.  Routine screening usually begins at age 50.  Direct examination of the colon should be repeated every 5-10 years through 48 years of age. However, you may need to be screened more often if early forms of precancerous polyps or small growths are found. Skin Cancer  Check your skin from head to toe regularly.  Tell your health care provider about any new moles or changes in moles, especially if there is a change in a mole's shape or color.  Also tell your health care provider if you have a mole that is larger than the size of a pencil eraser.  Always use sunscreen. Apply sunscreen liberally and repeatedly throughout the day.  Protect yourself by wearing long sleeves, pants, a  wide-brimmed hat, and sunglasses whenever you are outside. HEART DISEASE, DIABETES, AND HIGH BLOOD PRESSURE   Have your blood pressure checked at least every 1-2 years. High blood pressure causes heart disease and increases the risk of stroke.  If you are between 55 years and 79 years old, ask your health care provider if you should take aspirin to prevent strokes.  Have regular diabetes screenings. This involves taking a blood sample to check your fasting blood sugar level.  If you are at a normal weight and have a low risk for diabetes, have this test once every three years after 48 years of age.  If you are overweight and have a high risk for diabetes, consider being tested at a younger age or more often. PREVENTING INFECTION  Hepatitis B  If you have a higher risk for hepatitis B, you should be screened for this virus. You are considered at high risk for hepatitis B if:  You were born in a country where hepatitis B is common. Ask your health care provider which countries are considered high risk.  Your parents were born in a high-risk country, and you have not been immunized against hepatitis B (hepatitis B vaccine).  You have HIV or AIDS.  You use needles to inject street drugs.  You live with someone who has hepatitis B.  You have had sex with someone who has hepatitis B.  You get hemodialysis treatment.  You take certain medicines for conditions, including cancer, organ transplantation, and autoimmune conditions. Hepatitis C  Blood testing is recommended for:  Everyone born from 1945 through 1965.  Anyone with known risk factors for hepatitis C. Sexually transmitted infections (STIs)  You should be screened for sexually transmitted infections (STIs) including gonorrhea and chlamydia if:  You are sexually active and are younger than 48 years of age.  You are older than 48 years of age and your health care provider tells you that you are at risk for this type of  infection.  Your sexual activity has changed since you were last screened and you are at an increased risk for chlamydia or gonorrhea. Ask your health care provider if you are at risk.  If you do not have HIV, but are at risk, it may be recommended that you take a prescription medicine daily to prevent HIV infection. This is called pre-exposure prophylaxis (PrEP). You are considered at risk   if:  You are sexually active and do not regularly use condoms or know the HIV status of your partner(s).  You take drugs by injection.  You are sexually active with a partner who has HIV. Talk with your health care provider about whether you are at high risk of being infected with HIV. If you choose to begin PrEP, you should first be tested for HIV. You should then be tested every 3 months for as long as you are taking PrEP.  PREGNANCY   If you are premenopausal and you may become pregnant, ask your health care provider about preconception counseling.  If you may become pregnant, take 400 to 800 micrograms (mcg) of folic acid every day.  If you want to prevent pregnancy, talk to your health care provider about birth control (contraception). OSTEOPOROSIS AND MENOPAUSE   Osteoporosis is a disease in which the bones lose minerals and strength with aging. This can result in serious bone fractures. Your risk for osteoporosis can be identified using a bone density scan.  If you are 33 years of age or older, or if you are at risk for osteoporosis and fractures, ask your health care provider if you should be screened.  Ask your health care provider whether you should take a calcium or vitamin D supplement to lower your risk for osteoporosis.  Menopause may have certain physical symptoms and risks.  Hormone replacement therapy may reduce some of these symptoms and risks. Talk to your health care provider about whether hormone replacement therapy is right for you.  HOME CARE INSTRUCTIONS   Schedule regular  health, dental, and eye exams.  Stay current with your immunizations.   Do not use any tobacco products including cigarettes, chewing tobacco, or electronic cigarettes.  If you are pregnant, do not drink alcohol.  If you are breastfeeding, limit how much and how often you drink alcohol.  Limit alcohol intake to no more than 1 drink per day for nonpregnant women. One drink equals 12 ounces of beer, 5 ounces of wine, or 1 ounces of hard liquor.  Do not use street drugs.  Do not share needles.  Ask your health care provider for help if you need support or information about quitting drugs.  Tell your health care provider if you often feel depressed.  Tell your health care provider if you have ever been abused or do not feel safe at home. Document Released: 02/06/2011 Document Revised: 12/08/2013 Document Reviewed: 06/25/2013 [facility] Patient Information 2015 [place], [place]. This information is not intended to replace advice given to you by your health care provider. Make sure you discuss any questions you have with your health care provider. Exercise to Lose Weight Exercise and a healthy diet may help you lose weight. Your doctor may suggest specific exercises. EXERCISE IDEAS AND TIPS  Choose low-cost things you enjoy doing, such as walking, bicycling, or exercising to workout videos.  Take stairs instead of the elevator.  Walk during your lunch break.  Park your car further away from work or school.  Go to a gym or an exercise class.  Start with 5 to 10 minutes of exercise each day. Build up to 30 minutes of exercise 4 to 6 days a week.  Wear shoes with good support and comfortable clothes.  Stretch before and after working out.  Work out until you breathe harder and your heart beats faster.  Drink extra water when you exercise.  Do not do so much that you hurt yourself, feel dizzy,  or get very short of breath. Exercises that burn about 150 calories:  Running 1   miles in 15 minutes.  Playing volleyball for 45 to 60 minutes.  Washing and waxing a car for 45 to 60 minutes.  Playing touch football for 45 minutes.  Walking 1  miles in 35 minutes.  Pushing a stroller 1  miles in 30 minutes.  Playing basketball for 30 minutes.  Raking leaves for 30 minutes.  Bicycling 5 miles in 30 minutes.  Walking 2 miles in 30 minutes.  Dancing for 30 minutes.  Shoveling snow for 15 minutes.  Swimming laps for 20 minutes.  Walking up stairs for 15 minutes.  Bicycling 4 miles in 15 minutes.  Gardening for 30 to 45 minutes.  Jumping rope for 15 minutes.  Washing windows or floors for 45 to 60 minutes. Document Released: 08/26/2010 Document Revised: 10/16/2011 Document Reviewed: 08/26/2010 [facility] Patient Information 2015 [place], [place]. This information is not intended to replace advice given to you by your health care provider. Make sure you discuss any questions you have with your health care provider.

## 2014-04-09 ENCOUNTER — Ambulatory Visit: Payer: 59

## 2014-05-04 ENCOUNTER — Ambulatory Visit: Payer: 59 | Admitting: Obstetrics & Gynecology

## 2014-06-08 ENCOUNTER — Encounter: Payer: Self-pay | Admitting: Internal Medicine

## 2014-06-29 ENCOUNTER — Telehealth: Payer: Self-pay | Admitting: Internal Medicine

## 2014-06-29 ENCOUNTER — Encounter: Payer: Self-pay | Admitting: Internal Medicine

## 2014-06-29 ENCOUNTER — Ambulatory Visit (INDEPENDENT_AMBULATORY_CARE_PROVIDER_SITE_OTHER): Payer: 59 | Admitting: Internal Medicine

## 2014-06-29 VITALS — BP 124/90 | HR 102 | Temp 98.3°F | Wt 303.5 lb

## 2014-06-29 DIAGNOSIS — R05 Cough: Secondary | ICD-10-CM

## 2014-06-29 DIAGNOSIS — R059 Cough, unspecified: Secondary | ICD-10-CM

## 2014-06-29 DIAGNOSIS — J310 Chronic rhinitis: Secondary | ICD-10-CM

## 2014-06-29 DIAGNOSIS — K219 Gastro-esophageal reflux disease without esophagitis: Secondary | ICD-10-CM

## 2014-06-29 MED ORDER — AZITHROMYCIN 250 MG PO TABS: ORAL_TABLET | ORAL | Status: DC

## 2014-06-29 NOTE — Telephone Encounter (Signed)
Patient Information:  Caller Name: Heli  Phone: [phone]  Patient: Alicia Chen, Alicia Chen  Gender: Female  DOB: [birth year]  Age: 48 Years  PCP: Gwendolyn Grant (Adults only)  Pregnant: No  Office Follow Up:  Does the office need to follow up with this patient?: No  Instructions For The Office: N/A   Symptoms  Reason For Call & Symptoms: Episode of sudden tightness in chest about 11 pm last night, felt SOB then noting lot of phlegm in throat that caused gagging then worse SOB. Had difficulty swallowing and felt some panic, was afraid to go to sleep so sat up all night.  Took Xanax which helped calm some.  This am 11/23 feeling better but still mild pressure feeling in chest and has had to pause 3-4 times during conversation to take a deep breath but speech without difficulty.  Unsure about fever but feeling cold at times. Has appointment at 11:45 am today.  Reviewed Health History In EMR: Yes  Reviewed Medications In EMR: Yes  Reviewed Allergies In EMR: Yes  Reviewed Surgeries / Procedures: Yes  Date of Onset of Symptoms: 06/28/2014  Treatments Tried: Xanax helped some  Treatments Tried Worked: Yes OB / GYN:  LMP: 04/22/2014  Guideline(s) Used:  Chest Pain  Disposition Per Guideline:   Go to ED Now  Reason For Disposition Reached:   Difficulty breathing  Advice Given:  N/A  Patient Refused Recommendation:  Patient Will Follow Up With Office Later  Made appointment in office for this am

## 2014-06-29 NOTE — Patient Instructions (Addendum)
Plain Mucinex (NOT D) for thick secretions ;force NON dairy fluids .   Nasal cleansing in the shower as discussed with lather of mild shampoo.After 10 seconds wash off lather while  exhaling through nostrils. Make sure that all residual soap is removed to prevent irritation.  Flonase OR Nasacort AQ 1 spray in each nostril twice a day as needed. Use the "crossover" technique into opposite nostril spraying toward opposite ear @ 45 degree angle, not straight up into nostril.  Use a Neti pot daily only  as needed for significant sinus congestion; going from open side to congested side . Plain Allegra (NOT D )  160 daily , Loratidine 10 mg , OR Zyrtec 10 mg @ bedtime  as needed for itchy eyes & sneezing.  Fill the  prescription for Zithromax it there is not dramatic improvement in the next 48 hours.   Reflux of gastric acid may be asymptomatic as this may occur mainly during sleep.The triggers for reflux  include stress; the "aspirin family" ; alcohol; peppermint; and caffeine (coffee, tea, cola, and chocolate). The aspirin family would include aspirin and the nonsteroidal agents such as ibuprofen &  Naproxen. Tylenol would not cause reflux. If having symptoms ; food & drink should be avoided for @ least 2 hours before going to bed.

## 2014-06-29 NOTE — Progress Notes (Signed)
   Subjective:    Patient ID: Alicia Chen, female    DOB: January 28, 1966, 48 y.o.   MRN: [id number]  HPI   Her symptoms began 06/27/14 as frontal headache with extension of pain into the right ear. She also had associated head congestion and chest congestion with a nonproductive cough.  She was not sure whether she was having migraines or sinus problems and used Excedrin Migraine with only partial response  All secretions from the head and chest been clear but thick  She has constant clearing from her throat.  The postnasal drainage is associated with sore throat.  She describes some wheezing , shortness of breath and chest tightness  She's also had some sneezing.  She has a history of asthma and seasonal  allergies    Review of Systems Facial pain , nasal purulence, dental pain,  or otic discharge denied. No fever or sweats.  Despite [place] she continues to have reflux symptoms.     Objective:   Physical Exam  General appearance:good health ;well nourished; no acute distress or increased work of breathing is present.  No  lymphadenopathy about the head, neck, or axilla noted.   Eyes: No conjunctival inflammation or lid edema is present. There is no scleral icterus.  Ears:  External ear exam shows no significant lesions or deformities.  Otoscopic examination reveals clear canals, tympanic membranes are intact bilaterally without bulging, retraction, inflammation or discharge.  Nose:  External nasal examination shows no deformity or inflammation. Nasal mucosa are pink and moist without lesions or exudates. No septal dislocation or deviation.No obstruction to airflow.   Oral exam: Dental hygiene is good; lips and gums are healthy appearing.There is no oropharyngeal erythema or exudate noted.   Neck:  No deformities, thyromegaly, masses, or tenderness noted.   Supple with full range of motion without pain.   Heart:  Normal rate and regular rhythm. Repeat pulse 90.S1 and  S2 normal without gallop, murmur, click, rub or other extra sounds.   Lungs:Chest clear to auscultation; no wheezes, rhonchi,rales ,or rubs present.No increased work of breathing.    Extremities:  No cyanosis, edema, or clubbing  noted    Skin: Warm & dry w/o jaundice or tenting.         Assessment & Plan:  #1 non allergic rhinitis #2 cough #3 GERD See Orders & AVS

## 2014-06-29 NOTE — Progress Notes (Signed)
Pre visit review using our clinic review tool, if applicable. No additional management support is needed unless otherwise documented below in the visit note. 

## 2014-07-06 ENCOUNTER — Ambulatory Visit (INDEPENDENT_AMBULATORY_CARE_PROVIDER_SITE_OTHER): Payer: 59 | Admitting: Internal Medicine

## 2014-07-06 ENCOUNTER — Encounter: Payer: Self-pay | Admitting: Internal Medicine

## 2014-07-06 VITALS — BP 124/78 | HR 117 | Temp 98.8°F | Ht 63.0 in | Wt 296.5 lb

## 2014-07-06 DIAGNOSIS — G4733 Obstructive sleep apnea (adult) (pediatric): Secondary | ICD-10-CM

## 2014-07-06 MED ORDER — PHENTERMINE-TOPIRAMATE ER 3.75-23 MG PO CP24: 1.0000 | ORAL_CAPSULE | Freq: Every day | ORAL | Status: AC

## 2014-07-06 MED ORDER — PHENTERMINE-TOPIRAMATE ER 7.5-46 MG PO CP24: 1.0000 | ORAL_CAPSULE | Freq: Every day | ORAL | Status: DC

## 2014-07-06 MED ORDER — BENZONATATE 100 MG PO CAPS: 100.0000 mg | ORAL_CAPSULE | Freq: Two times a day (BID) | ORAL | Status: DC | PRN

## 2014-07-06 NOTE — Progress Notes (Signed)
Subjective:    Patient ID: Alicia Chen, female    DOB: 28-Mar-1966, 48 y.o.   MRN: [id number]  HPI  Patient is here for follow up  Reviewed chronic medical issues and interval medical events  Past Medical History  Diagnosis Date  . ARTHRITIS   . MIGRAINE HEADACHE   . OBESITY, MORBID   . ALLERGIC RHINITIS   . ASTHMA   . GERD   . DEPRESSION   . ANEMIA-NOS     Review of Systems  Constitutional: Positive for chills and fatigue. Negative for fever.  HENT: Positive for rhinorrhea, sinus pressure and sneezing. Negative for sore throat and trouble swallowing.   Respiratory: Positive for cough (in AM worse than other times). Negative for shortness of breath.   Cardiovascular: Negative for chest pain and leg swelling.       Objective:   Physical Exam  BP 124/78 mmHg  Pulse 117  Temp(Src) 98.8 F (37.1 C) (Oral)  Ht 5\' 3"  (1.6 m)  Wt 296 lb 8 oz (134.492 kg)  BMI 52.54 kg/m2  SpO2 98% Wt Readings from Last 3 Encounters:  07/06/14 296 lb 8 oz (134.492 kg)  06/29/14 303 lb 8 oz (137.667 kg)  04/02/14 302 lb 4 oz (137.1 kg)   Constitutional: She is MO, and appears well-developed and well-nourished. Fatigued and no acute distress.  HENT: clear rhinorrhea, mild OP erythema without exudate - no sinus tenderness to palpation - B TMs clear without erythema or cerumen impaction Neck: Normal range of motion. Neck supple. No JVD present. No thyromegaly present.  Cardiovascular: slightly tachy rate, regular rhythm and normal heart sounds.  No murmur heard. No BLE edema. Pulmonary/Chest: Effort normal and breath sounds normal. No respiratory distress. She has no wheezes.  Psychiatric: She has a normal mood and affect. Her behavior is normal. Judgment and thought content normal.   Lab Results  Component Value Date   WBC 8.9 04/02/2014   HGB 11.7* 04/02/2014   HCT 36.3 04/02/2014   PLT 389.0 04/02/2014   GLUCOSE 76 04/02/2014   CHOL 231* 04/02/2014   TRIG 160.0* 04/02/2014   HDL  58.00 04/02/2014   LDLDIRECT 142.0 01/30/2013   LDLCALC 141* 04/02/2014   ALT 16 04/02/2014   AST 16 04/02/2014   NA 135 04/02/2014   K 4.0 04/02/2014   CL 101 04/02/2014   CREATININE 1.1 04/02/2014   BUN 11 04/02/2014   CO2 29 04/02/2014   TSH 0.84 04/02/2014    Mm Screening Breast Tomo Bilateral  12/10/2013   CLINICAL DATA:  Screening.  EXAM: DIGITAL SCREENING BILATERAL MAMMOGRAM WITH 3D TOMO WITH CAD  COMPARISON:  Previous exam(s).  ACR Breast Density Category b: There are scattered areas of fibroglandular density.  FINDINGS: There are no findings suspicious for malignancy. Images were processed with CAD.  IMPRESSION: No mammographic evidence of malignancy. A result letter of this screening mammogram will be mailed directly to the patient.  RECOMMENDATION: Screening mammogram in one year. (Code:[id number]-01Y)  BI-RADS CATEGORY  1: Negative.   Electronically Signed   By: Hassan Rowan M.D.   On: 12/10/2013 14:13       Assessment & Plan:   URI - symptomatic care advised (completing Zpak as rx'd last week) - new tessalon in addition to ongoing tx for allergic rhinitis   Problem List Items Addressed This Visit    OBESITY, MORBID    Wt Readings from Last 3 Encounters:  07/06/14 296 lb 8 oz (134.492 kg)  06/29/14 303  lb 8 oz (137.667 kg)  04/02/14 302 lb 4 oz (137.1 kg)   Reviewed ongoing diet and exercise changes - encouragement support Interested in med assistance - discussed options and pt elects Qysmia (prev rx'd 03/2014 and never filled by pharmacy - will re-rx at pt request now) we reviewed potential risk/benefit and possible side effects - pt understands and agrees to same  Also encouraged to pursue consideration of bariatric intervention as prev planned    Relevant Medications      Phentermine-Topiramate (QSYMIA) 3.75-23 MG CP24      Phentermine-Topiramate 7.5-46 MG CP24   OSA (obstructive sleep apnea) - Primary    Mild OSA as dx sleep study summer 2014, not currently on  tx Chronically working on weight reduction (see [place]) - pending significant reduction in weight, refer back to sleep medicine for initiation of CPAP or other therapy as needed      Relevant Orders      Ambulatory referral to Pulmonology

## 2014-07-06 NOTE — Assessment & Plan Note (Signed)
Wt Readings from Last 3 Encounters:  07/06/14 296 lb 8 oz (134.492 kg)  06/29/14 303 lb 8 oz (137.667 kg)  04/02/14 302 lb 4 oz (137.1 kg)   Reviewed ongoing diet and exercise changes - encouragement support Interested in med assistance - discussed options and pt elects Qysmia (prev rx'd 03/2014 and never filled by pharmacy - will re-rx at pt request now) we reviewed potential risk/benefit and possible side effects - pt understands and agrees to same  Also encouraged to pursue consideration of bariatric intervention as prev planned

## 2014-07-06 NOTE — Assessment & Plan Note (Signed)
Mild OSA as dx sleep study summer 2014, not currently on tx Chronically working on weight reduction (see MO) - pending significant reduction in weight, refer back to sleep medicine for initiation of CPAP or other therapy as needed

## 2014-07-06 NOTE — Patient Instructions (Signed)
It was good to see you today.  We have reviewed your prior records including labs and tests today  Medications reviewed and updated Tessalon as needed for cough and Tylenol sinus as needed in addition to daily loratadine and Flonase Qysmia for weight reduction assistance -  No other changes recommended at this time. Your prescription(s) have been submitted to your pharmacy (or given to you). Please take as directed and contact our office if you believe you are having problem(s) with the medication(s).  Continue to work on lifestyle changes as discussed (low fat, low carb, increased protein diet; improved exercise efforts; weight loss) to control sugar, blood pressure and cholesterol levels and/or reduce risk of developing other medical problems. Look into http://vang.com/ or other type of food journal to assist you in this process.  we'll make referral to Dr. Gwenette Greet for follow up on obstructive sleep apnea and ?need for CPAP . Our office will contact you regarding appointment(s) once made.  Work excuse note given to you for today  Please schedule followup in 6 months, call sooner if problems.

## 2014-07-06 NOTE — Progress Notes (Signed)
Pre visit review using our clinic review tool, if applicable. No additional management support is needed unless otherwise documented below in the visit note. 

## 2014-07-22 ENCOUNTER — Encounter: Payer: Self-pay | Admitting: Pulmonary Disease

## 2014-07-22 ENCOUNTER — Ambulatory Visit (INDEPENDENT_AMBULATORY_CARE_PROVIDER_SITE_OTHER): Payer: 59 | Admitting: Pulmonary Disease

## 2014-07-22 VITALS — BP 126/72 | HR 91 | Temp 97.8°F | Ht 63.0 in | Wt 306.8 lb

## 2014-07-22 DIAGNOSIS — G4733 Obstructive sleep apnea (adult) (pediatric): Secondary | ICD-10-CM

## 2014-07-22 NOTE — Patient Instructions (Signed)
Will start on cpap at a moderate pressure level.  Please call if you are having tolerance issues.  Work on weight loss followup with me again in 8 weeks.

## 2014-07-22 NOTE — Assessment & Plan Note (Signed)
The patient has a history of mild obstructive sleep apnea, but has gained further weight since her last visit. She is seeing a greater impact to her sleep and also her quality of life during the day. She would like to treat her sleep-disordered breathing more aggressively, and I discussed with her the options of C Pap and dental appliance. I suspect that her sleep disordered breathing has worsened since her last study, and therefore would lean toward starting with C Pap. The patient is agreeable to this approach. I will set the patient up on cpap at a moderate pressure level to allow for desensitization, and will troubleshoot the device over the next 4-6weeks if needed.  The pt is to call me if having issues with tolerance.  Will then optimize the pressure once patient is able to wear cpap on a consistent basis.

## 2014-07-22 NOTE — Progress Notes (Signed)
   Subjective:    Patient ID: Alicia Chen, female    DOB: 1966/06/21, 48 y.o.   MRN: [id number]  HPI The patient comes in today for follow-up of her obstructive sleep apnea. She was last seen a year ago, where she was found to have mild OSA. She had decided to treat this conservatively with a trial of weight loss, and I've asked her to call me in 6 months if she did not make progress. She comes in today where her weight has actually increased, and she is having more sleep disruption with daytime issues.   Review of Systems  Constitutional: Negative for fever and unexpected weight change.  HENT: Positive for congestion. Negative for dental problem, ear pain, nosebleeds, postnasal drip, rhinorrhea, sinus pressure, sneezing, sore throat and trouble swallowing.   Eyes: Negative for redness and itching.  Respiratory: Positive for cough. Negative for chest tightness, shortness of breath and wheezing.   Cardiovascular: Negative for palpitations and leg swelling.  Gastrointestinal: Negative for nausea and vomiting.  Genitourinary: Negative for dysuria.  Musculoskeletal: Negative for joint swelling.  Skin: Negative for rash.  Neurological: Negative for headaches.  Hematological: Does not bruise/bleed easily.  Psychiatric/Behavioral: Negative for dysphoric mood. The patient is not nervous/anxious.        Objective:   Physical Exam Obese female in no acute distress Nose without purulence or discharge noted Neck without lymphadenopathy or thyromegaly Lower extremities with edema noted, no cyanosis Alert and oriented, moves all 4 extremities.       Assessment & Plan:

## 2014-08-03 ENCOUNTER — Encounter: Payer: Self-pay | Admitting: *Deleted

## 2014-08-04 ENCOUNTER — Encounter: Payer: Self-pay | Admitting: Obstetrics & Gynecology

## 2014-09-16 ENCOUNTER — Ambulatory Visit: Payer: 59 | Admitting: Pulmonary Disease

## 2014-09-16 ENCOUNTER — Ambulatory Visit (INDEPENDENT_AMBULATORY_CARE_PROVIDER_SITE_OTHER): Payer: 59 | Admitting: Pulmonary Disease

## 2014-09-16 ENCOUNTER — Encounter: Payer: Self-pay | Admitting: Pulmonary Disease

## 2014-09-16 VITALS — BP 122/74 | HR 82 | Temp 97.6°F | Ht 63.0 in | Wt 303.0 lb

## 2014-09-16 DIAGNOSIS — G4733 Obstructive sleep apnea (adult) (pediatric): Secondary | ICD-10-CM

## 2014-09-16 NOTE — Assessment & Plan Note (Signed)
The patient has been trying to wear C Pap for her obstructive sleep apnea, but is having a lot of issues with upper airway dryness. This has led to noncompliance, but the download for the days that she has worn the device shows excellent control of her AHI. I have asked her to increase the heat on her humidifier, but if she continues to have issues, we may have to consider changing to a dental appliance for treatment. I have also encouraged her to work aggressively on weight loss.

## 2014-09-16 NOTE — Patient Instructions (Signed)
Turn up the heat on your humidifier to get more moisture to your nose, sinuses, and upper airway Keep working with cpap the next few weeks, but if continuing to have tolerance issues let us know.  We could then consider a dental appliance Work on weight loss followup with me in 59mos if doing well, but call if not.

## 2014-09-16 NOTE — Progress Notes (Signed)
   Subjective:    Patient ID: Alicia Chen, female    DOB: [birth year], 49 y.o.   MRN: [id number]  HPI The patient comes in today for follow-up of her obstructive sleep apnea. He was started on C Pap at the last visit with a moderate pressure on the auto setting, as well as a full face mask. She has had issues with tolerance related to try air which is aggravating her sinuses in the back of her throat. She was not aware that she could increase the heat on her humidifier in order to get more moisture. She feels that her mask is comfortable and not leaking, and only occasionally has issues with the pressure. A download today shows good control of her AHI whenever she wears the device.   Review of Systems  Constitutional: Negative for fever and unexpected weight change.  HENT: Positive for congestion and postnasal drip. Negative for dental problem, ear pain, nosebleeds, rhinorrhea, sinus pressure, sneezing, sore throat and trouble swallowing.   Eyes: Negative for redness and itching.  Respiratory: Positive for cough. Negative for chest tightness, shortness of breath and wheezing.   Cardiovascular: Negative for palpitations and leg swelling.  Gastrointestinal: Negative for nausea and vomiting.  Genitourinary: Negative for dysuria.  Musculoskeletal: Negative for joint swelling.  Skin: Negative for rash.  Hematological: Does not bruise/bleed easily.  Psychiatric/Behavioral: Negative for dysphoric mood. The patient is not nervous/anxious.        Objective:   Physical Exam Obese female in no acute distress Nose without purulence or discharge noted No skin breakdown or pressure necrosis from the C Pap mask Neck without lymphadenopathy or thyromegaly Lower extremities with mild edema, no cyanosis Alert, does not appear to be sleepy, moves all 4 extremities.       Assessment & Plan:

## 2015-02-01 ENCOUNTER — Other Ambulatory Visit: Payer: Self-pay | Admitting: Internal Medicine

## 2015-02-23 ENCOUNTER — Other Ambulatory Visit: Payer: Self-pay | Admitting: Internal Medicine

## 2015-03-17 ENCOUNTER — Ambulatory Visit: Payer: 59 | Admitting: Pulmonary Disease

## 2015-04-05 ENCOUNTER — Encounter: Payer: Self-pay | Admitting: Internal Medicine

## 2015-04-05 ENCOUNTER — Ambulatory Visit (INDEPENDENT_AMBULATORY_CARE_PROVIDER_SITE_OTHER): Payer: 59 | Admitting: Internal Medicine

## 2015-04-05 VITALS — BP 122/82 | HR 71 | Temp 98.0°F | Resp 14 | Wt 305.1 lb

## 2015-04-05 DIAGNOSIS — G5602 Carpal tunnel syndrome, left upper limb: Secondary | ICD-10-CM

## 2015-04-05 DIAGNOSIS — G5601 Carpal tunnel syndrome, right upper limb: Secondary | ICD-10-CM | POA: Diagnosis not present

## 2015-04-05 DIAGNOSIS — G5600 Carpal tunnel syndrome, unspecified upper limb: Secondary | ICD-10-CM | POA: Insufficient documentation

## 2015-04-05 DIAGNOSIS — G5603 Carpal tunnel syndrome, bilateral upper limbs: Secondary | ICD-10-CM

## 2015-04-05 DIAGNOSIS — M65312 Trigger thumb, left thumb: Secondary | ICD-10-CM

## 2015-04-05 MED ORDER — LORATADINE 10 MG PO TABS: 10.0000 mg | ORAL_TABLET | Freq: Every day | ORAL | Status: DC

## 2015-04-05 MED ORDER — ALBUTEROL SULFATE HFA 108 (90 BASE) MCG/ACT IN AERS: 1.0000 | INHALATION_SPRAY | RESPIRATORY_TRACT | Status: DC | PRN

## 2015-04-05 MED ORDER — ALBUTEROL SULFATE 108 (90 BASE) MCG/ACT IN AEPB: 2.0000 | INHALATION_SPRAY | RESPIRATORY_TRACT | Status: DC | PRN

## 2015-04-05 MED ORDER — FAMOTIDINE 20 MG PO TABS: 20.0000 mg | ORAL_TABLET | Freq: Two times a day (BID) | ORAL | Status: DC | PRN

## 2015-04-05 MED ORDER — CYCLOBENZAPRINE HCL 5 MG PO TABS: 5.0000 mg | ORAL_TABLET | Freq: Two times a day (BID) | ORAL | Status: DC | PRN

## 2015-04-05 MED ORDER — FLUTICASONE PROPIONATE 50 MCG/ACT NA SUSP: 2.0000 | NASAL | Status: DC | PRN

## 2015-04-05 NOTE — Patient Instructions (Signed)
We will send you to the hand surgeon (specialist) for the thumb as well as the carpal tunnel. Think about getting wrist braces for the carpal tunnel and rest the hands and wrists when you are able.  Good luck with the weight loss journey and please feel free to call the office with any problems or questions.  Carpal Tunnel Syndrome Carpal tunnel syndrome is a disorder of the nervous system in the wrist that causes pain, hand weakness, and/or loss of feeling. Carpal tunnel syndrome is caused by the compression, stretching, or irritation of the median nerve at the wrist joint. Athletes who experience carpal tunnel syndrome may notice a decrease in their performance to the condition, especially for sports that require strong hand or wrist action.  SYMPTOMS   Tingling, numbness, or burning pain in the hand or fingers.  Inability to sleep due to pain in the hand.  Sharp pains that shoot from the wrist up the arm or to the fingers, especially at night.  Morning stiffness or cramping of the hand.  Thumb weakness, resulting in difficulty holding objects or making a fist.  Shiny, dry skin on the hand.  Reduced performance in any sport requiring a strong grip. CAUSES   Median nerve damage at the wrist is caused by pressure due to swelling, inflammation, or scarred tissue.  Sources of pressure include:  Repetitive gripping or squeezing that causes inflammation of the tendon sheaths.  Scarring or shortening of the ligament that covers the median nerve.  Traumatic injury to the wrist or forearm such as fracture, sprain, or dislocation.  Prolonged hyperextension (wrist bent backward) or hyperflexion (wrist bent downward) of the wrist. RISK INCREASES WITH:  Diabetes mellitus.  Menopause or amenorrhea.  Rheumatoid arthritis.  Raynaud disease.  Pregnancy.  Gout.  Kidney disease.  Ganglion cyst.  Repetitive hand or wrist action.  Hypothyroidism (underactive thyroid  gland).  Repetitive jolting or shaking of the hands or wrist.  Prolonged forceful weight-bearing on the hands. PREVENTION  Bracing the hand and wrist straight during activities that involve repetitive grasping.  For activities that require prolonged extension of the wrist (bending towards the top of the forearm) periodically change the position of your wrists.  Learn and use proper technique in activities that result in the wrist position in neutral to slight extension.  Avoid bending the wrist into full extension or flexion (up or down).  Keep the wrist in a straight (neutral) position. To keep the wrist in this position, wear a splint.  Avoid repetitive hand and wrist motions.  When possible avoid prolonged grasping of items (steering wheel of a car, a pen, a vacuum cleaner, or a rake).  Loosen your grip for activities that require prolonged grasping of items.  Place keyboards and writing surfaces at the correct height as to decrease strain on the wrist and hand.  Alternate work tasks to avoid prolonged wrist flexion.  Avoid pinching activities (needlework and writing) as they may irritate your carpal tunnel syndrome.  If these activities are necessary, complete them for shorter periods of time.  When writing, use a felt tip or rollerball pen and/or build up the grip on a pen to decrease the forces required for writing. PROGNOSIS  Carpal tunnel syndrome is usually curable with appropriate conservative treatment and sometimes resolves spontaneously. For some cases, surgery is necessary, especially if muscle wasting or nerve changes have developed.  RELATED COMPLICATIONS   Permanent numbness and a weak thumb or fingers in the affected hand.  Permanent  paralysis of a portion of the hand and fingers. TREATMENT  Treatment initially consists of stopping activities that aggravate the symptoms as well as medication and ice to reduce inflammation. A wrist splint is often recommended  for wear during activities of repetitive motion as well as at night. It is also important to learn and use proper technique when performing activities that typically cause pain. On occasion, a corticosteroid injection may be given. If symptoms persist despite conservative treatment, surgery may be an option. Surgical techniques free the pinched or compressed nerve. Carpal tunnel surgery is usually performed on an outpatient basis, meaning you go home the same day as surgery. These procedures provide almost complete relief of all symptoms in 95% of patients. Expect at least 2 weeks for healing after surgery. For cases that are the result of repeated jolting or shaking of the hand or wrist or prolonged hyperextension, surgery is not usually recommended because stretching of the median nerve, not compression, is usually the cause of carpal tunnel syndrome in these cases. MEDICATION   If pain medication is necessary, nonsteroidal anti-inflammatory medications, such as aspirin and ibuprofen, or other minor pain relievers, such as acetaminophen, are often recommended.  Do not take pain medication for 7 days before surgery.  Prescription pain relievers are usually only prescribed after surgery. Use only as directed and only as much as you need.  Corticosteroid injections may be given to reduce inflammation. However, they are not always recommended.  Vitamin B6 (pyridoxine) may reduce symptoms; use only if prescribed for your disorder. SEEK MEDICAL CARE IF:   Symptoms get worse or do not improve in 2 weeks despite treatment.  You also have a current or recent history of neck or shoulder injury that has resulted in pain or tingling elsewhere in your arm. Document Released: 07/24/2005 Document Revised: 12/08/2013 Document Reviewed: 11/05/2008 [facility] Patient Information 2015 [place], [place]. This information is not intended to replace advice given to you by your health care Conley Pawling. Make sure you discuss  any questions you have with your health care Salahuddin Arismendez.

## 2015-04-05 NOTE — Assessment & Plan Note (Signed)
She is currently undergoing evaluation for weight reduction surgery at [facility]. Encouraged her on diet and exercise as part of her weight loss plan.

## 2015-04-05 NOTE — Assessment & Plan Note (Signed)
New problem to this office but worse in the last 6 months. Will refer to hand surgeon and given instruction for conservative measures. Advised to get splints for night time and while driving.

## 2015-04-05 NOTE — Progress Notes (Signed)
Pre visit review using our clinic review tool, if applicable. No additional management support is needed unless otherwise documented below in the visit note. 

## 2015-04-05 NOTE — Progress Notes (Signed)
   Subjective:    Patient ID: Alicia Chen, female    DOB: [birth year], 49 y.o.   MRN: [id number]  HPI The patient is a 49 YO female coming in for acute visit for several concerns. She is having some locking of her left thumb as well as pain and decreased ROM. This has been going on for 1-2 months and is worsening over time. She has tried resting the thumb but is still having pain. Has not iced or tried heat. Taking OTC pain medications as needed for pain.  Next problem is numbness in both hands. She does a lot of typing and talking on the phone at work and usually the numbness comes after doing a lot of that. Also driving for long distances does it. She had nerve conduction study in the past with mild carpal tunnel (years ago). It has worsened in the last 6 months and now is daily and causing pain and problems for her at work. She has tried getting handless phone set to help but still has troubles with it.   Review of Systems  Constitutional: Positive for activity change. Negative for fever, chills, appetite change, fatigue and unexpected weight change.  Respiratory: Negative for cough, chest tightness, shortness of breath and wheezing.   Cardiovascular: Negative for chest pain, palpitations and leg swelling.  Gastrointestinal: Negative for abdominal pain, diarrhea, constipation and abdominal distention.  Musculoskeletal: Positive for myalgias and arthralgias.  Skin: Negative.   Neurological: Positive for weakness and numbness.      Objective:   Physical Exam  Constitutional: She is oriented to person, place, and time. She appears well-developed and well-nourished.  Overweight  HENT:  Head: Normocephalic and atraumatic.  Eyes: EOM are normal.  Neck: Normal range of motion.  Cardiovascular: Normal rate and regular rhythm.   Pulmonary/Chest: Effort normal and breath sounds normal.  Abdominal: Soft. Bowel sounds are normal.  Musculoskeletal:  Thumb left locking with no obvious nodules, pain  in the web.  Neurological: She is alert and oriented to person, place, and time. Coordination normal.  Tinnel positive  Skin: Skin is warm and dry.   Filed Vitals:   04/05/15 0833  BP: 122/82  Pulse: 71  Temp: 98 F (36.7 C)  TempSrc: Oral  Resp: 14  Weight: 305 lb 1.9 oz (138.402 kg)  SpO2: 98%      Assessment & Plan:

## 2015-04-05 NOTE — Assessment & Plan Note (Signed)
Thumb is getting stuck and ROM limited. Subjective weakness of the thumb for grip. Refer to hand surgeon for evaluation and intervention.

## 2015-04-06 ENCOUNTER — Ambulatory Visit: Payer: 59 | Admitting: Pulmonary Disease

## 2015-05-21 ENCOUNTER — Other Ambulatory Visit: Payer: Self-pay

## 2015-05-21 DIAGNOSIS — Z1231 Encounter for screening mammogram for malignant neoplasm of breast: Secondary | ICD-10-CM

## 2015-06-11 ENCOUNTER — Ambulatory Visit: Payer: 59

## 2015-07-07 ENCOUNTER — Other Ambulatory Visit: Payer: Self-pay | Admitting: Internal Medicine

## 2015-07-07 NOTE — Telephone Encounter (Signed)
Is the linzess okay to refill?

## 2015-07-07 NOTE — Telephone Encounter (Signed)
OK X1 

## 2015-07-13 ENCOUNTER — Inpatient Hospital Stay: Admission: RE | Admit: 2015-07-13 | Payer: 59 | Source: Ambulatory Visit

## 2015-08-18 DIAGNOSIS — K317 Polyp of stomach and duodenum: Secondary | ICD-10-CM | POA: Insufficient documentation

## 2015-09-08 ENCOUNTER — Ambulatory Visit
Admission: RE | Admit: 2015-09-08 | Discharge: 2015-09-08 | Disposition: A | Discharge status: Home / Self Care | Payer: 59 | Source: Ambulatory Visit

## 2015-09-08 DIAGNOSIS — Z1231 Encounter for screening mammogram for malignant neoplasm of breast: Secondary | ICD-10-CM

## 2015-09-08 HISTORY — PX: LAPAROSCOPIC GASTRIC SLEEVE RESECTION: SHX5895

## 2015-10-14 DIAGNOSIS — Z9884 Bariatric surgery status: Secondary | ICD-10-CM | POA: Insufficient documentation

## 2015-11-02 ENCOUNTER — Encounter (INDEPENDENT_AMBULATORY_CARE_PROVIDER_SITE_OTHER): Payer: Self-pay

## 2015-11-18 ENCOUNTER — Ambulatory Visit: Payer: 59 | Admitting: Family

## 2015-11-22 ENCOUNTER — Ambulatory Visit: Payer: 59 | Admitting: Family

## 2015-11-22 ENCOUNTER — Telehealth: Payer: Self-pay | Admitting: Internal Medicine

## 2015-11-22 DIAGNOSIS — Z0289 Encounter for other administrative examinations: Secondary | ICD-10-CM

## 2015-11-22 NOTE — Telephone Encounter (Signed)
Patient no showed today with Marya Amsler for hormone changes and swelling legs.  Please advise.

## 2015-11-23 NOTE — Telephone Encounter (Signed)
Fine

## 2016-02-11 ENCOUNTER — Other Ambulatory Visit: Payer: Self-pay | Admitting: Internal Medicine

## 2016-02-14 ENCOUNTER — Ambulatory Visit (INDEPENDENT_AMBULATORY_CARE_PROVIDER_SITE_OTHER): Payer: 59 | Admitting: Obstetrics and Gynecology

## 2016-02-14 ENCOUNTER — Encounter: Payer: Self-pay | Admitting: Obstetrics and Gynecology

## 2016-02-14 VITALS — BP 112/72 | HR 80 | Resp 16 | Ht 62.25 in | Wt 211.0 lb

## 2016-02-14 DIAGNOSIS — Z30431 Encounter for routine checking of intrauterine contraceptive device: Secondary | ICD-10-CM

## 2016-02-14 DIAGNOSIS — Z803 Family history of malignant neoplasm of breast: Secondary | ICD-10-CM

## 2016-02-14 DIAGNOSIS — Z01419 Encounter for gynecological examination (general) (routine) without abnormal findings: Secondary | ICD-10-CM

## 2016-02-14 DIAGNOSIS — Z124 Encounter for screening for malignant neoplasm of cervix: Secondary | ICD-10-CM

## 2016-02-14 NOTE — Progress Notes (Signed)
50 y.o. [id number] DivorcedAfrican AmericanF here for annual exam. She has a mirena IUD, since 2015. No menses. Sexually active, same partner x 2 years, no dyspareunia.  She has lost almost 100 lbs. She had a gastric sleeve in 2/17, had lost 30 lbs prior to surgery. She is feeling good. She is exercising. Walking at least 3-4 miles a day, does classes at the gym.   She has a known fibroid uterus.    No LMP recorded. Patient is not currently having periods (Reason: IUD).          Sexually active: Yes.    The current method of family planning is IUD.    Exercising: Yes.    walks 3-5 miles a day/ Cardio Smoker:  no  Health Maintenance: Pap:  2015 WNL per patient  History of abnormal Pap:  no MMG:  09-09-15 WNL Colonoscopy:  Never BMD:   Never TDaP:  2009 Gardasil: N/A   reports that she has never smoked. She has never used smokeless tobacco. She reports that she drinks alcohol. She reports that she does not use illicit drugs.Just moved in with her boyfriend. She has twins, 20 both out of the house. Both work and go to school. Both girls. 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   Past Medical History  Diagnosis Date  . ARTHRITIS   . MIGRAINE HEADACHE   . OBESITY, MORBID   . ALLERGIC RHINITIS   . ASTHMA   . GERD   . DEPRESSION   . ANEMIA-NOS   . Fibroid   . Vitamin D deficiency     Past Surgical History  Procedure Laterality Date  . Cholecystectomy  2009  . Skin graft surgery (r) leg      childhood  . Esophagogastroduodenoscopy N/A 12/23/2012    Procedure: ESOPHAGOGASTRODUODENOSCOPY (EGD);  Surgeon: Juanita Craver, MD;  Location: [facility];  Service: Endoscopy;  Laterality: N/A;  . Intrauterine device (iud) insertion  12-15-13    Mirena  . Laparoscopic gastric sleeve resection  2/17    Current Outpatient Prescriptions  Medication Sig Dispense Refill  . albuterol (VENTOLIN HFA) 108 (90 BASE) MCG/ACT inhaler Inhale 1-2 puffs into the lungs every  4 (four) hours as needed for wheezing or shortness of breath. 18 g 1  . ALPRAZolam (XANAX) 0.5 MG tablet Take 1 tablet (0.5 mg total) by mouth 3 (three) times daily as needed for anxiety. 30 tablet 5  . Cholecalciferol (VITAMIN D) 2000 units tablet Take 2,000 Units by mouth daily.    . cyclobenzaprine (FLEXERIL) 5 MG tablet Take 1 tablet (5 mg total) by mouth 2 (two) times daily as needed for muscle spasms. 30 tablet 1  . DEXILANT 60 MG capsule take 1 capsule by mouth once daily 90 capsule 0  . famotidine (PEPCID) 20 MG tablet Take 1 tablet (20 mg total) by mouth 2 (two) times daily as needed for heartburn or indigestion. 60 tablet 6  . fluticasone (FLONASE) 50 MCG/ACT nasal spray Place 2 sprays into both nostrils as needed for allergies. 16 g 11  . hydrochlorothiazide (HYDRODIURIL) 25 MG tablet take 1/2 to 1 tablets by mouth once daily 30 tablet 3  . LINZESS 145 MCG CAPS capsule take 1 capsule by mouth once daily 30 capsule 1  . loratadine (CLARITIN) 10 MG tablet Take 1 tablet (10 mg total) by mouth daily. 30 tablet 11  . meclizine (ANTIVERT) 25 MG tablet Take 1 tablet (25 mg total) by mouth  3 (three) times daily as needed. 30 tablet 1  . Multiple Vitamin (MULTIVITAMIN) tablet Take 1 tablet by mouth daily.    . calcium carbonate (TUMS - DOSED IN MG ELEMENTAL CALCIUM) 500 MG chewable tablet Chew by mouth.    . magnesium hydroxide (MILK OF MAGNESIA) 400 MG/5ML suspension Take by mouth.     No current facility-administered medications for this visit.    Family History  Problem Relation Age of Onset  . Arthritis Mother   . Arthritis Father   . Alcohol abuse Other     Parents  . Arthritis Other     grandparents  . Diabetes Other     Parent & grandparent  . Hyperlipidemia Other     Parent, grandparent, other relative  . Hypertension Other     parent, grandparent, other relative  . Prostate cancer Other     grandfather  . Breast cancer Sister   . Breast cancer Sister   2 sisters with  breast cancer, first at 84, second at 86. She has 7 sisters on her fathers side, the 40 year old sister was on her Fathers side. Older one on her mother side. 2/6 P aunts with breast cancer. PGM with either ovarian or cervical cancer  Review of Systems  Constitutional: Negative.   HENT: Negative.   Eyes: Negative.   Respiratory: Negative.   Cardiovascular: Negative.   Gastrointestinal: Negative.   Endocrine: Negative.   Genitourinary: Negative.   Musculoskeletal: Negative.   Skin: Negative.   Allergic/Immunologic: Negative.   Neurological: Negative.   Psychiatric/Behavioral: Negative.     Exam:   BP 112/72 mmHg  Pulse 80  Resp 16  Ht 5' 2.25" (1.581 m)  Wt 211 lb (95.709 kg)  BMI 38.29 kg/m2  Weight change: _0 @ Height:   Height: 5' 2.25" (158.1 cm)  Ht Readings from Last 3 Encounters:  02/14/16 5' 2.25" (1.581 m)  09/16/14 _1  (1.6 m)  07/22/14 _2  (1.6 m)    General appearance: alert, cooperative and appears stated age Head: Normocephalic, without obvious abnormality, atraumatic Neck: no adenopathy, supple, symmetrical, trachea midline and thyroid normal to inspection and palpation Lungs: clear to auscultation bilaterally Breasts: normal appearance, no masses or tenderness Heart: regular rate and rhythm Abdomen: soft, non-tender; bowel sounds normal; no masses,  no organomegaly Extremities: extremities normal, atraumatic, no cyanosis or edema Skin: Skin color, texture, turgor normal. No rashes or lesions Lymph nodes: Cervical, supraclavicular, and axillary nodes normal. No abnormal inguinal nodes palpated Neurologic: Grossly normal   Pelvic: External genitalia:  no lesions              Urethra:  normal appearing urethra with no masses, tenderness or lesions              Bartholins and Skenes: normal                 Vagina: normal appearing vagina with normal color and discharge, no lesions              Cervix: no lesions and IUD string 2 cm                Bimanual Exam:  Uterus:  retroverted, slightly enlarged, fibroid felt posteriorly 4 cm. Not tender, mobile.               Adnexa: no mass, fullness, tenderness               Rectovaginal: Confirms  Anus:  normal sphincter tone, no lesions  Chaperone was present for exam.  A:  Well Woman with normal exam (small fibroid on exam, c/w reported history)  IUD check up  FH of breast cancer in her paternal 1/2 sister. Father with 2/6 sisters with breast cancer (PMP), PGM with possible ovarian cancer  P:   Pap with hpv  Discussed BSE, she is getting yearly 3D mammograms  She will talk with her sister who had the early onset breast cancer about BRCA testing  Once she has her family history, consider evaluation by genetic counselor

## 2016-02-14 NOTE — Patient Instructions (Addendum)
EXERCISE AND DIET:  We recommended that you start or continue a regular exercise program for good health. Regular exercise means any activity that makes your heart beat faster and makes you sweat.  We recommend exercising at least 30 minutes per day at least 3 days a week, preferably 4 or 5.  We also recommend a diet low in fat and sugar.  Inactivity, poor dietary choices and obesity can cause diabetes, heart attack, stroke, and kidney damage, among others.    ALCOHOL AND SMOKING:  Women should limit their alcohol intake to no more than 7 drinks/beers/glasses of wine (combined, not each!) per week. Moderation of alcohol intake to this level decreases your risk of breast cancer and liver damage. And of course, no recreational drugs are part of a healthy lifestyle.  And absolutely no smoking or even second hand smoke. Most people know smoking can cause heart and lung diseases, but did you know it also contributes to weakening of your bones? Aging of your skin?  Yellowing of your teeth and nails?  CALCIUM AND VITAMIN D:  Adequate intake of calcium and Vitamin D are recommended.  The recommendations for exact amounts of these supplements seem to change often, but generally speaking 600 mg of calcium (either carbonate or citrate) and 800 units of Vitamin D per day seems prudent. Certain women may benefit from higher intake of Vitamin D.  If you are among these women, your doctor will have told you during your visit.    PAP SMEARS:  Pap smears, to check for cervical cancer or precancers,  have traditionally been done yearly, although recent scientific advances have shown that most women can have pap smears less often.  However, every woman still should have a physical exam from her gynecologist every year. It will include a breast check, inspection of the vulva and vagina to check for abnormal growths or skin changes, a visual exam of the cervix, and then an exam to evaluate the size and shape of the uterus and  ovaries.  And after 50 years of age, a rectal exam is indicated to check for rectal cancers. We will also provide age appropriate advice regarding health maintenance, like when you should have certain vaccines, screening for sexually transmitted diseases, bone density testing, colonoscopy, mammograms, etc.   MAMMOGRAMS:  All women over 40 years old should have a yearly mammogram. Many facilities now offer a "3D" mammogram, which may cost around $50 extra out of pocket. If possible,  we recommend you accept the option to have the 3D mammogram performed.  It both reduces the number of women who will be called back for extra views which then turn out to be normal, and it is better than the routine mammogram at detecting truly abnormal areas.    COLONOSCOPY:  Colonoscopy to screen for colon cancer is recommended for all women at age 50.  We know, you hate the idea of the prep.  We agree, BUT, having colon cancer and not knowing it is worse!!  Colon cancer so often starts as a polyp that can be seen and removed at colonscopy, which can quite literally save your life!  And if your first colonoscopy is normal and you have no family history of colon cancer, most women don't have to have it again for 10 years.  Once every ten years, you can do something that may end up saving your life, right?  We will be happy to help you get it scheduled when you are ready.    Be sure to check your insurance coverage so you understand how much it will cost.  It may be covered as a preventative service at no cost, but you should check your particular policy.      Breast Self-Awareness Practicing breast self-awareness may pick up problems early, prevent significant medical complications, and possibly save your life. By practicing breast self-awareness, you can become familiar with how your breasts look and feel and if your breasts are changing. This allows you to notice changes early. It can also offer you some reassurance that your  breast health is good. One way to learn what is normal for your breasts and whether your breasts are changing is to do a breast self-exam. If you find a lump or something that was not present in the past, it is best to contact your caregiver right away. Other findings that should be evaluated by your caregiver include nipple discharge, especially if it is bloody; skin changes or reddening; areas where the skin seems to be pulled in (retracted); or new lumps and bumps. Breast pain is seldom associated with cancer (malignancy), but should also be evaluated by a caregiver. HOW TO PERFORM A BREAST SELF-EXAM The best time to examine your breasts is 5-7 days after your menstrual period is over. During menstruation, the breasts are lumpier, and it may be more difficult to pick up changes. If you do not menstruate, have reached menopause, or had your uterus removed (hysterectomy), you should examine your breasts at regular intervals, such as monthly. If you are breastfeeding, examine your breasts after a feeding or after using a breast pump. Breast implants do not decrease the risk for lumps or tumors, so continue to perform breast self-exams as recommended. Talk to your caregiver about how to determine the difference between the implant and breast tissue. Also, talk about the amount of pressure you should use during the exam. Over time, you will become more familiar with the variations of your breasts and more comfortable with the exam. A breast self-exam requires you to remove all your clothes above the waist. 1. Look at your breasts and nipples. Stand in front of a mirror in a room with good lighting. With your hands on your hips, push your hands firmly downward. Look for a difference in shape, contour, and size from one breast to the other (asymmetry). Asymmetry includes puckers, dips, or bumps. Also, look for skin changes, such as reddened or scaly areas on the breasts. Look for nipple changes, such as discharge,  dimpling, repositioning, or redness. 2. Carefully feel your breasts. This is best done either in the shower or tub while using soapy water or when flat on your back. Place the arm (on the side of the breast you are examining) above your head. Use the pads (not the fingertips) of your three middle fingers on your opposite hand to feel your breasts. Start in the underarm area and use  inch (2 cm) overlapping circles to feel your breast. Use 3 different levels of pressure (light, medium, and firm pressure) at each circle before moving to the next circle. The light pressure is needed to feel the tissue closest to the skin. The medium pressure will help to feel breast tissue a little deeper, while the firm pressure is needed to feel the tissue close to the ribs. Continue the overlapping circles, moving downward over the breast until you feel your ribs below your breast. Then, move one finger-width towards the center of the body. Continue to use the    inch (2 cm) overlapping circles to feel your breast as you move slowly up toward the collar bone (clavicle) near the base of the neck. Continue the up and down exam using all 3 pressures until you reach the middle of the chest. Do this with each breast, carefully feeling for lumps or changes. 3.  Keep a written record with breast changes or normal findings for each breast. By writing this information down, you do not need to depend only on memory for size, tenderness, or location. Write down where you are in your menstrual cycle, if you are still menstruating. Breast tissue can have some lumps or thick tissue. However, see your caregiver if you find anything that concerns you.  SEEK MEDICAL CARE IF:  You see a change in shape, contour, or size of your breasts or nipples.   You see skin changes, such as reddened or scaly areas on the breasts or nipples.   You have an unusual discharge from your nipples.   You feel a new lump or unusually thick areas.     This information is not intended to replace advice given to you by your health care provider. Make sure you discuss any questions you have with your health care provider.   Document Released: 07/24/2005 Document Revised: 07/10/2012 Document Reviewed: 11/08/2011 Elsevier Interactive Patient Education 2016 [employer]  

## 2016-02-16 LAB — IPS PAP TEST WITH HPV

## 2016-04-25 ENCOUNTER — Encounter: Payer: Self-pay | Admitting: Student

## 2016-05-29 ENCOUNTER — Other Ambulatory Visit: Payer: Self-pay | Admitting: Internal Medicine

## 2016-06-28 ENCOUNTER — Ambulatory Visit: Payer: 59 | Admitting: Internal Medicine

## 2016-06-28 ENCOUNTER — Ambulatory Visit (INDEPENDENT_AMBULATORY_CARE_PROVIDER_SITE_OTHER): Payer: 59

## 2016-06-28 DIAGNOSIS — Z23 Encounter for immunization: Secondary | ICD-10-CM | POA: Diagnosis not present

## 2016-07-11 ENCOUNTER — Other Ambulatory Visit: Payer: 59

## 2016-07-11 ENCOUNTER — Ambulatory Visit (INDEPENDENT_AMBULATORY_CARE_PROVIDER_SITE_OTHER): Payer: 59 | Admitting: Internal Medicine

## 2016-07-11 ENCOUNTER — Encounter: Payer: Self-pay | Admitting: Internal Medicine

## 2016-07-11 VITALS — BP 106/66 | HR 64 | Temp 98.1°F | Resp 12 | Ht 63.0 in | Wt 178.0 lb

## 2016-07-11 DIAGNOSIS — Z6831 Body mass index (BMI) 31.0-31.9, adult: Secondary | ICD-10-CM | POA: Diagnosis not present

## 2016-07-11 DIAGNOSIS — Z1211 Encounter for screening for malignant neoplasm of colon: Secondary | ICD-10-CM | POA: Diagnosis not present

## 2016-07-11 DIAGNOSIS — Z0000 Encounter for general adult medical examination without abnormal findings: Secondary | ICD-10-CM

## 2016-07-11 DIAGNOSIS — E669 Obesity, unspecified: Secondary | ICD-10-CM

## 2016-07-11 MED ORDER — FAMOTIDINE 20 MG PO TABS: 20.0000 mg | ORAL_TABLET | Freq: Two times a day (BID) | ORAL | 3 refills | Status: DC | PRN

## 2016-07-11 MED ORDER — MECLIZINE HCL 25 MG PO TABS: 25.0000 mg | ORAL_TABLET | Freq: Three times a day (TID) | ORAL | 0 refills | Status: DC | PRN

## 2016-07-11 MED ORDER — LINACLOTIDE 145 MCG PO CAPS: 145.0000 ug | ORAL_CAPSULE | Freq: Every day | ORAL | 3 refills | Status: DC

## 2016-07-11 MED ORDER — FLUTICASONE PROPIONATE 50 MCG/ACT NA SUSP: 2.0000 | NASAL | 3 refills | Status: DC | PRN

## 2016-07-11 MED ORDER — LORATADINE 10 MG PO TABS: 10.0000 mg | ORAL_TABLET | Freq: Every day | ORAL | 3 refills | Status: DC

## 2016-07-11 MED ORDER — DEXLANSOPRAZOLE 60 MG PO CPDR: 1.0000 | DELAYED_RELEASE_CAPSULE | Freq: Every day | ORAL | 3 refills | Status: DC

## 2016-07-11 MED ORDER — ALPRAZOLAM 0.5 MG PO TABS: 0.5000 mg | ORAL_TABLET | Freq: Every day | ORAL | 0 refills | Status: DC | PRN

## 2016-07-11 NOTE — Progress Notes (Signed)
Pre visit review using our clinic review tool, if applicable. No additional management support is needed unless otherwise documented below in the visit note. 

## 2016-07-11 NOTE — Assessment & Plan Note (Signed)
S/P weight loss procedure and down about 100 pounds.

## 2016-07-11 NOTE — Progress Notes (Signed)
   Subjective:    Patient ID: Alicia Chen, female    DOB: [birth year], 50 y.o.   MRN: [id number]  HPI The patient is a 50 YO female coming in for wellness.   PMH, [facility], social history reviewed and updated.   Review of Systems  Constitutional: Negative.   HENT: Negative.   Eyes: Negative.   Respiratory: Negative for cough, chest tightness and shortness of breath.   Cardiovascular: Negative for chest pain, palpitations and leg swelling.  Gastrointestinal: Negative for abdominal distention, abdominal pain, constipation, diarrhea, nausea and vomiting.  Musculoskeletal: Negative.   Skin: Negative.   Neurological: Negative.   Psychiatric/Behavioral: Negative.       Objective:   Physical Exam  Constitutional: She is oriented to person, place, and time. She appears well-developed and well-nourished.  HENT:  Head: Normocephalic and atraumatic.  Eyes: EOM are normal.  Neck: Normal range of motion.  Cardiovascular: Normal rate and regular rhythm.   Pulmonary/Chest: Effort normal and breath sounds normal. No respiratory distress. She has no wheezes. She has no rales.  Abdominal: Soft. Bowel sounds are normal. She exhibits no distension. There is no tenderness. There is no rebound.  Musculoskeletal: She exhibits no edema.  Neurological: She is alert and oriented to person, place, and time. Coordination normal.  Skin: Skin is warm and dry.  Psychiatric: She has a normal mood and affect.   Vitals:   07/11/16 1301  BP: 106/66  Pulse: 64  Resp: 12  Temp: 98.1 F (36.7 C)  TempSrc: Oral  SpO2: 98%  Weight: 178 lb (80.7 kg)  Height: 5\' 3"  (1.6 m)      Assessment & Plan:

## 2016-07-11 NOTE — Assessment & Plan Note (Signed)
Gi referral for colon cancer screening. Flu and tetanus up to date. Mammogram and pap smear up to date. Counseled on sun safety and dangers of distracted driving. Given screening recommendations.

## 2016-07-11 NOTE — Patient Instructions (Signed)
We are checking the labs today and will call you back with the results.   You are doing great so keep up the good work!  We will have the GI office call you about getting in for the colonoscopy.   Health Maintenance, Female Introduction Adopting a healthy lifestyle and getting preventive care can go a long way to promote health and wellness. Talk with your health care provider about what schedule of regular examinations is right for you. This is a good chance for you to check in with your provider about disease prevention and staying healthy. In between checkups, there are plenty of things you can do on your own. Experts have done a lot of research about which lifestyle changes and preventive measures are most likely to keep you healthy. Ask your health care provider for more information. Weight and diet Eat a healthy diet  Be sure to include plenty of vegetables, fruits, low-fat dairy products, and lean protein.  Do not eat a lot of foods high in solid fats, added sugars, or salt.  Get regular exercise. This is one of the most important things you can do for your health.  Most adults should exercise for at least 150 minutes each week. The exercise should increase your heart rate and make you sweat (moderate-intensity exercise).  Most adults should also do strengthening exercises at least twice a week. This is in addition to the moderate-intensity exercise. Maintain a healthy weight  Body mass index (BMI) is a measurement that can be used to identify possible weight problems. It estimates body fat based on height and weight. Your health care provider can help determine your BMI and help you achieve or maintain a healthy weight.  For females 20 years of age and older:  A BMI below 18.5 is considered underweight.  A BMI of 18.5 to 24.9 is normal.  A BMI of 25 to 29.9 is considered overweight.  A BMI of 30 and above is considered obese. Watch levels of cholesterol and blood  lipids  You should start having your blood tested for lipids and cholesterol at 50 years of age, then have this test every 5 years.  You may need to have your cholesterol levels checked more often if:  Your lipid or cholesterol levels are high.  You are older than 50 years of age.  You are at high risk for heart disease. Cancer screening Lung Cancer  Lung cancer screening is recommended for adults 17-36 years old who are at high risk for lung cancer because of a history of smoking.  A yearly low-dose CT scan of the lungs is recommended for people who:  Currently smoke.  Have quit within the past 15 years.  Have at least a 30-pack-year history of smoking. A pack year is smoking an average of one pack of cigarettes a day for 1 year.  Yearly screening should continue until it has been 15 years since you quit.  Yearly screening should stop if you develop a health problem that would prevent you from having lung cancer treatment. Breast Cancer  Practice breast self-awareness. This means understanding how your breasts normally appear and feel.  It also means doing regular breast self-exams. Let your health care provider know about any changes, no matter how small.  If you are in your 20s or 30s, you should have a clinical breast exam (CBE) by a health care provider every 1-3 years as part of a regular health exam.  If you are 40 or older,  have a CBE every year. Also consider having a breast X-ray (mammogram) every year.  If you have a family history of breast cancer, talk to your health care provider about genetic screening.  If you are at high risk for breast cancer, talk to your health care provider about having an MRI and a mammogram every year.  Breast cancer gene (BRCA) assessment is recommended for women who have family members with BRCA-related cancers. BRCA-related cancers include:  Breast.  Ovarian.  Tubal.  Peritoneal cancers.  Results of the assessment will  determine the need for genetic counseling and BRCA1 and BRCA2 testing. Cervical Cancer  Your health care provider may recommend that you be screened regularly for cancer of the pelvic organs (ovaries, uterus, and vagina). This screening involves a pelvic examination, including checking for microscopic changes to the surface of your cervix (Pap test). You may be encouraged to have this screening done every 3 years, beginning at age 49.  For women ages 13-65, health care providers may recommend pelvic exams and Pap testing every 3 years, or they may recommend the Pap and pelvic exam, combined with testing for human papilloma virus (HPV), every 5 years. Some types of HPV increase your risk of cervical cancer. Testing for HPV may also be done on women of any age with unclear Pap test results.  Other health care providers may not recommend any screening for nonpregnant women who are considered low risk for pelvic cancer and who do not have symptoms. Ask your health care provider if a screening pelvic exam is right for you.  If you have had past treatment for cervical cancer or a condition that could lead to cancer, you need Pap tests and screening for cancer for at least 20 years after your treatment. If Pap tests have been discontinued, your risk factors (such as having a new sexual partner) need to be reassessed to determine if screening should resume. Some women have medical problems that increase the chance of getting cervical cancer. In these cases, your health care provider may recommend more frequent screening and Pap tests. Colorectal Cancer  This type of cancer can be detected and often prevented.  Routine colorectal cancer screening usually begins at 50 years of age and continues through 50 years of age.  Your health care provider may recommend screening at an earlier age if you have risk factors for colon cancer.  Your health care provider may also recommend using home test kits to check for  hidden blood in the stool.  A small camera at the end of a tube can be used to examine your colon directly (sigmoidoscopy or colonoscopy). This is done to check for the earliest forms of colorectal cancer.  Routine screening usually begins at age 54.  Direct examination of the colon should be repeated every 5-10 years through 50 years of age. However, you may need to be screened more often if early forms of precancerous polyps or small growths are found. Skin Cancer  Check your skin from head to toe regularly.  Tell your health care provider about any new moles or changes in moles, especially if there is a change in a mole's shape or color.  Also tell your health care provider if you have a mole that is larger than the size of a pencil eraser.  Always use sunscreen. Apply sunscreen liberally and repeatedly throughout the day.  Protect yourself by wearing long sleeves, pants, a wide-brimmed hat, and sunglasses whenever you are outside. Heart disease, diabetes,  and high blood pressure  High blood pressure causes heart disease and increases the risk of stroke. High blood pressure is more likely to develop in:  People who have blood pressure in the high end of the normal range (130-139/85-89 mm Hg).  People who are overweight or obese.  People who are African American.  If you are 34-32 years of age, have your blood pressure checked every 3-5 years. If you are 61 years of age or older, have your blood pressure checked every year. You should have your blood pressure measured twice-once when you are at a hospital or clinic, and once when you are not at a hospital or clinic. Record the average of the two measurements. To check your blood pressure when you are not at a hospital or clinic, you can use:  An automated blood pressure machine at a pharmacy.  A home blood pressure monitor.  If you are between 26 years and 30 years old, ask your health care provider if you should take aspirin to  prevent strokes.  Have regular diabetes screenings. This involves taking a blood sample to check your fasting blood sugar level.  If you are at a normal weight and have a low risk for diabetes, have this test once every three years after 50 years of age.  If you are overweight and have a high risk for diabetes, consider being tested at a younger age or more often. Preventing infection Hepatitis B  If you have a higher risk for hepatitis B, you should be screened for this virus. You are considered at high risk for hepatitis B if:  You were born in a country where hepatitis B is common. Ask your health care provider which countries are considered high risk.  Your parents were born in a high-risk country, and you have not been immunized against hepatitis B (hepatitis B vaccine).  You have HIV or AIDS.  You use needles to inject street drugs.  You live with someone who has hepatitis B.  You have had sex with someone who has hepatitis B.  You get hemodialysis treatment.  You take certain medicines for conditions, including cancer, organ transplantation, and autoimmune conditions. Hepatitis C  Blood testing is recommended for:  Everyone born from [birth year] through 1965.  Anyone with known risk factors for hepatitis C. Sexually transmitted infections (STIs)  You should be screened for sexually transmitted infections (STIs) including gonorrhea and chlamydia if:  You are sexually active and are younger than 50 years of age.  You are older than 50 years of age and your health care provider tells you that you are at risk for this type of infection.  Your sexual activity has changed since you were last screened and you are at an increased risk for chlamydia or gonorrhea. Ask your health care provider if you are at risk.  If you do not have HIV, but are at risk, it may be recommended that you take a prescription medicine daily to prevent HIV infection. This is called pre-exposure  prophylaxis (PrEP). You are considered at risk if:  You are sexually active and do not regularly use condoms or know the HIV status of your partner(s).  You take drugs by injection.  You are sexually active with a partner who has HIV. Talk with your health care provider about whether you are at high risk of being infected with HIV. If you choose to begin PrEP, you should first be tested for HIV. You should then be tested every  3 months for as long as you are taking PrEP. Pregnancy  If you are premenopausal and you may become pregnant, ask your health care provider about preconception counseling.  If you may become pregnant, take 400 to 800 micrograms (mcg) of folic acid every day.  If you want to prevent pregnancy, talk to your health care provider about birth control (contraception). Osteoporosis and menopause  Osteoporosis is a disease in which the bones lose minerals and strength with aging. This can result in serious bone fractures. Your risk for osteoporosis can be identified using a bone density scan.  If you are 48 years of age or older, or if you are at risk for osteoporosis and fractures, ask your health care provider if you should be screened.  Ask your health care provider whether you should take a calcium or vitamin D supplement to lower your risk for osteoporosis.  Menopause may have certain physical symptoms and risks.  Hormone replacement therapy may reduce some of these symptoms and risks. Talk to your health care provider about whether hormone replacement therapy is right for you. Follow these instructions at home:  Schedule regular health, dental, and eye exams.  Stay current with your immunizations.  Do not use any tobacco products including cigarettes, chewing tobacco, or electronic cigarettes.  If you are pregnant, do not drink alcohol.  If you are breastfeeding, limit how much and how often you drink alcohol.  Limit alcohol intake to no more than 1 drink  per day for nonpregnant women. One drink equals 12 ounces of beer, 5 ounces of wine, or 1 ounces of hard liquor.  Do not use street drugs.  Do not share needles.  Ask your health care provider for help if you need support or information about quitting drugs.  Tell your health care provider if you often feel depressed.  Tell your health care provider if you have ever been abused or do not feel safe at home. This information is not intended to replace advice given to you by your health care provider. Make sure you discuss any questions you have with your health care provider. Document Released: 02/06/2011 Document Revised: 12/30/2015 Document Reviewed: 04/27/2015  2017 Elsevier

## 2016-08-31 DIAGNOSIS — K909 Intestinal malabsorption, unspecified: Secondary | ICD-10-CM | POA: Diagnosis not present

## 2016-09-14 DIAGNOSIS — K317 Polyp of stomach and duodenum: Secondary | ICD-10-CM | POA: Diagnosis not present

## 2016-09-14 DIAGNOSIS — Z9884 Bariatric surgery status: Secondary | ICD-10-CM | POA: Diagnosis not present

## 2016-09-14 DIAGNOSIS — K909 Intestinal malabsorption, unspecified: Secondary | ICD-10-CM | POA: Diagnosis not present

## 2016-09-21 ENCOUNTER — Encounter: Payer: Self-pay | Admitting: Internal Medicine

## 2016-09-25 ENCOUNTER — Other Ambulatory Visit: Payer: Self-pay | Admitting: Internal Medicine

## 2016-09-25 DIAGNOSIS — Z1231 Encounter for screening mammogram for malignant neoplasm of breast: Secondary | ICD-10-CM

## 2016-10-02 ENCOUNTER — Ambulatory Visit
Admission: RE | Admit: 2016-10-02 | Discharge: 2016-10-02 | Disposition: A | Discharge status: Home / Self Care | Payer: 59 | Source: Ambulatory Visit | Attending: Internal Medicine | Admitting: Internal Medicine

## 2016-10-02 DIAGNOSIS — Z1231 Encounter for screening mammogram for malignant neoplasm of breast: Secondary | ICD-10-CM | POA: Diagnosis not present

## 2016-11-09 ENCOUNTER — Emergency Department (HOSPITAL_BASED_OUTPATIENT_CLINIC_OR_DEPARTMENT_OTHER)
Admission: EM | Admit: 2016-11-09 | Discharge: 2016-11-09 | Disposition: A | Discharge status: Home / Self Care | Payer: 59 | Attending: Emergency Medicine | Admitting: Emergency Medicine

## 2016-11-09 ENCOUNTER — Emergency Department (HOSPITAL_BASED_OUTPATIENT_CLINIC_OR_DEPARTMENT_OTHER): Payer: 59

## 2016-11-09 ENCOUNTER — Encounter (HOSPITAL_BASED_OUTPATIENT_CLINIC_OR_DEPARTMENT_OTHER): Payer: Self-pay | Admitting: Emergency Medicine

## 2016-11-09 DIAGNOSIS — R1013 Epigastric pain: Secondary | ICD-10-CM | POA: Insufficient documentation

## 2016-11-09 DIAGNOSIS — J45909 Unspecified asthma, uncomplicated: Secondary | ICD-10-CM | POA: Diagnosis not present

## 2016-11-09 DIAGNOSIS — R112 Nausea with vomiting, unspecified: Secondary | ICD-10-CM | POA: Diagnosis not present

## 2016-11-09 DIAGNOSIS — R1010 Upper abdominal pain, unspecified: Secondary | ICD-10-CM | POA: Diagnosis not present

## 2016-11-09 DIAGNOSIS — R740 Nonspecific elevation of levels of transaminase and lactic acid dehydrogenase [LDH]: Secondary | ICD-10-CM | POA: Diagnosis not present

## 2016-11-09 DIAGNOSIS — R1011 Right upper quadrant pain: Secondary | ICD-10-CM | POA: Diagnosis not present

## 2016-11-09 DIAGNOSIS — R7401 Elevation of levels of liver transaminase levels: Secondary | ICD-10-CM

## 2016-11-09 LAB — CBC WITH DIFFERENTIAL/PLATELET
BASOS PCT: 0 %
Basophils Absolute: 0 10*3/uL (ref 0.0–0.1)
EOS ABS: 0 10*3/uL (ref 0.0–0.7)
Eosinophils Relative: 0 %
HEMATOCRIT: 38.2 % (ref 36.0–46.0)
HEMOGLOBIN: 12.5 g/dL (ref 12.0–15.0)
LYMPHS ABS: 1.3 10*3/uL (ref 0.7–4.0)
Lymphocytes Relative: 13 %
MCH: 26.9 pg (ref 26.0–34.0)
MCHC: 32.7 g/dL (ref 30.0–36.0)
MCV: 82.2 fL (ref 78.0–100.0)
MONOS PCT: 1 %
Monocytes Absolute: 0.1 10*3/uL (ref 0.1–1.0)
NEUTROS ABS: 8.8 10*3/uL — AB (ref 1.7–7.7)
NEUTROS PCT: 86 %
Platelets: 257 10*3/uL (ref 150–400)
RBC: 4.65 MIL/uL (ref 3.87–5.11)
RDW: 14.9 % (ref 11.5–15.5)
WBC: 10.2 10*3/uL (ref 4.0–10.5)

## 2016-11-09 LAB — COMPREHENSIVE METABOLIC PANEL
ALBUMIN: 4.1 g/dL (ref 3.5–5.0)
ALK PHOS: 107 U/L (ref 38–126)
ALT: 60 U/L — ABNORMAL HIGH (ref 14–54)
ANION GAP: 6 (ref 5–15)
AST: 100 U/L — ABNORMAL HIGH (ref 15–41)
BILIRUBIN TOTAL: 0.8 mg/dL (ref 0.3–1.2)
BUN: 17 mg/dL (ref 6–20)
CALCIUM: 9.1 mg/dL (ref 8.9–10.3)
CO2: 27 mmol/L (ref 22–32)
CREATININE: 0.95 mg/dL (ref 0.44–1.00)
Chloride: 105 mmol/L (ref 101–111)
GFR calc Af Amer: >60 mL/min (ref >60)
GFR calc non Af Amer: >60 mL/min (ref >60)
GLUCOSE: 121 mg/dL — AB (ref 65–99)
Potassium: 3.5 mmol/L (ref 3.5–5.1)
SODIUM: 138 mmol/L (ref 135–145)
TOTAL PROTEIN: 7.2 g/dL (ref 6.5–8.1)

## 2016-11-09 LAB — URINALYSIS, ROUTINE W REFLEX MICROSCOPIC
BILIRUBIN URINE: NEGATIVE
Glucose, UA: NEGATIVE mg/dL
KETONES UR: 15 mg/dL — AB
Leukocytes, UA: NEGATIVE
NITRITE: NEGATIVE
PROTEIN: NEGATIVE mg/dL
Specific Gravity, Urine: 1.022 (ref 1.005–1.030)
pH: 5 (ref 5.0–8.0)

## 2016-11-09 LAB — LIPASE, BLOOD: Lipase: 26 U/L (ref 11–51)

## 2016-11-09 LAB — URINALYSIS, MICROSCOPIC (REFLEX): WBC UA: NONE SEEN WBC/hpf (ref 0–5)

## 2016-11-09 LAB — PREGNANCY, URINE: PREG TEST UR: NEGATIVE

## 2016-11-09 MED ORDER — ONDANSETRON HCL 4 MG/2ML IJ SOLN
4.0000 mg | Freq: Once | INTRAMUSCULAR | Status: AC
Start: 2016-11-09 — End: 2016-11-09
  Administered 2016-11-09: 4 mg via INTRAVENOUS
  Filled 2016-11-09: qty 2

## 2016-11-09 MED ORDER — GI COCKTAIL ~~LOC~~
30.0000 mL | Freq: Once | ORAL | Status: AC
  Administered 2016-11-09: 30 mL via ORAL
  Filled 2016-11-09: qty 30

## 2016-11-09 MED ORDER — PANTOPRAZOLE SODIUM 40 MG IV SOLR
40.0000 mg | Freq: Once | INTRAVENOUS | Status: AC
  Administered 2016-11-09: 40 mg via INTRAVENOUS
  Filled 2016-11-09: qty 40

## 2016-11-09 NOTE — ED Notes (Signed)
c/o abd pain and back pain x 2 hours, vomited x 1   Denies ua sx

## 2016-11-09 NOTE — ED Triage Notes (Signed)
Pt reports awaking with abd pain and back pain x 2 hours ago.

## 2016-11-09 NOTE — Discharge Instructions (Signed)
You were seen today for upper abdominal pain. This may be related to your known reflux. You were noted to have some elevated liver enzymes. Follow-up with her primary doctor for recheck of these in one week. If you have any new or worsening symptoms she needs be reevaluated.

## 2016-11-09 NOTE — ED Provider Notes (Signed)
[facility] Provider Note   CSN: [id number] Arrival date & time: 11/09/16  0105     History   Chief Complaint Chief Complaint  Patient presents with  . Abdominal Pain    HPI LINSEY ARTEAGA is a 51 y.o. female.  HPI  This a 51 year old female who presents with acute onset of mid epigastric pain that started this evening. Patient has a history of gastric sleeve in fall 2017. She states that she also is on Dexilant for severe reflux symptoms. She reports that she had killed chips and ice cream for dinner. She went to bed and had acute onset of sharp pain that radiated to her back. Current pain is 9 out of 10. She took a Pepcid with some relief. She also reports nausea and one episode of vomiting. She states that she vomited up her Tums. Denies any diarrhea.  Denies fevers, chest pain, shortness of breath.  Past Medical History:  Diagnosis Date  . ALLERGIC RHINITIS   . ANEMIA-NOS   . ARTHRITIS   . ASTHMA   . DEPRESSION   . Fibroid   . GERD   . MIGRAINE HEADACHE   . OBESITY, MORBID   . Vitamin D deficiency     Patient Active Problem List   Diagnosis Date Noted  . Routine general medical examination at a health care facility 07/11/2016  . Trigger thumb of left hand 04/05/2015  . OSA (obstructive sleep apnea) 02/05/2013  . Obesity 09/26/2010  . ANEMIA-NOS 02/09/2010  . DEPRESSION 02/09/2010  . MIGRAINE HEADACHE 02/09/2010  . ALLERGIC RHINITIS 02/09/2010  . ASTHMA 02/09/2010  . GERD 02/09/2010  . ARTHRITIS 02/09/2010    Past Surgical History:  Procedure Laterality Date  . CHOLECYSTECTOMY  2009  . ESOPHAGOGASTRODUODENOSCOPY N/A 12/23/2012   Procedure: ESOPHAGOGASTRODUODENOSCOPY (EGD);  Surgeon: Juanita Craver, MD;  Location: [facility];  Service: Endoscopy;  Laterality: N/A;  . INTRAUTERINE DEVICE (IUD) INSERTION  12-15-13   Mirena  . [place] RESECTION  2/17  . Skin graft surgery (R) leg     childhood    OB History    Gravida Para Term  Preterm AB Living   2 2 1 1   2    SAB TAB Ectopic Multiple Live Births         2 4       Home Medications    Prior to Admission medications   Medication Sig Start Date End Date Taking? Authorizing Provider  albuterol (VENTOLIN HFA) 108 (90 BASE) MCG/ACT inhaler Inhale 1-2 puffs into the lungs every 4 (four) hours as needed for wheezing or shortness of breath. 04/05/15   Hoyt Koch, MD  ALPRAZolam Duanne Moron) 0.5 MG tablet Take 1 tablet (0.5 mg total) by mouth daily as needed for anxiety. 07/11/16   Hoyt Koch, MD  calcium carbonate (TUMS - DOSED IN MG ELEMENTAL CALCIUM) 500 MG chewable tablet Chew by mouth.    Historical Provider, MD  Cholecalciferol (VITAMIN D) 2000 units tablet Take 2,000 Units by mouth daily.    Historical Provider, MD  dexlansoprazole (DEXILANT) 60 MG capsule Take 1 capsule (60 mg total) by mouth daily. 07/11/16   Hoyt Koch, MD  famotidine (PEPCID) 20 MG tablet Take 1 tablet (20 mg total) by mouth 2 (two) times daily as needed for heartburn or indigestion. 07/11/16   Hoyt Koch, MD  fluticasone (FLONASE) 50 MCG/ACT nasal spray Place 2 sprays into both nostrils as needed for allergies. 07/11/16   Real Cons  Sharlet Salina, MD  linaclotide [facility]) 145 MCG CAPS capsule Take 1 capsule (145 mcg total) by mouth daily. 07/11/16   Hoyt Koch, MD  loratadine (CLARITIN) 10 MG tablet Take 1 tablet (10 mg total) by mouth daily. 07/11/16   Hoyt Koch, MD  magnesium hydroxide (MILK OF MAGNESIA) 400 MG/5ML suspension Take by mouth.    Historical Provider, MD  meclizine (ANTIVERT) 25 MG tablet Take 1 tablet (25 mg total) by mouth 3 (three) times daily as needed. 07/11/16   Hoyt Koch, MD  Multiple Vitamin (MULTIVITAMIN) tablet Take 1 tablet by mouth daily.    Historical Provider, MD    Family History Family History  Problem Relation Age of Onset  . Arthritis Mother   . Arthritis Father   . Alcohol abuse Other     Parents  .  Arthritis Other     grandparents  . Diabetes Other     Parent & grandparent  . Hyperlipidemia Other     Parent, grandparent, other relative  . Hypertension Other     parent, grandparent, other relative  . Prostate cancer Other     grandfather  . Breast cancer Sister   . Breast cancer Sister     Social History Social History  Substance Use Topics  . Smoking status: Never Smoker  . Smokeless tobacco: Never Used  . Alcohol use Yes     Comment: Social     Allergies   Penicillins   Review of Systems Review of Systems  Constitutional: Negative for fever.  Respiratory: Negative for shortness of breath.   Cardiovascular: Negative for chest pain.  Gastrointestinal: Positive for abdominal pain, nausea and vomiting. Negative for diarrhea.  All other systems reviewed and are negative.    Physical Exam Updated Vital Signs BP 124/78 (BP Location: Left Arm)   Pulse 82   Temp 98.7 F (37.1 C) (Oral)   Resp 18   Ht 5\' 3"  (1.6 m)   Wt 169 lb (76.7 kg)   SpO2 100%   BMI 29.94 kg/m   Physical Exam  Constitutional: She is oriented to person, place, and time. She appears well-developed and well-nourished. No distress.  HENT:  Head: Normocephalic and atraumatic.  Cardiovascular: Normal rate, regular rhythm and normal heart sounds.   Pulmonary/Chest: Effort normal and breath sounds normal. No respiratory distress. She has no wheezes.  Abdominal: Soft. Bowel sounds are normal. There is tenderness. There is no guarding.  Multiple small abdominal scars, mild tenderness to palpation of the epigastrium, no rebound or guarding  Neurological: She is alert and oriented to person, place, and time.  Skin: Skin is warm and dry.  Psychiatric: She has a normal mood and affect.  Nursing note and vitals reviewed.    ED Treatments / Results  Labs (all labs ordered are listed, but only abnormal results are displayed) Labs Reviewed  URINALYSIS, ROUTINE W REFLEX MICROSCOPIC - Abnormal;  Notable for the following:       Result Value   Hgb urine dipstick MODERATE (*)    Ketones, ur 15 (*)    All other components within normal limits  CBC WITH DIFFERENTIAL/PLATELET - Abnormal; Notable for the following:    Neutro Abs 8.8 (*)    All other components within normal limits  COMPREHENSIVE METABOLIC PANEL - Abnormal; Notable for the following:    Glucose, Bld 121 (*)    AST 100 (*)    ALT 60 (*)    All other components within normal limits  URINALYSIS, MICROSCOPIC (REFLEX) - Abnormal; Notable for the following:    Bacteria, UA MANY (*)    Squamous Epithelial / LPF 0-5 (*)    All other components within normal limits  LIPASE, BLOOD  PREGNANCY, URINE    EKG  EKG Interpretation None       Radiology Dg Abdomen Acute W/chest  Result Date: 11/09/2016 CLINICAL DATA:  Right upper quadrant pain for 3 hours. EXAM: DG ABDOMEN ACUTE W/ 1V CHEST COMPARISON:  None FINDINGS: Generous volume colonic stool. No evidence of bowel obstruction or perforation. IUD noted. No biliary or urinary calculi are evident. Upright view of the chest is negative for significant abnormality. IMPRESSION: Generous colonic stool volume. No evidence of bowel obstruction or perforation. Electronically Signed   By: Andreas Newport M.D.   On: 11/09/2016 03:26    Procedures Procedures (including critical care time)  Medications Ordered in ED Medications  pantoprazole (PROTONIX) injection 40 mg (40 mg Intravenous Given 11/09/16 0141)  ondansetron (ZOFRAN) injection 4 mg (4 mg Intravenous Given 11/09/16 0141)  gi cocktail (Maalox,Lidocaine,Donnatal) (30 mLs Oral Given 11/09/16 0350)     Initial Impression / Assessment and Plan / ED Course  I have reviewed the triage vital signs and the nursing notes.  Pertinent labs & imaging results that were available during my care of the patient were reviewed by me and considered in my medical decision making (see chart for details).  Clinical Course as of Nov 09 408    Thu Nov 09, 2016  0328 Pain somewhat improved on reevaluation. Lab work only notable for marginally elevated LFTs. Patient is status post cholecystectomy. Patient given a GI cocktail. Plain films pending.  [CH]    Clinical Course User Index [CH] Merryl Hacker, MD    She presents with upper abdominal pain. Somewhat improved with Pepcid. Consistent with prior GERD symptoms. She has had some vomiting. History of gastric sleeve. She is nontoxic. Minimal tenderness to palpation. No sick labwork obtained. She does have a mild elevation in AST and ALT. She is status post cholecystectomy. She is much improved after Protonix and a GI cocktail. Plain films just showed some moderate stool burden. Repeat exam is reassuring and she feels much better. Do not feel she needs further imaging at this time. She does need repeat LFTs by her primary physician in one week to ensure they're not trending upwards. Continue reflux medications at home. She is tolerating fluids prior to discharge.  After history, exam, and medical workup I feel the patient has been appropriately medically screened and is safe for discharge home. Pertinent diagnoses were discussed with the patient. Patient was given return precautions.   Final Clinical Impressions(s) / ED Diagnoses   Final diagnoses:  Upper abdominal pain  Transaminitis    New Prescriptions New Prescriptions   No medications on file     Merryl Hacker, MD 11/09/16 [phone]

## 2016-12-04 ENCOUNTER — Ambulatory Visit (INDEPENDENT_AMBULATORY_CARE_PROVIDER_SITE_OTHER): Payer: 59 | Admitting: Internal Medicine

## 2016-12-04 ENCOUNTER — Encounter: Payer: Self-pay | Admitting: Internal Medicine

## 2016-12-04 ENCOUNTER — Other Ambulatory Visit (INDEPENDENT_AMBULATORY_CARE_PROVIDER_SITE_OTHER): Payer: 59

## 2016-12-04 VITALS — BP 130/70 | HR 81 | Temp 97.9°F | Resp 12 | Ht 63.0 in | Wt 164.0 lb

## 2016-12-04 DIAGNOSIS — R945 Abnormal results of liver function studies: Principal | ICD-10-CM

## 2016-12-04 DIAGNOSIS — K219 Gastro-esophageal reflux disease without esophagitis: Secondary | ICD-10-CM

## 2016-12-04 DIAGNOSIS — R7989 Other specified abnormal findings of blood chemistry: Secondary | ICD-10-CM

## 2016-12-04 LAB — COMPREHENSIVE METABOLIC PANEL
ALBUMIN: 4.1 g/dL (ref 3.5–5.2)
ALK PHOS: 97 U/L (ref 39–117)
ALT: 13 U/L (ref 0–35)
AST: 14 U/L (ref 0–37)
BUN: 15 mg/dL (ref 6–23)
CALCIUM: 9.8 mg/dL (ref 8.4–10.5)
CHLORIDE: 104 meq/L (ref 96–112)
CO2: 32 mEq/L (ref 19–32)
Creatinine, Ser: 1.05 mg/dL (ref 0.40–1.20)
GFR: 71.15 mL/min (ref >60.00)
Glucose, Bld: 82 mg/dL (ref 70–99)
POTASSIUM: 4.4 meq/L (ref 3.5–5.1)
Sodium: 140 mEq/L (ref 135–145)
TOTAL PROTEIN: 7.2 g/dL (ref 6.0–8.3)
Total Bilirubin: 0.6 mg/dL (ref 0.2–1.2)

## 2016-12-04 NOTE — Progress Notes (Signed)
   Subjective:    Patient ID: Alicia Chen, female    DOB: Feb 07, 1966, 51 y.o.   MRN: [id number]  HPI The patient is a 51 YO female coming in for ER follow up (in for epigastric pain, treated for GERD with good relief, prior cholecystectomy, recent gastric sleeve with dietary indiscretion, elevated LFTs at the ER, otherwise normal labs). She has not had recurrence of the pain since the ER. She has been more careful about eating since that episode and went back to liquid diet for about 1 week after ER visit. She denies tylenol although does use rarely (3 times per year). She does not regularly drink alcohol and had 1 drink in the last several months. She denies taking any new supplements or OTC medications. She is now eating normally again and no problems. Has intermittent constipation and is using linzess prn and is going daily for the last several weeks.   PMH, [facility], social history reviewed and updated.   Review of Systems  Constitutional: Negative.   HENT: Negative.   Eyes: Negative.   Respiratory: Negative for cough, chest tightness and shortness of breath.   Cardiovascular: Negative for chest pain, palpitations and leg swelling.  Gastrointestinal: Positive for constipation. Negative for abdominal distention, abdominal pain, diarrhea, nausea and vomiting.       Doing better now  Musculoskeletal: Negative.   Skin: Negative.   Neurological: Negative.   Psychiatric/Behavioral: Negative.       Objective:   Physical Exam  Constitutional: She is oriented to person, place, and time. She appears well-developed and well-nourished.  HENT:  Head: Normocephalic and atraumatic.  Eyes: EOM are normal.  Neck: Normal range of motion.  Cardiovascular: Normal rate and regular rhythm.   Pulmonary/Chest: Effort normal and breath sounds normal. No respiratory distress. She has no wheezes. She has no rales.  Abdominal: Soft. Bowel sounds are normal. She exhibits no distension and no mass. There is no  tenderness. There is no rebound and no guarding.  Musculoskeletal: She exhibits no edema.  Neurological: She is alert and oriented to person, place, and time. Coordination normal.  Skin: Skin is warm and dry.  Psychiatric: She has a normal mood and affect.   Vitals:   12/04/16 1000  BP: 130/70  Pulse: 81  Resp: 12  Temp: 97.9 F (36.6 C)  TempSrc: Oral  SpO2: 99%  Weight: 164 lb (74.4 kg)  Height: 5\' 3"  (1.6 m)      Assessment & Plan:

## 2016-12-04 NOTE — Patient Instructions (Signed)
We will check the labs today and get you the results.

## 2016-12-04 NOTE — Assessment & Plan Note (Signed)
Unclear etiology. Past cholecystectomy making obstructive less likely as well as pattern of LFTs. Checking LFTs again today as normal in February and no known triggers with new meds, tylenol, alcohol usage, otc or herbals. If continuing to be high needs hepatitis workup. No ongoing pain but acute pain during episode.

## 2016-12-04 NOTE — Assessment & Plan Note (Signed)
Likely her GI symptoms and pain were related to GERD although no typical foods prior to symptoms. Resolved with GI cocktail. Due for EGD soon to evaluate her stomach procedure which will also evaluate GERD. Taking dexilant and pepcid daily for symptoms and has been symptom free. Diet is good and reinforced eating for GERD diet.

## 2016-12-04 NOTE — Progress Notes (Signed)
Pre visit review using our clinic review tool, if applicable. No additional management support is needed unless otherwise documented below in the visit note. 

## 2017-01-11 DIAGNOSIS — Z1211 Encounter for screening for malignant neoplasm of colon: Secondary | ICD-10-CM | POA: Diagnosis not present

## 2017-02-14 DIAGNOSIS — K317 Polyp of stomach and duodenum: Secondary | ICD-10-CM | POA: Diagnosis not present

## 2017-02-14 DIAGNOSIS — K219 Gastro-esophageal reflux disease without esophagitis: Secondary | ICD-10-CM | POA: Diagnosis not present

## 2017-02-14 DIAGNOSIS — Z9884 Bariatric surgery status: Secondary | ICD-10-CM | POA: Diagnosis not present

## 2017-02-14 DIAGNOSIS — K3189 Other diseases of stomach and duodenum: Secondary | ICD-10-CM | POA: Diagnosis not present

## 2017-02-14 DIAGNOSIS — K2950 Unspecified chronic gastritis without bleeding: Secondary | ICD-10-CM | POA: Diagnosis not present

## 2017-02-21 ENCOUNTER — Encounter: Payer: Self-pay | Admitting: Obstetrics and Gynecology

## 2017-02-21 ENCOUNTER — Ambulatory Visit: Payer: 59 | Admitting: Obstetrics and Gynecology

## 2017-02-21 VITALS — BP 122/70 | HR 72 | Resp 18 | Ht 62.0 in | Wt 157.0 lb

## 2017-02-21 DIAGNOSIS — Z01419 Encounter for gynecological examination (general) (routine) without abnormal findings: Secondary | ICD-10-CM | POA: Diagnosis not present

## 2017-02-21 DIAGNOSIS — N951 Menopausal and female climacteric states: Secondary | ICD-10-CM

## 2017-02-21 DIAGNOSIS — B372 Candidiasis of skin and nail: Secondary | ICD-10-CM

## 2017-02-21 DIAGNOSIS — Z30431 Encounter for routine checking of intrauterine contraceptive device: Secondary | ICD-10-CM | POA: Diagnosis not present

## 2017-02-21 MED ORDER — NYSTATIN 100000 UNIT/GM EX CREA: 1.0000 "application " | TOPICAL_CREAM | Freq: Two times a day (BID) | CUTANEOUS | 0 refills | Status: DC

## 2017-02-21 NOTE — Progress Notes (Signed)
51 y.o. [id number] DivorcedAfrican AmericanF here for annual exam.  She has a mirena IUD, placed in 2015. No bleeding. She is having some hot flashes and night sweats, tolerable. Up about 2 x a night with night sweats. Her sisters husband just died of a massive MI.  She is engaged, lives with her fiance. Sexually active, he has some ED so they aren't having intercourse. No vaginal pain with intercourse.  She is having some itching in her bilateral groin, she did shave and the hair is growing back in. Thinks she may have a rash. No vulvar c/o, no abnormal vaginal d/c.  S/P gastric sleeve in 2/17, she has lost over 150 lbs, feeling great. She is exercising and trying to build muscle. Her LFT's were elevated prior to her surgery, recent normal LFT's.    No LMP recorded. Patient is not currently having periods (Reason: IUD).          Sexually active: Yes.    The current method of family planning is IUD.    Exercising: Yes.    3 times a week  Smoker:  no  Health Maintenance: Pap:  02-14-16 WNL NEG HR HPV  History of abnormal Pap:  no MMG:  10-03-16 WNL  Colonoscopy:  01/2017 WNL per patient  BMD:   Never TDaP:  2009  Gardasil: NA   reports that she has never smoked. She has never used smokeless tobacco. She reports that she drinks alcohol. She reports that she does not use drugs. She has twin girls, 59, they are out of the house. Both work and go to school. 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 No ETOH.   Past Medical History:  Diagnosis Date  . ALLERGIC RHINITIS   . ANEMIA-NOS   . ARTHRITIS   . ASTHMA   . DEPRESSION   . Fibroid   . GERD   . MIGRAINE HEADACHE   . OBESITY, MORBID   . Vitamin D deficiency     Past Surgical History:  Procedure Laterality Date  . CHOLECYSTECTOMY  2009  . ESOPHAGOGASTRODUODENOSCOPY N/A 12/23/2012   Procedure: ESOPHAGOGASTRODUODENOSCOPY (EGD);  Surgeon: Juanita Craver, MD;  Location: [facility];  Service: Endoscopy;   Laterality: N/A;  . INTRAUTERINE DEVICE (IUD) INSERTION  12-15-13   Mirena  . [place] RESECTION  2/17  . Skin graft surgery (R) leg     childhood    Current Outpatient Prescriptions  Medication Sig Dispense Refill  . albuterol (VENTOLIN HFA) 108 (90 BASE) MCG/ACT inhaler Inhale 1-2 puffs into the lungs every 4 (four) hours as needed for wheezing or shortness of breath. 18 g 1  . ALPRAZolam (XANAX) 0.5 MG tablet Take 1 tablet (0.5 mg total) by mouth daily as needed for anxiety. 30 tablet 0  . calcium carbonate (TUMS - DOSED IN MG ELEMENTAL CALCIUM) 500 MG chewable tablet Chew by mouth.    . Cholecalciferol (VITAMIN D) 2000 units tablet Take 2,000 Units by mouth daily.    Marland Kitchen dexlansoprazole (DEXILANT) 60 MG capsule Take 1 capsule (60 mg total) by mouth daily. 90 capsule 3  . famotidine (PEPCID) 20 MG tablet Take 1 tablet (20 mg total) by mouth 2 (two) times daily as needed for heartburn or indigestion. 180 tablet 3  . fluticasone (FLONASE) 50 MCG/ACT nasal spray Place 2 sprays into both nostrils as needed for allergies. 48 g 3  . linaclotide (LINZESS) 145 MCG CAPS capsule Take 1 capsule (145 mcg total) by  mouth daily. 90 capsule 3  . loratadine (CLARITIN) 10 MG tablet Take 1 tablet (10 mg total) by mouth daily. 90 tablet 3  . magnesium hydroxide (MILK OF MAGNESIA) 400 MG/5ML suspension Take by mouth.    . meclizine (ANTIVERT) 25 MG tablet Take 1 tablet (25 mg total) by mouth 3 (three) times daily as needed. 30 tablet 0  . Multiple Vitamin (MULTIVITAMIN) tablet Take 1 tablet by mouth daily.     No current facility-administered medications for this visit.     Family History  Problem Relation Age of Onset  . Arthritis Mother   . Arthritis Father   . Alcohol abuse Other        Parents  . Arthritis Other        grandparents  . Diabetes Other        Parent & grandparent  . Hyperlipidemia Other        Parent, grandparent, other relative  . Hypertension Other         parent, grandparent, other relative  . Prostate cancer Other        grandfather  . Breast cancer Sister   . Breast cancer Sister     Review of Systems  Constitutional: Negative.   HENT: Negative.   Eyes: Negative.   Respiratory: Negative.   Cardiovascular: Negative.   Gastrointestinal: Negative.   Endocrine: Negative.   Genitourinary:       Vaginal itching  Hot flashes  Musculoskeletal: Negative.   Skin: Negative.   Allergic/Immunologic: Negative.   Neurological: Negative.   Psychiatric/Behavioral: Negative.     Exam:   BP 122/70 (BP Location: Right Arm, Patient Position: Sitting, Cuff Size: Normal)   Pulse 72   Resp 18   Ht 5\' 2"  (1.575 m)   Wt 157 lb (71.2 kg)   BMI 28.72 kg/m   Weight change: @WEIGHTCHANGE @ Height:   Height: 5\' 2"  (157.5 cm)  Ht Readings from Last 3 Encounters:  02/21/17 5\' 2"  (1.575 m)  12/04/16 5\' 3"  (1.6 m)  11/09/16 5\' 3"  (1.6 m)    General appearance: alert, cooperative and appears stated age Head: Normocephalic, without obvious abnormality, atraumatic Neck: no adenopathy, supple, symmetrical, trachea midline and thyroid normal to inspection and palpation Lungs: clear to auscultation bilaterally Cardiovascular: regular rate and rhythm Breasts: normal appearance, no masses or tenderness Abdomen: soft, non-tender; bowel sounds normal; no masses,  no organomegaly Extremities: extremities normal, atraumatic, no cyanosis or edema Skin: Skin color, texture, turgor normal. Bilateral groin with a patchy rash, c/w candida intertrigo Lymph nodes: Cervical, supraclavicular, and axillary nodes normal. No abnormal inguinal nodes palpated Neurologic: Grossly normal   Pelvic: External genitalia:  no lesions              Urethra:  normal appearing urethra with no masses, tenderness or lesions              Bartholins and Skenes: normal                 Vagina: normal appearing vagina with normal color and discharge, no lesions              Cervix: no  lesions and IUD string 3 cm               Bimanual Exam:  Uterus:  normal size, contour, position, consistency, mobility, non-tender and retroverted              Adnexa: no mass, fullness, tenderness  Rectovaginal: Confirms               Anus:  normal sphincter tone, no lesions  Chaperone was present for exam.  A:  Well Woman with normal exam  IUD check  Vasomotor symptoms are tolerable  Candida intertrigo  P:   No pap this year  Discussed breast self exam  Discussed calcium and vit D intake  Mammogram and colonoscopy are UTD  Mirena due for removal in 2020  Nystatin

## 2017-02-21 NOTE — Patient Instructions (Addendum)

## 2017-02-27 DIAGNOSIS — Z9884 Bariatric surgery status: Secondary | ICD-10-CM | POA: Diagnosis not present

## 2017-02-27 DIAGNOSIS — K909 Intestinal malabsorption, unspecified: Secondary | ICD-10-CM | POA: Diagnosis not present

## 2017-02-27 DIAGNOSIS — K317 Polyp of stomach and duodenum: Secondary | ICD-10-CM | POA: Diagnosis not present

## 2017-04-05 DIAGNOSIS — Z0100 Encounter for examination of eyes and vision without abnormal findings: Secondary | ICD-10-CM | POA: Diagnosis not present

## 2017-05-08 ENCOUNTER — Other Ambulatory Visit: Payer: Self-pay | Admitting: Internal Medicine

## 2017-05-09 NOTE — Telephone Encounter (Signed)
Faxed to [facility] on [address]

## 2017-06-15 ENCOUNTER — Telehealth: Payer: Self-pay | Admitting: Obstetrics and Gynecology

## 2017-06-15 NOTE — Telephone Encounter (Addendum)
Spoke with patient. Reports dime size "bump/boil" under left breast, noticed earlier in week. Sore, tender to touch and draining red/green/brown d/c. Warm to touch earlier in week. Has been applying alcohol and warm compresses.   Report vaginal /groin itching and "scaly" rash. IUD for contraception, noticed spot of blood last week with odor.   Denies fever/chills, pain, N/V.  Recommended OV for further evaluation, advised patient Dr. Talbert Nan is out of the office today, can schedule with covering provider. Patient declined, scheduled for 11/12 at 11:45am with Dr. Talbert Nan.   Advised patient covering provider will review with, will return call with any additional recommendations, patient is agreeable.   Routing to provider for final review. Patient is agreeable to disposition. Will close encounter.  Cc: Dr. Talbert Nan

## 2017-06-15 NOTE — Telephone Encounter (Signed)
Patient has a boil under her left breast that is sore and oozing fluid. She is also experiencing some groin itching.

## 2017-06-18 ENCOUNTER — Encounter: Payer: Self-pay | Admitting: Obstetrics and Gynecology

## 2017-06-18 ENCOUNTER — Ambulatory Visit: Payer: 59 | Admitting: Obstetrics and Gynecology

## 2017-06-18 VITALS — BP 124/88 | HR 64 | Resp 14 | Wt 161.0 lb

## 2017-06-18 DIAGNOSIS — N760 Acute vaginitis: Secondary | ICD-10-CM

## 2017-06-18 DIAGNOSIS — L0292 Furuncle, unspecified: Secondary | ICD-10-CM

## 2017-06-18 MED ORDER — BETAMETHASONE VALERATE 0.1 % EX OINT: TOPICAL_OINTMENT | CUTANEOUS | 0 refills | Status: DC

## 2017-06-18 NOTE — Progress Notes (Signed)
GYNECOLOGY  VISIT   HPI: 51 y.o.   Divorced  [place] American  female   [phone] with No LMP recorded. Patient is not currently having periods (Reason: IUD).   here c/o vaginal itching. She is also c/o of a bump that burst on her left breast. It started last week, very tender, she started warm compresses, it popped on Friday. Feeling better now. Still there.  She c/o a one week h/o vulvar itching. No abnormal d/c, maybe some slight white d/c over the weekend. The itching is currently less intense, but still present.   GYNECOLOGIC HISTORY: No LMP recorded. Patient is not currently having periods (Reason: IUD). Contraception:IUD (Mirena) Menopausal hormone therapy: none         OB History    Gravida Para Term Preterm AB Living   2 2 1 1   2    SAB TAB Ectopic Multiple Live Births         2 4         Patient Active Problem List   Diagnosis Date Noted  . Elevated LFTs 12/04/2016  . Routine general medical examination at a health care facility 07/11/2016  . Trigger thumb of left hand 04/05/2015  . OSA (obstructive sleep apnea) 02/05/2013  . Obesity 09/26/2010  . ANEMIA-NOS 02/09/2010  . DEPRESSION 02/09/2010  . MIGRAINE HEADACHE 02/09/2010  . ALLERGIC RHINITIS 02/09/2010  . ASTHMA 02/09/2010  . GERD 02/09/2010  . ARTHRITIS 02/09/2010    Past Medical History:  Diagnosis Date  . ALLERGIC RHINITIS   . ANEMIA-NOS   . ARTHRITIS   . ASTHMA   . DEPRESSION   . Fibroid   . GERD   . MIGRAINE HEADACHE   . OBESITY, MORBID   . Vitamin D deficiency     Past Surgical History:  Procedure Laterality Date  . CHOLECYSTECTOMY  2009  . INTRAUTERINE DEVICE (IUD) INSERTION  12-15-13   Mirena  . [place] RESECTION  2/17  . Skin graft surgery (R) leg     childhood    Current Outpatient Medications  Medication Sig Dispense Refill  . albuterol (VENTOLIN HFA) 108 (90 BASE) MCG/ACT inhaler Inhale 1-2 puffs into the lungs every 4 (four) hours as needed for wheezing or  shortness of breath. 18 g 1  . ALPRAZolam (XANAX) 0.5 MG tablet TAKE 1 TABLET BY MOUTH DAILY AS NEEDED FOR ANXIETY 30 tablet 0  . calcium carbonate (TUMS - DOSED IN MG ELEMENTAL CALCIUM) 500 MG chewable tablet Chew by mouth.    . Cholecalciferol (VITAMIN D) 2000 units tablet Take 2,000 Units by mouth daily.    Marland Kitchen dexlansoprazole (DEXILANT) 60 MG capsule Take 1 capsule (60 mg total) by mouth daily. 90 capsule 3  . famotidine (PEPCID) 20 MG tablet Take 1 tablet (20 mg total) by mouth 2 (two) times daily as needed for heartburn or indigestion. 180 tablet 3  . fluticasone (FLONASE) 50 MCG/ACT nasal spray Place 2 sprays into both nostrils as needed for allergies. 48 g 3  . linaclotide (LINZESS) 145 MCG CAPS capsule Take 1 capsule (145 mcg total) by mouth daily. 90 capsule 3  . loratadine (CLARITIN) 10 MG tablet Take 1 tablet (10 mg total) by mouth daily. 90 tablet 3  . magnesium hydroxide (MILK OF MAGNESIA) 400 MG/5ML suspension Take by mouth.    . meclizine (ANTIVERT) 25 MG tablet Take 1 tablet (25 mg total) by mouth 3 (three) times daily as needed. 30 tablet 0  . Multiple Vitamin (MULTIVITAMIN) tablet Take  1 tablet by mouth daily.    Marland Kitchen nystatin cream (MYCOSTATIN) Apply 1 application topically 2 (two) times daily. Apply to affected area BID for up to 7 days. 30 g 0   No current facility-administered medications for this visit.      ALLERGIES: Penicillins  Family History  Problem Relation Age of Onset  . Arthritis Mother   . Arthritis Father   . Alcohol abuse Other        Parents  . Arthritis Other        grandparents  . Diabetes Other        Parent & grandparent  . Hyperlipidemia Other        Parent, grandparent, other relative  . Hypertension Other        parent, grandparent, other relative  . Prostate cancer Other        grandfather  . Breast cancer Sister   . Breast cancer Sister     Social History   Socioeconomic History  . Marital status: Divorced    Spouse name: Not on  file  . Number of children: Not on file  . Years of education: Not on file  . Highest education level: Not on file  Social Needs  . Financial resource strain: Not on file  . Food insecurity - worry: Not on file  . Food insecurity - inability: Not on file  . Transportation needs - medical: Not on file  . Transportation needs - non-medical: Not on file  Occupational History  . Occupation: [employer]    Employer: [employer]  Tobacco Use  . Smoking status: Never Smoker  . Smokeless tobacco: Never Used  Substance and Sexual Activity  . Alcohol use: Yes    Comment: Social  . Drug use: No  . Sexual activity: Yes    Partners: Male    Birth control/protection: Condom, IUD  Other Topics Concern  . 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   Eyes: Negative.   Respiratory: Negative.   Cardiovascular: Negative.   Gastrointestinal: Negative.   Genitourinary:       Vaginal itching   Musculoskeletal: Negative.   Skin: Negative.        Left breast bump   Neurological: Negative.   Endo/Heme/Allergies: Negative.   Psychiatric/Behavioral: Negative.     PHYSICAL EXAMINATION:    BP 124/88 (BP Location: Right Arm, Patient Position: Sitting, Cuff Size: Normal)   Pulse 64   Resp 14   Wt 161 lb (73 kg)   BMI 29.45 kg/m     General appearance: alert, cooperative and appears stated age Breasts: draining boil at 7-8 o'clock on the left breast near the periphery, approximately 1 cm, no surrounding cellulitis, minimal induration  Pelvic: External genitalia:  no lesions              Urethra:  normal appearing urethra with no masses, tenderness or lesions              Bartholins and Skenes: normal                 Vagina: normal appearing vagina with mild atrophy, no discharge, no lesions              Cervix: no lesions, IUD string 3 cm  Chaperone was present for exam.  Wet prep: ? clue, no trich, + wbc, +parabasilar cells KOH: no yeast PH: 5.5   ASSESSMENT Boil left breast, draining Vulvovaginitis, slides not clear, ? Clue, some signs of atrophy    PLAN Continue with warm compresses to the boil, call if not continuing to improve Affirm sent Steroid ointment  Discussed use of Vaseline as well   An After Visit Summary was printed and given to the patient.

## 2017-06-18 NOTE — Patient Instructions (Signed)

## 2017-06-20 ENCOUNTER — Telehealth: Payer: Self-pay | Admitting: *Deleted

## 2017-06-20 LAB — VAGINITIS/VAGINOSIS, DNA PROBE
CANDIDA SPECIES: NEGATIVE
Gardnerella vaginalis: POSITIVE — AB
TRICHOMONAS VAG: NEGATIVE

## 2017-06-20 MED ORDER — METRONIDAZOLE 500 MG PO TABS: 500.0000 mg | ORAL_TABLET | Freq: Two times a day (BID) | ORAL | 0 refills | Status: AC

## 2017-06-20 NOTE — Telephone Encounter (Signed)
Spoke with patient and gave results. RX sent into pharmacy. Advised to avoid alcohol while taking the Flagyl. Patient voiced understanding -eh

## 2017-06-20 NOTE — Telephone Encounter (Signed)
-----   Message from Salvadore Dom, MD sent at 06/20/2017  4:09 PM EST ----- Please inform the patient that her vaginitis probe was + for BV and treat with flagyl (either oral or vaginal, her choice), no ETOH while on Flagyl.  Oral: Flagyl 500 mg BID x 7 days, or Vaginal: Metrogel, 1 applicator per vagina q day x 5 days.

## 2017-07-02 ENCOUNTER — Ambulatory Visit: Payer: 59 | Admitting: Internal Medicine

## 2017-07-02 ENCOUNTER — Encounter: Payer: Self-pay | Admitting: Internal Medicine

## 2017-07-02 DIAGNOSIS — J4520 Mild intermittent asthma, uncomplicated: Secondary | ICD-10-CM

## 2017-07-02 MED ORDER — BENZONATATE 200 MG PO CAPS: 200.0000 mg | ORAL_CAPSULE | Freq: Two times a day (BID) | ORAL | 0 refills | Status: DC | PRN

## 2017-07-02 MED ORDER — LORATADINE 10 MG PO TABS: 10.0000 mg | ORAL_TABLET | Freq: Every day | ORAL | 3 refills | Status: DC

## 2017-07-02 MED ORDER — ALBUTEROL SULFATE HFA 108 (90 BASE) MCG/ACT IN AERS: 1.0000 | INHALATION_SPRAY | RESPIRATORY_TRACT | 1 refills | Status: DC | PRN

## 2017-07-02 MED ORDER — FLUTICASONE PROPIONATE 50 MCG/ACT NA SUSP: 2.0000 | NASAL | 3 refills | Status: DC | PRN

## 2017-07-02 NOTE — Progress Notes (Signed)
   Subjective:    Patient ID: Alicia Chen, female    DOB: 12/22/1965, 51 y.o.   MRN: [id number]  HPI The patient is a 51 YO female coming in for sore throat, cough, chills. Going on for about 3-4 days.Overall stable to mild worsening. Has not tried anything over the counter. She does have asthma but has not used her albuterol and denies SOB. She does not know if she has an albuterol inhaler. Supposed to be using claritin and flonase during allergy season but she is out of those as well. Multiple sick contacts.  Review of Systems  Constitutional: Positive for chills. Negative for activity change, appetite change, fatigue, fever and unexpected weight change.  HENT: Positive for congestion, ear pain, postnasal drip, rhinorrhea and sinus pressure. Negative for ear discharge, sinus pain, sneezing, sore throat, tinnitus, trouble swallowing and voice change.   Eyes: Negative.   Respiratory: Positive for cough. Negative for chest tightness, shortness of breath and wheezing.   Cardiovascular: Negative.   Gastrointestinal: Negative.   Musculoskeletal: Negative for myalgias.  Neurological: Negative.       Objective:   Physical Exam  Constitutional: She is oriented to person, place, and time. She appears well-developed and well-nourished. No distress.  HENT:  Head: Normocephalic and atraumatic.  Oropharynx with redness and clear drainage, nose with swollen turbinates, TMs normal bilaterally  Eyes: EOM are normal.  Neck: Normal range of motion. No thyromegaly present.  Cardiovascular: Normal rate and regular rhythm.  Pulmonary/Chest: Effort normal and breath sounds normal. No respiratory distress. She has no wheezes. She has no rales.  Abdominal: Soft.  Lymphadenopathy:    She has no cervical adenopathy.  Neurological: She is alert and oriented to person, place, and time.  Skin: Skin is warm and dry.   Vitals:   07/02/17 1600  BP: 134/82  Pulse: 98  Temp: 98.6 F (37 C)  TempSrc: Oral    SpO2: 99%  Weight: 160 lb 4 oz (72.7 kg)  Height: 5\' 2"  (1.575 m)      Assessment & Plan:

## 2017-07-02 NOTE — Patient Instructions (Signed)
We have sent in the refills of the albuterol and the claritin and the flonase.  Use the claritin and flonase to use  Daily.  We have sent in tessalon perles to help the cough up to 3 times per day.

## 2017-07-02 NOTE — Assessment & Plan Note (Signed)
No flare today but needs refill of albuterol and encouraged to restart claritin and flonase which are also refilled today. Rx for tessalon perles for cough. Likely viral in etiology and if no resolution in 1 week call back or sooner with SOB.

## 2017-07-06 ENCOUNTER — Encounter: Payer: Self-pay | Admitting: Family

## 2017-07-06 ENCOUNTER — Ambulatory Visit: Payer: 59 | Admitting: Family

## 2017-07-06 VITALS — BP 128/74 | HR 86 | Temp 98.4°F | Ht 62.0 in | Wt 158.0 lb

## 2017-07-06 DIAGNOSIS — J45909 Unspecified asthma, uncomplicated: Secondary | ICD-10-CM | POA: Diagnosis not present

## 2017-07-06 MED ORDER — BUDESONIDE-FORMOTEROL FUMARATE 160-4.5 MCG/ACT IN AERO: 2.0000 | INHALATION_SPRAY | Freq: Two times a day (BID) | RESPIRATORY_TRACT | 0 refills | Status: DC

## 2017-07-06 MED ORDER — AZITHROMYCIN 250 MG PO TABS: ORAL_TABLET | ORAL | 0 refills | Status: DC

## 2017-07-06 NOTE — Progress Notes (Signed)
Alicia Chen is a 51 y.o. female with the following history as recorded in EpicCare:  Patient Active Problem List   Diagnosis Date Noted  . Elevated LFTs 12/04/2016  . Routine general medical examination at a health care facility 07/11/2016  . Trigger thumb of left hand 04/05/2015  . OSA (obstructive sleep apnea) 02/05/2013  . Obesity 09/26/2010  . ANEMIA-NOS 02/09/2010  . DEPRESSION 02/09/2010  . MIGRAINE HEADACHE 02/09/2010  . ALLERGIC RHINITIS 02/09/2010  . Asthma 02/09/2010  . GERD 02/09/2010  . ARTHRITIS 02/09/2010    Current Outpatient Medications  Medication Sig Dispense Refill  . albuterol (VENTOLIN HFA) 108 (90 Base) MCG/ACT inhaler Inhale 1-2 puffs into the lungs every 4 (four) hours as needed for wheezing or shortness of breath. 18 g 1  . ALPRAZolam (XANAX) 0.5 MG tablet TAKE 1 TABLET BY MOUTH DAILY AS NEEDED FOR ANXIETY 30 tablet 0  . benzonatate (TESSALON) 200 MG capsule Take 1 capsule (200 mg total) by mouth 2 (two) times daily as needed for cough. 45 capsule 0  . betamethasone valerate ointment (VALISONE) 0.1 % Apply a pea sized amount BID x 1-2 weeks as needed 15 g 0  . calcium carbonate (TUMS - DOSED IN MG ELEMENTAL CALCIUM) 500 MG chewable tablet Chew by mouth.    . Cholecalciferol (VITAMIN D) 2000 units tablet Take 2,000 Units by mouth daily.    Marland Kitchen dexlansoprazole (DEXILANT) 60 MG capsule Take 1 capsule (60 mg total) by mouth daily. 90 capsule 3  . famotidine (PEPCID) 20 MG tablet Take 1 tablet (20 mg total) by mouth 2 (two) times daily as needed for heartburn or indigestion. 180 tablet 3  . fluticasone (FLONASE) 50 MCG/ACT nasal spray Place 2 sprays into both nostrils as needed for allergies. 48 g 3  . levonorgestrel (MIRENA) 20 MCG/24HR IUD 1 each once by Intrauterine route.    . linaclotide (LINZESS) 145 MCG CAPS capsule Take 1 capsule by mouth 2 (two) times daily.    Marland Kitchen loratadine (CLARITIN) 10 MG tablet Take 1 tablet (10 mg total) by mouth daily. 90 tablet 3  .  magnesium hydroxide (MILK OF MAGNESIA) 400 MG/5ML suspension Take by mouth.    . meclizine (ANTIVERT) 25 MG tablet Take 1 tablet (25 mg total) by mouth 3 (three) times daily as needed. 30 tablet 0  . Multiple Vitamin (MULTIVITAMIN) tablet Take 1 tablet by mouth daily.    Marland Kitchen nystatin cream (MYCOSTATIN) Apply 1 application topically 2 (two) times daily. Apply to affected area BID for up to 7 days. 30 g 0  . azithromycin (ZITHROMAX) 250 MG tablet Take 2 tablets today; then 1 tablet daily x 4 days 6 tablet 0  . budesonide-formoterol (SYMBICORT) 160-4.5 MCG/ACT inhaler Inhale 2 puffs into the lungs 2 (two) times daily. 1 Inhaler 0   No current facility-administered medications for this visit.     Allergies: Penicillins  Past Medical History:  Diagnosis Date  . ALLERGIC RHINITIS   . ANEMIA-NOS   . ARTHRITIS   . ASTHMA   . DEPRESSION   . Fibroid   . GERD   . MIGRAINE HEADACHE   . OBESITY, MORBID   . Vitamin D deficiency     Past Surgical History:  Procedure Laterality Date  . CHOLECYSTECTOMY  2009  . ESOPHAGOGASTRODUODENOSCOPY N/A 12/23/2012   Procedure: ESOPHAGOGASTRODUODENOSCOPY (EGD);  Surgeon: Juanita Craver, MD;  Location: [facility];  Service: Endoscopy;  Laterality: N/A;  . INTRAUTERINE DEVICE (IUD) INSERTION  12-15-13   Mirena  .  [place] RESECTION  2/17  . Skin graft surgery (R) leg     childhood    Family History  Problem Relation Age of Onset  . Arthritis Mother   . Arthritis Father   . Alcohol abuse Other        Parents  . Arthritis Other        grandparents  . Diabetes Other        Parent & grandparent  . Hyperlipidemia Other        Parent, grandparent, other relative  . Hypertension Other        parent, grandparent, other relative  . Prostate cancer Other        grandfather  . Breast cancer Sister   . Breast cancer Sister     Social History   Tobacco Use  . Smoking status: Never Smoker  . Smokeless tobacco: Never Used  Substance Use  Topics  . Alcohol use: Yes    Comment: Social    Subjective:  Patient was seen earlier this week with suspected viral URI; she was told to follow-up if symptoms persisted/ worsened. She has been having to use her rescue inhaler more frequently in the past few days and is concerned that her asthma is aggravating the issue. She denies any chest pain or difficulty breathing. She denies any fever. She is getting minimal relief with the [employer]    Objective:  Vitals:   07/06/17 0904  BP: 128/74  Pulse: 86  Temp: 98.4 F (36.9 C)  TempSrc: Oral  SpO2: 100%  Weight: 158 lb (71.7 kg)  Height: 5\' 2"  (1.575 m)    General: Well developed, well nourished, in no acute distress  Skin : Warm and dry.  Head: Normocephalic and atraumatic  Eyes: Sclera and conjunctiva clear; pupils round and reactive to light; extraocular movements intact  Ears: External normal; canals clear; tympanic membranes normal  Oropharynx: Pink, supple. No suspicious lesions  Neck: Supple without thyromegaly, adenopathy  Lungs: Respirations unlabored; coarse sounds noted in upper lobes; no crackles noted; CVS exam: normal rate, regular rhythm.  Neurologic: Alert and oriented; speech intact; face symmetrical; moves all extremities well; CNII-XII intact without focal deficit  Assessment:  1. Acute asthmatic bronchitis     Plan:  Rx for Z-pak; Rx for Symbicort 160/4.5 2 puffs bid x 10-14 days; continue albuterol and Tessalon Perles as needed; increase fluids, rest; follow-up worse, no better; if symptoms persist, will need to consider CXR.   No Follow-up on file.  No orders of the defined types were placed in this encounter.   Requested Prescriptions   Signed Prescriptions Disp Refills  . budesonide-formoterol (SYMBICORT) 160-4.5 MCG/ACT inhaler 1 Inhaler 0    Sig: Inhale 2 puffs into the lungs 2 (two) times daily.  Marland Kitchen azithromycin (ZITHROMAX) 250 MG tablet 6 tablet 0    Sig: Take 2 tablets today; then 1  tablet daily x 4 days

## 2017-07-21 ENCOUNTER — Encounter: Payer: Self-pay | Admitting: Family Medicine

## 2017-07-21 ENCOUNTER — Ambulatory Visit: Payer: 59 | Admitting: Family Medicine

## 2017-07-21 VITALS — BP 104/80 | HR 86 | Temp 98.3°F | Resp 16 | Wt 162.0 lb

## 2017-07-21 DIAGNOSIS — R05 Cough: Secondary | ICD-10-CM | POA: Diagnosis not present

## 2017-07-21 DIAGNOSIS — R052 Subacute cough: Secondary | ICD-10-CM

## 2017-07-21 DIAGNOSIS — J4520 Mild intermittent asthma, uncomplicated: Secondary | ICD-10-CM | POA: Diagnosis not present

## 2017-07-21 NOTE — Patient Instructions (Signed)
Lets have you go back on symbicort for 2 more weeks. Continue mucinex DM  If you are not improving in next 7-14 days, drop by the basement here for a chest x-ray. Do not need an appointment  Please go to [facility] - located [address] across the [address] from [place] - in the basement - Hours: 8:30-5:30 PM M-F. Do not need appointment.   If you have worsening symptoms please return to see [place]. I suspect this is just lingering post viral cough from your bronchitis. Did consider prednisone but lets hold off and try this first- I can always call this in for you later if needed but would use dexilant for 10 days over the pepcid then return to pepcid after that

## 2017-07-21 NOTE — Progress Notes (Signed)
[facility]  Subjective:  Alicia Chen is a 51 y.o. year old very pleasant female patient who presents for/with See problem oriented charting ROS- no fever, chills. Minimal wheeze- does have some chest tightness particularly with coughing fits   Past Medical History-  Patient Active Problem List   Diagnosis Date Noted  . Elevated LFTs 12/04/2016  . Routine general medical examination at a health care facility 07/11/2016  . Trigger thumb of left hand 04/05/2015  . OSA (obstructive sleep apnea) 02/05/2013  . Obesity 09/26/2010  . ANEMIA-NOS 02/09/2010  . DEPRESSION 02/09/2010  . MIGRAINE HEADACHE 02/09/2010  . ALLERGIC RHINITIS 02/09/2010  . Asthma 02/09/2010  . GERD 02/09/2010  . ARTHRITIS 02/09/2010    Medications- reviewed and updated Current Outpatient Medications  Medication Sig Dispense Refill  . albuterol (VENTOLIN HFA) 108 (90 Base) MCG/ACT inhaler Inhale 1-2 puffs into the lungs every 4 (four) hours as needed for wheezing or shortness of breath. 18 g 1  . ALPRAZolam (XANAX) 0.5 MG tablet TAKE 1 TABLET BY MOUTH DAILY AS NEEDED FOR ANXIETY 30 tablet 0  . betamethasone valerate ointment (VALISONE) 0.1 % Apply a pea sized amount BID x 1-2 weeks as needed 15 g 0  . budesonide-formoterol (SYMBICORT) 160-4.5 MCG/ACT inhaler Inhale 2 puffs into the lungs 2 (two) times daily. 1 Inhaler 0  . calcium carbonate (TUMS - DOSED IN MG ELEMENTAL CALCIUM) 500 MG chewable tablet Chew by mouth.    . Cholecalciferol (VITAMIN D) 2000 units tablet Take 2,000 Units by mouth daily.    Marland Kitchen dexlansoprazole (DEXILANT) 60 MG capsule Take 1 capsule (60 mg total) by mouth daily. 90 capsule 3  . famotidine (PEPCID) 20 MG tablet Take 1 tablet (20 mg total) by mouth 2 (two) times daily as needed for heartburn or indigestion. 180 tablet 3  . fluticasone (FLONASE) 50 MCG/ACT nasal spray Place 2 sprays into both nostrils as needed for allergies. 48 g 3  . levonorgestrel (MIRENA) 20 MCG/24HR IUD 1 each  once by Intrauterine route.    . linaclotide (LINZESS) 145 MCG CAPS capsule Take 1 capsule by mouth 2 (two) times daily.    Marland Kitchen loratadine (CLARITIN) 10 MG tablet Take 1 tablet (10 mg total) by mouth daily. 90 tablet 3  . magnesium hydroxide (MILK OF MAGNESIA) 400 MG/5ML suspension Take by mouth.    . meclizine (ANTIVERT) 25 MG tablet Take 1 tablet (25 mg total) by mouth 3 (three) times daily as needed. 30 tablet 0  . Multiple Vitamin (MULTIVITAMIN) tablet Take 1 tablet by mouth daily.    Marland Kitchen nystatin cream (MYCOSTATIN) Apply 1 application topically 2 (two) times daily. Apply to affected area BID for up to 7 days. 30 g 0   No current facility-administered medications for this visit.     Objective: BP 104/80   Pulse 86   Temp 98.3 F (36.8 C) (Oral)   Resp 16   Wt 162 lb (73.5 kg)   SpO2 97%   BMI 29.63 kg/m  Gen: NAD, resting comfortably TM norma, oropharynx normal CV: RRR no murmurs rubs or gallops Lungs: CTAB no crackles, wheeze, rhonchi. Noted intermittent hacking cough Abdomen: soft/nontender/nondistended/normal bowel sounds. No rebound or guarding.  Ext: no edema Skin: warm, dry  Assessment/Plan:  Subacute cough - Plan: DG Chest 2 View  Mild intermittent asthma without complication S: Patient seen about a month ago and diagnosed with URI. Followed up a few days later with worsening symptoms and diagnosed with bronchitis- treated with symbicort  for 7 days and azithromycin. She improved at least 50-75% but never fully resolved then over last week or so symptoms have worsened- dry hacking cough causing some chest tightness, minimal wheeze. No fever, chills, shortness of breath.  A/P: This could be lingering post viral cough from bronchitis. No wheeze on exam to suggest asthma flare today but asthma likely makes it more difficult for cough to resolve- did well with symbicort so will trial 2 weeks instead of 1 week (instruction said 2 weeks but she states she heard one week last time).  Due to gastric sleeve, she has wanted to avoid prednisone though we discussed if not improving or worsening would have to consider- would have her take her dexilant for a week then return to her pepcid if that happens. Also wrote for x-ray for her which she can have done if not improving this week- would probably do that before the prednisone Patient Instructions  Lets have you go back on symbicort for 2 more weeks. Continue mucinex DM  If you are not improving in next 7-14 days, drop by the basement here for a chest x-ray. Do not need an appointment  Please go to [facility] - located [address] across the street from [place] - in the basement - Hours: 8:30-5:30 PM M-F. Do not need appointment.   If you have worsening symptoms please return to see [place]. I suspect this is just lingering post viral cough from your bronchitis. Did consider prednisone but lets hold off and try this first- I can always call this in for you later if needed but would use dexilant for 10 days over the pepcid then return to pepcid after that   Future Appointments  Date Time Provider [place]  02/27/2018  8:00 AM Salvadore Dom, MD [place] None   Orders Placed This Encounter  Procedures  . DG Chest 2 View    Standing Status:   Future    Standing Expiration Date:   09/21/2018    Order Specific Question:   Reason for Exam (SYMPTOM  OR DIAGNOSIS REQUIRED)    Answer:   cough for 1 month. suspect lingering bronchitis. does have asthma. rule out pneumonia    Order Specific Question:   Preferred imaging location?    Answer:   [facility]    Order Specific Question:   Is patient pregnant?    Answer:   No    Comments:   IUD in place   Return precautions advised.  Garret Reddish, MD

## 2017-08-14 DIAGNOSIS — K219 Gastro-esophageal reflux disease without esophagitis: Secondary | ICD-10-CM | POA: Diagnosis not present

## 2017-08-14 DIAGNOSIS — K5900 Constipation, unspecified: Secondary | ICD-10-CM | POA: Diagnosis not present

## 2017-08-29 DIAGNOSIS — Z903 Acquired absence of stomach [part of]: Secondary | ICD-10-CM | POA: Diagnosis not present

## 2017-08-29 DIAGNOSIS — K912 Postsurgical malabsorption, not elsewhere classified: Secondary | ICD-10-CM | POA: Diagnosis not present

## 2017-08-31 DIAGNOSIS — M85852 Other specified disorders of bone density and structure, left thigh: Secondary | ICD-10-CM | POA: Diagnosis not present

## 2017-08-31 DIAGNOSIS — K912 Postsurgical malabsorption, not elsewhere classified: Secondary | ICD-10-CM | POA: Diagnosis not present

## 2017-08-31 DIAGNOSIS — Z903 Acquired absence of stomach [part of]: Secondary | ICD-10-CM | POA: Diagnosis not present

## 2017-09-13 DIAGNOSIS — K317 Polyp of stomach and duodenum: Secondary | ICD-10-CM | POA: Diagnosis not present

## 2017-09-13 DIAGNOSIS — Z903 Acquired absence of stomach [part of]: Secondary | ICD-10-CM | POA: Diagnosis not present

## 2017-09-13 DIAGNOSIS — K912 Postsurgical malabsorption, not elsewhere classified: Secondary | ICD-10-CM | POA: Diagnosis not present

## 2017-10-11 ENCOUNTER — Other Ambulatory Visit: Payer: Self-pay | Admitting: Internal Medicine

## 2017-10-11 ENCOUNTER — Ambulatory Visit: Payer: 59 | Admitting: Internal Medicine

## 2017-10-11 ENCOUNTER — Encounter: Payer: Self-pay | Admitting: Internal Medicine

## 2017-10-11 DIAGNOSIS — B9789 Other viral agents as the cause of diseases classified elsewhere: Secondary | ICD-10-CM

## 2017-10-11 DIAGNOSIS — J069 Acute upper respiratory infection, unspecified: Secondary | ICD-10-CM | POA: Diagnosis not present

## 2017-10-11 DIAGNOSIS — Z1231 Encounter for screening mammogram for malignant neoplasm of breast: Secondary | ICD-10-CM

## 2017-10-11 MED ORDER — BENZONATATE 200 MG PO CAPS: 200.0000 mg | ORAL_CAPSULE | Freq: Three times a day (TID) | ORAL | 0 refills | Status: DC | PRN

## 2017-10-11 MED ORDER — PREDNISONE 20 MG PO TABS: 40.0000 mg | ORAL_TABLET | Freq: Every day | ORAL | 0 refills | Status: DC

## 2017-10-11 NOTE — Progress Notes (Signed)
   Subjective:    Patient ID: Alicia Chen, female    DOB: May 03, 1966, 51 y.o.   MRN: [id number]  HPI The patient is a 52 YO female coming in for cough and cold symptoms. Started about 3 days ago. She is taking claritin and flonase and theraflu. She started to feel better about 1 day ago. She is feeling worse today. She is having cough and nose congestion. She denies sinus pressure. Denies fevers but some chills. Overall worsening. OTCs are helping some. She denies known sick contacts.   Review of Systems  Constitutional: Positive for activity change, appetite change and chills. Negative for fatigue, fever and unexpected weight change.  HENT: Positive for congestion, postnasal drip, rhinorrhea and sinus pressure. Negative for ear discharge, ear pain, sinus pain, sneezing, sore throat, tinnitus, trouble swallowing and voice change.   Eyes: Negative.   Respiratory: Positive for cough. Negative for chest tightness, shortness of breath and wheezing.   Cardiovascular: Negative.   Gastrointestinal: Negative.   Musculoskeletal: Positive for myalgias.  Neurological: Negative.       Objective:   Physical Exam  Constitutional: She is oriented to person, place, and time. She appears well-developed and well-nourished.  HENT:  Head: Normocephalic and atraumatic.  Oropharynx with redness and clear drainage, nose with swollen turbinates, TMs normal bilaterally  Eyes: EOM are normal.  Neck: Normal range of motion. No thyromegaly present.  Cardiovascular: Normal rate and regular rhythm.  Pulmonary/Chest: Effort normal and breath sounds normal. No respiratory distress. She has no wheezes. She has no rales.  Abdominal: Soft.  Lymphadenopathy:    She has no cervical adenopathy.  Neurological: She is alert and oriented to person, place, and time.  Skin: Skin is warm and dry.   Vitals:   10/11/17 1300  BP: 118/82  Pulse: 81  Temp: 98.5 F (36.9 C)  TempSrc: Oral  SpO2: 97%  Weight: 165 lb (74.8  kg)  Height: 5\' 2"  (1.575 m)      Assessment & Plan:

## 2017-10-11 NOTE — Patient Instructions (Signed)
We will have you start taking symbicort twice a day for the next week or two.  We have sent in prednisone to start taking this weekend if you are feeling worse. If needed take 2 pills daily for 5 days.  We have sent in tessalon perles to use up to 3 times a day for cough.

## 2017-10-13 ENCOUNTER — Encounter: Payer: Self-pay | Admitting: Internal Medicine

## 2017-10-13 DIAGNOSIS — B9789 Other viral agents as the cause of diseases classified elsewhere: Principal | ICD-10-CM

## 2017-10-13 DIAGNOSIS — J069 Acute upper respiratory infection, unspecified: Secondary | ICD-10-CM | POA: Insufficient documentation

## 2017-10-13 NOTE — Assessment & Plan Note (Signed)
Rx for tessalon perles and prednisone. Advised of typical course and continue the otcs she is already taking.

## 2017-10-22 ENCOUNTER — Ambulatory Visit: Payer: 59 | Admitting: Obstetrics and Gynecology

## 2017-10-22 ENCOUNTER — Encounter: Payer: Self-pay | Admitting: Obstetrics and Gynecology

## 2017-10-22 ENCOUNTER — Telehealth: Payer: Self-pay | Admitting: Obstetrics and Gynecology

## 2017-10-22 ENCOUNTER — Other Ambulatory Visit: Payer: Self-pay

## 2017-10-22 VITALS — BP 128/80 | HR 80 | Resp 14 | Wt 170.0 lb

## 2017-10-22 DIAGNOSIS — B354 Tinea corporis: Secondary | ICD-10-CM

## 2017-10-22 MED ORDER — KETOCONAZOLE 2 % EX CREA: 1.0000 "application " | TOPICAL_CREAM | Freq: Every day | CUTANEOUS | 0 refills | Status: DC

## 2017-10-22 NOTE — Telephone Encounter (Signed)
Patient has rash on left breast.

## 2017-10-22 NOTE — Telephone Encounter (Signed)
Spoke with patient. Patient states that she noticed a rash on her left breast last week. Rash is circular, dark, and itchy. Denies any pain or swelling to the breast. Has switched soaps recently, but reports she has not have a rash any where else on body. Patient is concerned. Appointment scheduled for today at 11:45 am with Dr.Jertson. Patient is agreeable to date and time.  Routing to provider for final review. Patient agreeable to disposition. Will close encounter.

## 2017-10-22 NOTE — Patient Instructions (Signed)

## 2017-10-22 NOTE — Progress Notes (Signed)
GYNECOLOGY  VISIT   HPI: 52 y.o.   Divorced  [place] American  female   [phone] with No LMP recorded. Patient is not currently having periods (Reason: IUD).   here c/o itchy rash on left breast. She noticed the rash in the last week. Not painful, just mildly itchy. She may have changed soaps. She is just getting over a viral infection.    GYNECOLOGIC HISTORY: No LMP recorded. Patient is not currently having periods (Reason: IUD). Contraception:IUD Menopausal hormone therapy: none         OB History    Gravida Para Term Preterm AB Living   2 2 1 1   2    SAB TAB Ectopic Multiple Live Births         2 4         Patient Active Problem List   Diagnosis Date Noted  . Viral URI with cough 10/13/2017  . Elevated LFTs 12/04/2016  . Routine general medical examination at a health care facility 07/11/2016  . Trigger thumb of left hand 04/05/2015  . OSA (obstructive sleep apnea) 02/05/2013  . Obesity 09/26/2010  . ANEMIA-NOS 02/09/2010  . DEPRESSION 02/09/2010  . MIGRAINE HEADACHE 02/09/2010  . ALLERGIC RHINITIS 02/09/2010  . Asthma 02/09/2010  . GERD 02/09/2010  . ARTHRITIS 02/09/2010    Past Medical History:  Diagnosis Date  . ALLERGIC RHINITIS   . ANEMIA-NOS   . ARTHRITIS   . ASTHMA   . DEPRESSION   . Fibroid   . GERD   . MIGRAINE HEADACHE   . OBESITY, MORBID   . Vitamin D deficiency     Past Surgical History:  Procedure Laterality Date  . CHOLECYSTECTOMY  2009  . ESOPHAGOGASTRODUODENOSCOPY N/A 12/23/2012   Procedure: ESOPHAGOGASTRODUODENOSCOPY (EGD);  Surgeon: Juanita Craver, MD;  Location: [facility];  Service: Endoscopy;  Laterality: N/A;  . INTRAUTERINE DEVICE (IUD) INSERTION  12-15-13   Mirena  . [place] RESECTION  2/17  . Skin graft surgery (R) leg     childhood    Current Outpatient Medications  Medication Sig Dispense Refill  . albuterol (VENTOLIN HFA) 108 (90 Base) MCG/ACT inhaler Inhale 1-2 puffs into the lungs every 4 (four) hours as  needed for wheezing or shortness of breath. 18 g 1  . ALPRAZolam (XANAX) 0.5 MG tablet TAKE 1 TABLET BY MOUTH DAILY AS NEEDED FOR ANXIETY 30 tablet 0  . benzonatate (TESSALON) 200 MG capsule Take 1 capsule (200 mg total) by mouth 3 (three) times daily as needed for cough. 45 capsule 0  . betamethasone valerate ointment (VALISONE) 0.1 % Apply a pea sized amount BID x 1-2 weeks as needed 15 g 0  . budesonide-formoterol (SYMBICORT) 160-4.5 MCG/ACT inhaler Inhale 2 puffs into the lungs 2 (two) times daily. 1 Inhaler 0  . calcium carbonate (TUMS - DOSED IN MG ELEMENTAL CALCIUM) 500 MG chewable tablet Chew by mouth.    . Cholecalciferol (VITAMIN D) 2000 units tablet Take 2,000 Units by mouth daily.    Marland Kitchen dexlansoprazole (DEXILANT) 60 MG capsule Take 1 capsule (60 mg total) by mouth daily. 90 capsule 3  . famotidine (PEPCID) 20 MG tablet Take 1 tablet (20 mg total) by mouth 2 (two) times daily as needed for heartburn or indigestion. 180 tablet 3  . fluticasone (FLONASE) 50 MCG/ACT nasal spray Place 2 sprays into both nostrils as needed for allergies. 48 g 3  . levonorgestrel (MIRENA) 20 MCG/24HR IUD 1 each once by Intrauterine route.    Marland Kitchen  linaclotide (LINZESS) 145 MCG CAPS capsule Take 1 capsule by mouth 2 (two) times daily.    Marland Kitchen loratadine (CLARITIN) 10 MG tablet Take 1 tablet (10 mg total) by mouth daily. 90 tablet 3  . magnesium hydroxide (MILK OF MAGNESIA) 400 MG/5ML suspension Take by mouth.    . meclizine (ANTIVERT) 25 MG tablet Take 1 tablet (25 mg total) by mouth 3 (three) times daily as needed. 30 tablet 0  . Multiple Vitamin (MULTIVITAMIN) tablet Take 1 tablet by mouth daily.    Marland Kitchen nystatin cream (MYCOSTATIN) Apply 1 application topically 2 (two) times daily. Apply to affected area BID for up to 7 days. 30 g 0  . predniSONE (DELTASONE) 20 MG tablet Take 2 tablets (40 mg total) by mouth daily with breakfast. 10 tablet 0   No current facility-administered medications for this visit.       ALLERGIES: Penicillins  Family History  Problem Relation Age of Onset  . Arthritis Mother   . Arthritis Father   . Alcohol abuse Other        Parents  . Arthritis Other        grandparents  . Diabetes Other        Parent & grandparent  . Hyperlipidemia Other        Parent, grandparent, other relative  . Hypertension Other        parent, grandparent, other relative  . Prostate cancer Other        grandfather  . Breast cancer Sister   . Breast cancer Sister     Social History   Socioeconomic History  . Marital status: Divorced    Spouse name: Not on file  . Number of children: Not on file  . Years of education: Not on file  . Highest education level: Not on file  Social Needs  . Financial resource strain: Not on file  . Food insecurity - worry: Not on file  . Food insecurity - inability: Not on file  . Transportation needs - medical: Not on file  . Transportation needs - non-medical: Not on file  Occupational History  . Occupation: [employer]    Employer: [employer]  Tobacco Use  . Smoking status: Never Smoker  . Smokeless tobacco: Never Used  Substance and Sexual Activity  . Alcohol use: Yes    Comment: Social  . Drug use: No  . Sexual activity: Yes    Partners: Male    Birth control/protection: Condom, IUD  Other Topics Concern  . 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   HENT: Negative.   Eyes: Negative.   Respiratory: Negative.   Cardiovascular: Negative.   Gastrointestinal: Negative.   Genitourinary:       Itchy rash on left breast   Musculoskeletal: Negative.   Skin: Negative.   Neurological: Negative.   Endo/Heme/Allergies: Negative.   Psychiatric/Behavioral: Negative.     PHYSICAL EXAMINATION:    BP 128/80 (BP Location: Right Arm, Patient Position: Sitting, Cuff Size:  Normal)   Pulse 80   Resp 14   Wt 170 lb (77.1 kg)   BMI 31.09 kg/m     General appearance: alert, cooperative and appears stated age Breasts: on the left breast are 2 round patches of increased pigmentation, slightly scaley. No lumps, dimpling or adenopathy.  She also had a patch on her right upper, anterior  thigh.  ASSESSMENT Tinea Corporis    PLAN Will treat with Ketoconazole She will f/u with her primary if the rash doesn't resolve    An After Visit Summary was printed and given to the patient.

## 2017-11-01 ENCOUNTER — Ambulatory Visit
Admission: RE | Admit: 2017-11-01 | Discharge: 2017-11-01 | Disposition: A | Discharge status: Home / Self Care | Payer: 59 | Source: Ambulatory Visit | Attending: Internal Medicine | Admitting: Internal Medicine

## 2017-11-01 DIAGNOSIS — Z1231 Encounter for screening mammogram for malignant neoplasm of breast: Secondary | ICD-10-CM

## 2017-11-06 ENCOUNTER — Ambulatory Visit: Payer: 59 | Admitting: Internal Medicine

## 2017-11-06 DIAGNOSIS — Z0289 Encounter for other administrative examinations: Secondary | ICD-10-CM

## 2017-11-07 ENCOUNTER — Ambulatory Visit: Payer: 59 | Admitting: Internal Medicine

## 2017-11-07 ENCOUNTER — Encounter: Payer: Self-pay | Admitting: Internal Medicine

## 2017-11-07 DIAGNOSIS — R21 Rash and other nonspecific skin eruption: Secondary | ICD-10-CM | POA: Diagnosis not present

## 2017-11-07 MED ORDER — NYSTATIN-TRIAMCINOLONE 100000-0.1 UNIT/GM-% EX OINT: 1.0000 "application " | TOPICAL_OINTMENT | Freq: Two times a day (BID) | CUTANEOUS | 0 refills | Status: DC

## 2017-11-07 NOTE — Progress Notes (Signed)
   Subjective:    Patient ID: Alicia Chen, female    DOB: Mar 04, 1966, 52 y.o.   MRN: [id number]  HPI The patient is a 52 YO female coming in for rash on her breast. Went to gyn about 2 weeks ago and they thought is was yeast infection. Came after taking prednisone for a cold. She tried ketoconazole cream on it and it is fading some. Still intensely itchy. She denies spreading or worsening. No change to soap/lotion/creams. Denies fevers or chills. Denies breast pain. Mammogram done recently and normal.   Review of Systems  Constitutional: Negative.   Respiratory: Negative for cough, chest tightness and shortness of breath.   Cardiovascular: Negative for chest pain, palpitations and leg swelling.  Gastrointestinal: Negative for abdominal distention, abdominal pain, constipation, diarrhea, nausea and vomiting.  Musculoskeletal: Negative.   Skin: Positive for rash.  Neurological: Negative.       Objective:   Physical Exam  Constitutional: She is oriented to person, place, and time. She appears well-developed and well-nourished.  HENT:  Head: Normocephalic and atraumatic.  Eyes: EOM are normal.  Neck: Normal range of motion.  Cardiovascular: Normal rate and regular rhythm.  Pulmonary/Chest: Effort normal and breath sounds normal. No respiratory distress. She has no wheezes. She has no rales.  Abdominal: Soft.  Musculoskeletal: She exhibits no edema.  Neurological: She is alert and oriented to person, place, and time. Coordination normal.  Skin: Skin is warm and dry. Rash noted.  Some fading candidal rash on the left breast, no tenderness, mass, nipple discharge, no axillary LAD   Vitals:   11/07/17 0809  BP: 100/72  Pulse: 83  Temp: 97.8 F (36.6 C)  TempSrc: Oral  SpO2: 99%  Weight: 173 lb (78.5 kg)  Height: 5\' 2"  (1.575 m)      Assessment & Plan:

## 2017-11-07 NOTE — Patient Instructions (Signed)
We will send in a cream to use twice a day called mycolog.

## 2017-11-07 NOTE — Assessment & Plan Note (Signed)
Likely inadequate treatment with only 15 g medicine prescribed previously. Will change to mycolog cream with adequate supply for 2 week treatment.

## 2017-11-19 ENCOUNTER — Encounter: Payer: 59 | Admitting: Internal Medicine

## 2017-11-19 DIAGNOSIS — Z0289 Encounter for other administrative examinations: Secondary | ICD-10-CM

## 2017-12-05 ENCOUNTER — Other Ambulatory Visit (INDEPENDENT_AMBULATORY_CARE_PROVIDER_SITE_OTHER): Payer: 59

## 2017-12-05 ENCOUNTER — Encounter: Payer: Self-pay | Admitting: Internal Medicine

## 2017-12-05 ENCOUNTER — Ambulatory Visit (INDEPENDENT_AMBULATORY_CARE_PROVIDER_SITE_OTHER): Payer: 59 | Admitting: Internal Medicine

## 2017-12-05 VITALS — BP 122/84 | HR 68 | Temp 97.8°F | Ht 62.0 in | Wt 173.0 lb

## 2017-12-05 DIAGNOSIS — J4520 Mild intermittent asthma, uncomplicated: Secondary | ICD-10-CM | POA: Diagnosis not present

## 2017-12-05 DIAGNOSIS — M25512 Pain in left shoulder: Secondary | ICD-10-CM | POA: Diagnosis not present

## 2017-12-05 DIAGNOSIS — K219 Gastro-esophageal reflux disease without esophagitis: Secondary | ICD-10-CM | POA: Diagnosis not present

## 2017-12-05 DIAGNOSIS — M1990 Unspecified osteoarthritis, unspecified site: Secondary | ICD-10-CM

## 2017-12-05 DIAGNOSIS — E669 Obesity, unspecified: Secondary | ICD-10-CM | POA: Diagnosis not present

## 2017-12-05 DIAGNOSIS — Z6831 Body mass index (BMI) 31.0-31.9, adult: Secondary | ICD-10-CM | POA: Diagnosis not present

## 2017-12-05 DIAGNOSIS — Z0000 Encounter for general adult medical examination without abnormal findings: Secondary | ICD-10-CM

## 2017-12-05 DIAGNOSIS — E66811 Obesity, class 1: Secondary | ICD-10-CM

## 2017-12-05 LAB — COMPREHENSIVE METABOLIC PANEL
ALT: 16 U/L (ref 0–35)
AST: 18 U/L (ref 0–37)
Albumin: 4.1 g/dL (ref 3.5–5.2)
Alkaline Phosphatase: 109 U/L (ref 39–117)
BUN: 19 mg/dL (ref 6–23)
CALCIUM: 9.8 mg/dL (ref 8.4–10.5)
CHLORIDE: 102 meq/L (ref 96–112)
CO2: 30 meq/L (ref 19–32)
Creatinine, Ser: 1.04 mg/dL (ref 0.40–1.20)
GFR: 71.65 mL/min (ref >60.00)
Glucose, Bld: 64 mg/dL — ABNORMAL LOW (ref 70–99)
POTASSIUM: 4.1 meq/L (ref 3.5–5.1)
Sodium: 139 mEq/L (ref 135–145)
Total Bilirubin: 0.5 mg/dL (ref 0.2–1.2)
Total Protein: 7.2 g/dL (ref 6.0–8.3)

## 2017-12-05 LAB — LIPID PANEL
CHOL/HDL RATIO: 2
CHOLESTEROL: 185 mg/dL (ref 0–200)
HDL: 91.8 mg/dL (ref >39.00)
LDL CALC: 81 mg/dL (ref 0–99)
NonHDL: 93.49
Triglycerides: 61 mg/dL (ref 0.0–149.0)
VLDL: 12.2 mg/dL (ref 0.0–40.0)

## 2017-12-05 LAB — HEMOGLOBIN A1C: Hgb A1c MFr Bld: 5.3 % (ref 4.6–6.5)

## 2017-12-05 LAB — VITAMIN D 25 HYDROXY (VIT D DEFICIENCY, FRACTURES): VITD: 71.41 ng/mL (ref 30.00–100.00)

## 2017-12-05 LAB — VITAMIN B12: VITAMIN B 12: 1245 pg/mL — AB (ref 211–911)

## 2017-12-05 LAB — TSH: TSH: 0.86 u[IU]/mL (ref 0.35–4.50)

## 2017-12-05 NOTE — Assessment & Plan Note (Signed)
Improved some with weight loss.

## 2017-12-05 NOTE — Assessment & Plan Note (Signed)
Doing well and albuterol prn and good allergy control seasonally.

## 2017-12-05 NOTE — Assessment & Plan Note (Signed)
Stable s/p sleeve procedure and this helped her to lose close to 150 pounds.

## 2017-12-05 NOTE — Patient Instructions (Signed)
We will get you in for physical therapy and check the labs today.   Health Maintenance, Female Adopting a healthy lifestyle and getting preventive care can go a long way to promote health and wellness. Talk with your health care provider about what schedule of regular examinations is right for you. This is a good chance for you to check in with your provider about disease prevention and staying healthy. In between checkups, there are plenty of things you can do on your own. Experts have done a lot of research about which lifestyle changes and preventive measures are most likely to keep you healthy. Ask your health care provider for more information. Weight and diet Eat a healthy diet  Be sure to include plenty of vegetables, fruits, low-fat dairy products, and lean protein.  Do not eat a lot of foods high in solid fats, added sugars, or salt.  Get regular exercise. This is one of the most important things you can do for your health. ? Most adults should exercise for at least 150 minutes each week. The exercise should increase your heart rate and make you sweat (moderate-intensity exercise). ? Most adults should also do strengthening exercises at least twice a week. This is in addition to the moderate-intensity exercise.  Maintain a healthy weight  Body mass index (BMI) is a measurement that can be used to identify possible weight problems. It estimates body fat based on height and weight. Your health care provider can help determine your BMI and help you achieve or maintain a healthy weight.  For females 23 years of age and older: ? A BMI below 18.5 is considered underweight. ? A BMI of 18.5 to 24.9 is normal. ? A BMI of 25 to 29.9 is considered overweight. ? A BMI of 30 and above is considered obese.  Watch levels of cholesterol and blood lipids  You should start having your blood tested for lipids and cholesterol at 52 years of age, then have this test every 5 years.  You may need to  have your cholesterol levels checked more often if: ? Your lipid or cholesterol levels are high. ? You are older than 52 years of age. ? You are at high risk for heart disease.  Cancer screening Lung Cancer  Lung cancer screening is recommended for adults 69-19 years old who are at high risk for lung cancer because of a history of smoking.  A yearly low-dose CT scan of the lungs is recommended for people who: ? Currently smoke. ? Have quit within the past 15 years. ? Have at least a 30-pack-year history of smoking. A pack year is smoking an average of one pack of cigarettes a day for 1 year.  Yearly screening should continue until it has been 15 years since you quit.  Yearly screening should stop if you develop a health problem that would prevent you from having lung cancer treatment.  Breast Cancer  Practice breast self-awareness. This means understanding how your breasts normally appear and feel.  It also means doing regular breast self-exams. Let your health care provider know about any changes, no matter how small.  If you are in your 20s or 30s, you should have a clinical breast exam (CBE) by a health care provider every 1-3 years as part of a regular health exam.  If you are 33 or older, have a CBE every year. Also consider having a breast X-ray (mammogram) every year.  If you have a family history of breast cancer, talk  to your health care provider about genetic screening.  If you are at high risk for breast cancer, talk to your health care provider about having an MRI and a mammogram every year.  Breast cancer gene (BRCA) assessment is recommended for women who have family members with BRCA-related cancers. BRCA-related cancers include: ? Breast. ? Ovarian. ? Tubal. ? Peritoneal cancers.  Results of the assessment will determine the need for genetic counseling and BRCA1 and BRCA2 testing.  Cervical Cancer Your health care provider may recommend that you be screened  regularly for cancer of the pelvic organs (ovaries, uterus, and vagina). This screening involves a pelvic examination, including checking for microscopic changes to the surface of your cervix (Pap test). You may be encouraged to have this screening done every 3 years, beginning at age 21.  For women ages 30-65, health care providers may recommend pelvic exams and Pap testing every 3 years, or they may recommend the Pap and pelvic exam, combined with testing for human papilloma virus (HPV), every 5 years. Some types of HPV increase your risk of cervical cancer. Testing for HPV may also be done on women of any age with unclear Pap test results.  Other health care providers may not recommend any screening for nonpregnant women who are considered low risk for pelvic cancer and who do not have symptoms. Ask your health care provider if a screening pelvic exam is right for you.  If you have had past treatment for cervical cancer or a condition that could lead to cancer, you need Pap tests and screening for cancer for at least 20 years after your treatment. If Pap tests have been discontinued, your risk factors (such as having a new sexual partner) need to be reassessed to determine if screening should resume. Some women have medical problems that increase the chance of getting cervical cancer. In these cases, your health care provider may recommend more frequent screening and Pap tests.  Colorectal Cancer  This type of cancer can be detected and often prevented.  Routine colorectal cancer screening usually begins at 52 years of age and continues through 52 years of age.  Your health care provider may recommend screening at an earlier age if you have risk factors for colon cancer.  Your health care provider may also recommend using home test kits to check for hidden blood in the stool.  A small camera at the end of a tube can be used to examine your colon directly (sigmoidoscopy or colonoscopy). This is  done to check for the earliest forms of colorectal cancer.  Routine screening usually begins at age 50.  Direct examination of the colon should be repeated every 5-10 years through 52 years of age. However, you may need to be screened more often if early forms of precancerous polyps or small growths are found.  Skin Cancer  Check your skin from head to toe regularly.  Tell your health care provider about any new moles or changes in moles, especially if there is a change in a mole's shape or color.  Also tell your health care provider if you have a mole that is larger than the size of a pencil eraser.  Always use sunscreen. Apply sunscreen liberally and repeatedly throughout the day.  Protect yourself by wearing long sleeves, pants, a wide-brimmed hat, and sunglasses whenever you are outside.  Heart disease, diabetes, and high blood pressure  High blood pressure causes heart disease and increases the risk of stroke. High blood pressure is more   likely to develop in: ? People who have blood pressure in the high end of the normal range (130-139/85-89 mm Hg). ? People who are overweight or obese. ? People who are African American.  If you are 90-95 years of age, have your blood pressure checked every 3-5 years. If you are 4 years of age or older, have your blood pressure checked every year. You should have your blood pressure measured twice-once when you are at a hospital or clinic, and once when you are not at a hospital or clinic. Record the average of the two measurements. To check your blood pressure when you are not at a hospital or clinic, you can use: ? An automated blood pressure machine at a pharmacy. ? A home blood pressure monitor.  If you are between 44 years and 21 years old, ask your health care provider if you should take aspirin to prevent strokes.  Have regular diabetes screenings. This involves taking a blood sample to check your fasting blood sugar level. ? If you are  at a normal weight and have a low risk for diabetes, have this test once every three years after 52 years of age. ? If you are overweight and have a high risk for diabetes, consider being tested at a younger age or more often. Preventing infection Hepatitis B  If you have a higher risk for hepatitis B, you should be screened for this virus. You are considered at high risk for hepatitis B if: ? You were born in a country where hepatitis B is common. Ask your health care provider which countries are considered high risk. ? Your parents were born in a high-risk country, and you have not been immunized against hepatitis B (hepatitis B vaccine). ? You have HIV or AIDS. ? You use needles to inject street drugs. ? You live with someone who has hepatitis B. ? You have had sex with someone who has hepatitis B. ? You get hemodialysis treatment. ? You take certain medicines for conditions, including cancer, organ transplantation, and autoimmune conditions.  Hepatitis C  Blood testing is recommended for: ? Everyone born from [birth year] through 1965. ? Anyone with known risk factors for hepatitis C.  Sexually transmitted infections (STIs)  You should be screened for sexually transmitted infections (STIs) including gonorrhea and chlamydia if: ? You are sexually active and are younger than 52 years of age. ? You are older than 52 years of age and your health care provider tells you that you are at risk for this type of infection. ? Your sexual activity has changed since you were last screened and you are at an increased risk for chlamydia or gonorrhea. Ask your health care provider if you are at risk.  If you do not have HIV, but are at risk, it may be recommended that you take a prescription medicine daily to prevent HIV infection. This is called pre-exposure prophylaxis (PrEP). You are considered at risk if: ? You are sexually active and do not regularly use condoms or know the HIV status of your  partner(s). ? You take drugs by injection. ? You are sexually active with a partner who has HIV.  Talk with your health care provider about whether you are at high risk of being infected with HIV. If you choose to begin PrEP, you should first be tested for HIV. You should then be tested every 3 months for as long as you are taking PrEP. Pregnancy  If you are premenopausal and you may  become pregnant, ask your health care provider about preconception counseling.  If you may become pregnant, take 400 to 800 micrograms (mcg) of folic acid every day.  If you want to prevent pregnancy, talk to your health care provider about birth control (contraception). Osteoporosis and menopause  Osteoporosis is a disease in which the bones lose minerals and strength with aging. This can result in serious bone fractures. Your risk for osteoporosis can be identified using a bone density scan.  If you are 40 years of age or older, or if you are at risk for osteoporosis and fractures, ask your health care provider if you should be screened.  Ask your health care provider whether you should take a calcium or vitamin D supplement to lower your risk for osteoporosis.  Menopause may have certain physical symptoms and risks.  Hormone replacement therapy may reduce some of these symptoms and risks. Talk to your health care provider about whether hormone replacement therapy is right for you. Follow these instructions at home:  Schedule regular health, dental, and eye exams.  Stay current with your immunizations.  Do not use any tobacco products including cigarettes, chewing tobacco, or electronic cigarettes.  If you are pregnant, do not drink alcohol.  If you are breastfeeding, limit how much and how often you drink alcohol.  Limit alcohol intake to no more than 1 drink per day for nonpregnant women. One drink equals 12 ounces of beer, 5 ounces of wine, or 1 ounces of hard liquor.  Do not use street  drugs.  Do not share needles.  Ask your health care provider for help if you need support or information about quitting drugs.  Tell your health care provider if you often feel depressed.  Tell your health care provider if you have ever been abused or do not feel safe at home. This information is not intended to replace advice given to you by your health care provider. Make sure you discuss any questions you have with your health care provider. Document Released: 02/06/2011 Document Revised: 12/30/2015 Document Reviewed: 04/27/2015 Elsevier Interactive Patient Education  [employer].

## 2017-12-05 NOTE — Progress Notes (Signed)
   Subjective:    Patient ID: Tamiya Colello, female    DOB: 08/24/1965, 52 y.o.   MRN: [id number]  HPI The patient is a 52 YO female coming in for physical. No new concerns.  PMH, [facility], social history reviewed and updated  Review of Systems  Constitutional: Negative.   HENT: Negative.   Eyes: Negative.   Respiratory: Negative for cough, chest tightness and shortness of breath.   Cardiovascular: Negative for chest pain, palpitations and leg swelling.  Gastrointestinal: Negative for abdominal distention, abdominal pain, constipation, diarrhea, nausea and vomiting.  Musculoskeletal: Negative.   Skin: Negative.   Neurological: Negative.   Psychiatric/Behavioral: Negative.       Objective:   Physical Exam  Constitutional: She is oriented to person, place, and time. She appears well-developed and well-nourished.  HENT:  Head: Normocephalic and atraumatic.  Eyes: EOM are normal.  Neck: Normal range of motion.  Cardiovascular: Normal rate and regular rhythm.  Pulmonary/Chest: Effort normal and breath sounds normal. No respiratory distress. She has no wheezes. She has no rales.  Abdominal: Soft. Bowel sounds are normal. She exhibits no distension. There is no tenderness. There is no rebound.  Musculoskeletal: She exhibits no edema.  Neurological: She is alert and oriented to person, place, and time. Coordination normal.  Skin: Skin is warm and dry.  Psychiatric: She has a normal mood and affect.   Vitals:   12/05/17 0841  BP: 122/84  Pulse: 68  Temp: 97.8 F (36.6 C)  TempSrc: Oral  SpO2: 99%  Weight: 173 lb (78.5 kg)  Height: 5\' 2"  (1.575 m)      Assessment & Plan:

## 2017-12-05 NOTE — Assessment & Plan Note (Signed)
Colonoscopy done June 2018 at [place] but will need to get records. Flu yearly. Tdap declined. Pap and mammogram with gyn up to date. Counseled about sun safety and mole surveillance. Counseled about dangers of distracted driving. Given screening recommendations.

## 2017-12-05 NOTE — Assessment & Plan Note (Addendum)
Taking dexilant and pepcid and gets EGD every 2 years due to sleeve procedure and stomach polyps.

## 2017-12-19 ENCOUNTER — Ambulatory Visit: Payer: 59 | Attending: Internal Medicine | Admitting: Physical Therapy

## 2017-12-19 ENCOUNTER — Other Ambulatory Visit: Payer: Self-pay

## 2017-12-19 ENCOUNTER — Encounter: Payer: Self-pay | Admitting: Physical Therapy

## 2017-12-19 DIAGNOSIS — M25612 Stiffness of left shoulder, not elsewhere classified: Secondary | ICD-10-CM | POA: Insufficient documentation

## 2017-12-19 DIAGNOSIS — M6281 Muscle weakness (generalized): Secondary | ICD-10-CM

## 2017-12-19 DIAGNOSIS — M25512 Pain in left shoulder: Secondary | ICD-10-CM | POA: Insufficient documentation

## 2017-12-19 NOTE — Therapy (Addendum)
[place], [place], [address] Phone: [phone]   Fax:  [phone]  Physical Therapy Evaluation  Patient Details  Name: Alicia Chen MRN: [id number] Date of Birth: 02-28-1966 Referring Provider: Pricilla Holm, MD   Encounter Date: 12/19/2017  PT End of Session - 12/19/17 1207    Visit Number  1    Number of Visits  13    PT Start Time  8101    PT Stop Time  1230    PT Time Calculation (min)  42 min    Activity Tolerance  Patient tolerated treatment well    Behavior During Therapy  [facility] for tasks assessed/performed       Past Medical History:  Diagnosis Date  . ALLERGIC RHINITIS   . ANEMIA-NOS   . ARTHRITIS   . ASTHMA   . DEPRESSION   . Fibroid   . GERD   . MIGRAINE HEADACHE   . OBESITY, MORBID   . Vitamin D deficiency     Past Surgical History:  Procedure Laterality Date  . CHOLECYSTECTOMY  2009  . ESOPHAGOGASTRODUODENOSCOPY N/A 12/23/2012   Procedure: ESOPHAGOGASTRODUODENOSCOPY (EGD);  Surgeon: Juanita Craver, MD;  Location: [facility];  Service: Endoscopy;  Laterality: N/A;  . INTRAUTERINE DEVICE (IUD) INSERTION  12-15-13   Mirena  . [place] RESECTION  2/17  . Skin graft surgery (R) leg     childhood    There were no vitals filed for this visit.   Subjective Assessment - 12/19/17 1157    Subjective  Pt arriving to therapy reporting  left shoulder pain 6/10. Pt reports the pain can increase to a 10/10. Pt reports she has been exercises and pt noticed her shoulder began to hurt. Pt reporting that her movements have been limited by pain. Pt reporting difficutly sleeping and reaching or lifting.     Limitations  House hold activities;Lifting    Patient Stated Goals  Stop hurting, be able to get back to my previous level    Currently in Pain?  Yes    Pain Score  6     Pain Location  Shoulder    Pain Orientation  Left    Pain Descriptors / Indicators  Aching    Pain Type  Acute pain    Pain Radiating Towards  to elbow    Pain Onset  More than a month ago    Pain Frequency  Constant    Aggravating Factors   reaching, sleeping, positioning    Pain Relieving Factors  resting         [facility] PT Assessment - 12/19/17 0001      Assessment   Medical Diagnosis  L shoulder pain    Referring Provider  Pricilla Holm, MD    Onset Date/Surgical Date  -- 1 month ago     Hand Dominance  Right    Prior Therapy  no      Precautions   Precautions  None      Restrictions   Weight Bearing Restrictions  No      Balance Screen   Has the patient fallen in the past 6 months  No    Is the patient reluctant to leave their home because of a fear of falling?   No      Home Film/video editor residence      Prior Function   Level of Independence  Independent    Vocation  Full time  employment    Vocation Requirements  works for the department of social services    Leisure  going to boot camp, exercising, pt has lost over 100 pounds      Cognition   Overall Cognitive Status  Within Functional Limits for tasks assessed      Observation/Other Assessments   Focus on Therapeutic Outcomes (FOTO)   66% limitation      Posture/Postural Control   Posture/Postural Control  No significant limitations      ROM / Strength   AROM / PROM / Strength  AROM;Strength      AROM   Overall AROM   Deficits    AROM Assessment Site  Shoulder    Right/Left Shoulder  Left    Left Shoulder Extension  40 Degrees    Left Shoulder Flexion  130 Degrees    Left Shoulder ABduction  60 Degrees    Left Shoulder Internal Rotation  -- thumb to T10    Left Shoulder External Rotation  70 Degrees      Strength   Strength Assessment Site  Shoulder    Right/Left Shoulder  Left    Left Shoulder Flexion  3-/5    Left Shoulder Extension  3-/5    Left Shoulder ABduction  3-/5    Left Shoulder Internal Rotation  3-/5    Left Shoulder External Rotation  3-/5       Palpation   Palpation comment  Pt with tenderness and trigger points noted in left upper trap, tender over biceps tendon       Special Tests    Special Tests  Rotator Cuff Impingement    Rotator Cuff Impingment tests  Full Can test      Full Can test   Findings  Positive    Side  Left                Objective measurements completed on examination: See above findings.              PT Education - 12/19/17 1226    Education provided  Yes    Education Details  self mobilization with tennis ball, HEP    Person(s) Educated  Patient    Methods  Explanation;Demonstration;Verbal cues    Comprehension  Verbalized understanding;Returned demonstration       PT Short Term Goals - 12/19/17 1227      PT SHORT TERM GOAL #1   Title  Pt will be independent in her HEP    Time  3    Period  Weeks    Status  New    Target Date  01/09/18        PT Long Term Goals - 12/19/17 1333      PT LONG TERM GOAL #2   Target Date  01/30/18      PT LONG TERM GOAL #3   Title  Pt will improve her FOTO from 66% limitation to </= 50% limitation    Time  6    Period  Weeks    Status  New    Target Date  01/30/18      PT LONG TERM GOAL #4   Title  Pt will improve her L shoulder strength to grossly >/= 4+/5 in order to improve function.     Time  6    Period  Weeks    Status  New    Target Date  01/30/18           Plan -  12/19/2017    Clinical Impression Statement Pt arriving to therpay for low complexity evaluation reporting left sided shoulder pain. Pt with active trigger points in her left upper trap with tenderness noted over biceps tendon. Pt presenting with decreased AROM with pain reported with all shoulder movements. Pt was issued a HEP. Skilled PT needed to progress pt toward her PLOF.    Clinical Presentation  Stable    Clinical Decision Making  Low    Rehab Potential  Good    PT Frequency  2x / week    PT Duration  4 weeks    PT Treatment/Interventions   Electrical Stimulation;ADLs/Self Care Home Management;Iontophoresis 4mg /ml Dexamethasone;Gait training;Stair training;Therapeutic activities;Therapeutic exercise;Dry needling;Taping;Passive range of motion    PT Next Visit Plan  cinsider pulleys, review HEP, progress strengthening exercises upper trap stretch. Consider ionto phoresis next visit if cert is signed,     Consulted and Agree with Plan of Care  Patient           Patient will benefit from skilled therapeutic intervention in order to improve the following deficits and impairments:     Visit Diagnosis: Acute pain of left shoulder  Stiffness of left shoulder, not elsewhere classified  Muscle weakness (generalized)     Problem List Patient Active Problem List   Diagnosis Date Noted  . Elevated LFTs 12/04/2016  . Routine general medical examination at a health care facility 07/11/2016  . Trigger thumb of left hand 04/05/2015  . OSA (obstructive sleep apnea) 02/05/2013  . Obesity 09/26/2010  . DEPRESSION 02/09/2010  . MIGRAINE HEADACHE 02/09/2010  . ALLERGIC RHINITIS 02/09/2010  . Asthma 02/09/2010  . GERD 02/09/2010  . Arthritis 02/09/2010    Oretha Caprice, MPT 12/19/2017, 1:45 PM  [facility] [address] [place], [place], [address] Phone: [phone]   Fax:  [phone]  Name: Alicia Chen MRN: [id number] Date of Birth: 12/10/1965

## 2017-12-26 ENCOUNTER — Other Ambulatory Visit: Payer: Self-pay

## 2017-12-26 MED ORDER — ALPRAZOLAM 0.5 MG PO TABS: 0.5000 mg | ORAL_TABLET | Freq: Every day | ORAL | 0 refills | Status: DC | PRN

## 2017-12-26 NOTE — Telephone Encounter (Signed)
Control database checked last refill: 05/09/2017  LOV: 12/05/2017

## 2017-12-26 NOTE — Telephone Encounter (Signed)
What medicine is requested?

## 2018-01-02 ENCOUNTER — Ambulatory Visit: Payer: Self-pay | Admitting: *Deleted

## 2018-01-02 ENCOUNTER — Ambulatory Visit: Payer: 59 | Admitting: Physical Therapy

## 2018-01-02 ENCOUNTER — Ambulatory Visit: Payer: 59 | Admitting: Internal Medicine

## 2018-01-02 NOTE — Telephone Encounter (Signed)
Flow at [facility] at [facility] notified regarding the patient's after effects of her dental procedure. Pt has an appointment scheduled for this afternoon. Pt also advised to notify the dentist of her swelling to the point of not being able to get her contact out of her right eye. She denies fever and states tenderness along the right jaw and pressure in the right ear. Pt voiced understanding.  Reason for Disposition . [1] Caller requesting NON-URGENT health information AND [2] PCP's office is the best resource  Answer Assessment - Initial Assessment Questions 1. REASON FOR CALL or QUESTION: "What is your reason for calling today?" or "How can I best help you?" or "What question do you have that I can help answer?"     Pt called with having swelling after having a root canal done yesterday. She has extensive swelling from her ear to below her jaw. She wanted to have blood work done to makes sure that no infection has gotten into her blood stream.  Protocols used: INFORMATION ONLY CALL-A-AH

## 2018-01-04 ENCOUNTER — Telehealth: Payer: Self-pay | Admitting: Physical Therapy

## 2018-01-04 ENCOUNTER — Ambulatory Visit: Payer: 59 | Admitting: Physical Therapy

## 2018-01-04 NOTE — Telephone Encounter (Signed)
Spoke with patient regarding No-show visit. Patient states that she has had a dental procedure and is swollen. She feels she will be able to come to her appointment next week. She was advised to please call if she can not.

## 2018-01-08 ENCOUNTER — Ambulatory Visit: Payer: 59 | Attending: Internal Medicine | Admitting: Physical Therapy

## 2018-01-08 ENCOUNTER — Encounter: Payer: Self-pay | Admitting: Physical Therapy

## 2018-01-08 DIAGNOSIS — M6281 Muscle weakness (generalized): Secondary | ICD-10-CM | POA: Insufficient documentation

## 2018-01-08 DIAGNOSIS — M25512 Pain in left shoulder: Secondary | ICD-10-CM

## 2018-01-08 DIAGNOSIS — M25612 Stiffness of left shoulder, not elsewhere classified: Secondary | ICD-10-CM | POA: Diagnosis not present

## 2018-01-08 NOTE — Therapy (Signed)
[place], [place], [address] Phone: [phone]   Fax:  [phone]  Physical Therapy Treatment  Patient Details  Name: Alicia Chen MRN: [id number] Date of Birth: 1966-02-11 Referring Provider: Pricilla Holm, MD   Encounter Date: 01/08/2018  PT End of Session - 01/08/18 0909    Visit Number  2    Number of Visits  13    PT Start Time  0845    PT Stop Time  0927    PT Time Calculation (min)  42 min    Activity Tolerance  Patient tolerated treatment well    Behavior During Therapy  [facility] for tasks assessed/performed       Past Medical History:  Diagnosis Date  . ALLERGIC RHINITIS   . ANEMIA-NOS   . ARTHRITIS   . ASTHMA   . DEPRESSION   . Fibroid   . GERD   . MIGRAINE HEADACHE   . OBESITY, MORBID   . Vitamin D deficiency     Past Surgical History:  Procedure Laterality Date  . CHOLECYSTECTOMY  2009  . ESOPHAGOGASTRODUODENOSCOPY N/A 12/23/2012   Procedure: ESOPHAGOGASTRODUODENOSCOPY (EGD);  Surgeon: Juanita Craver, MD;  Location: [facility];  Service: Endoscopy;  Laterality: N/A;  . INTRAUTERINE DEVICE (IUD) INSERTION  12-15-13   Mirena  . [place] RESECTION  2/17  . Skin graft surgery (R) leg     childhood    There were no vitals filed for this visit.  Subjective Assessment - 01/08/18 0849    Subjective  Patient reports her shoulder pain today is about a 4/10. She has been trying not to lie on the shoulder. She had a dental prcedure recently which has effected her for a few days.     Limitations  House hold activities;Lifting    Patient Stated Goals  Stop hurting, be able to get back to my previous level    Currently in Pain?  Yes    Pain Score  4     Pain Location  Shoulder    Pain Orientation  Left    Pain Descriptors / Indicators  Aching    Pain Type  Acute pain    Pain Onset  More than a month ago    Pain Frequency  Constant    Aggravating Factors   reaching,  sleeping, positioning     Pain Relieving Factors  resting     Multiple Pain Sites  No                       [facility] Adult PT Treatment/Exercise - 01/08/18 0001      Shoulder Exercises: Supine   Other Supine Exercises  wand flexion 2x10; supine flexion in low range 1lb 2x10; supine abduction/ adduction x10 1 lb       Shoulder Exercises: Sidelying   External Rotation Limitations  2x10 1lb       Shoulder Exercises: Standing   Extension Limitations  2x10 yellow     Row Limitations  2x10 yellow       Manual Therapy   Manual Therapy  Passive ROM;Joint mobilization    Joint Mobilization  Grade I and II mobilization inferiro and PA glides to improve IR and abduction     Passive ROM  into all planes              PT Education - 01/08/18 0909    Education provided  Yes    Education Details  updated HEP for light strengthening     Person(s) Educated  Patient    Methods  Explanation;Demonstration;Tactile cues;Verbal cues    Comprehension  Verbalized understanding;Returned demonstration;Verbal cues required;Tactile cues required       PT Short Term Goals - 01/08/18 1532      PT SHORT TERM GOAL #1   Title  Pt will be independent in her HEP    Baseline  working on HEP     Time  3    Period  Weeks    Status  On-going        PT Long Term Goals - 12/19/17 1333      PT LONG TERM GOAL #2   Target Date  01/30/18      PT LONG TERM GOAL #3   Title  Pt will improve her FOTO from 66% limitation to </= 50% limitation    Time  6    Period  Weeks    Status  New    Target Date  01/30/18      PT LONG TERM GOAL #4   Title  Pt will improve her L shoulder strength to grossly >/= 4+/5 in order to improve function.     Time  6    Period  Weeks    Status  New    Target Date  01/30/18            Plan - 01/08/18 0910    Clinical Impression Statement  Palpation of the cervical paraspinals and upper trap caused reffere pain down into her shoulder. It is likely a  contributor to her shoulder pain. Per visual inspection her shoulder range has improved. she had initial  tighness in shoulder abduction and intoernal rotation but that improved with mobilization. She tolerated exercises well. therapy added light scapualr strengthening to her POC. She was given an updated HEP for home.,     Clinical Presentation  Stable    Clinical Decision Making  Low    Rehab Potential  Good    PT Frequency  2x / week    PT Duration  4 weeks    PT Treatment/Interventions  Electrical Stimulation;ADLs/Self Care Home Management;Iontophoresis 4mg /ml Dexamethasone;Gait training;Stair training;Therapeutic activities;Therapeutic exercise;Dry needling;Taping;Passive range of motion    PT Next Visit Plan  cinsider pulleys, review HEP, progress strengthening exercises upper trap stretch. Consider ionto phoresis next visit if cert is signed,     Consulted and Agree with Plan of Care  Patient       Patient will benefit from skilled therapeutic intervention in order to improve the following deficits and impairments:  Pain, Postural dysfunction, Decreased strength, Decreased range of motion, Decreased endurance, Decreased activity tolerance, Impaired UE functional use  Visit Diagnosis: Acute pain of left shoulder  Stiffness of left shoulder, not elsewhere classified  Muscle weakness (generalized)     Problem List Patient Active Problem List   Diagnosis Date Noted  . Elevated LFTs 12/04/2016  . Routine general medical examination at a health care facility 07/11/2016  . Trigger thumb of left hand 04/05/2015  . OSA (obstructive sleep apnea) 02/05/2013  . Obesity 09/26/2010  . DEPRESSION 02/09/2010  . MIGRAINE HEADACHE 02/09/2010  . ALLERGIC RHINITIS 02/09/2010  . Asthma 02/09/2010  . GERD 02/09/2010  . Arthritis 02/09/2010    Carney Living PT DPT  01/08/2018, 3:36 PM  [facility] [address] [place], [place],  [address] Phone: [phone]   Fax:  [phone]  Name: Alicia Chen MRN:  [id number] Date of Birth: [birth year]

## 2018-01-08 NOTE — Addendum Note (Signed)
Addended by: Kearney Hard R on: 01/08/2018 12:45 PM   Modules accepted: Orders

## 2018-01-10 ENCOUNTER — Ambulatory Visit: Payer: 59 | Admitting: Physical Therapy

## 2018-01-10 ENCOUNTER — Encounter: Payer: Self-pay | Admitting: Physical Therapy

## 2018-01-10 DIAGNOSIS — M25612 Stiffness of left shoulder, not elsewhere classified: Secondary | ICD-10-CM

## 2018-01-10 DIAGNOSIS — M25512 Pain in left shoulder: Secondary | ICD-10-CM

## 2018-01-10 DIAGNOSIS — M6281 Muscle weakness (generalized): Secondary | ICD-10-CM

## 2018-01-10 NOTE — Therapy (Signed)
[place], [place], [address] Phone: [phone]   Fax:  [phone]  Physical Therapy Treatment  Patient Details  Name: Alicia Chen MRN: [id number] Date of Birth: 1966/02/22 Referring Provider: Pricilla Holm, MD   Encounter Date: 01/10/2018  PT End of Session - 01/10/18 1730    Visit Number  3    Number of Visits  13    PT Start Time  2979    PT Stop Time  1736    PT Time Calculation (min)  62 min    Activity Tolerance  Patient tolerated treatment well    Behavior During Therapy  [facility] for tasks assessed/performed       Past Medical History:  Diagnosis Date  . ALLERGIC RHINITIS   . ANEMIA-NOS   . ARTHRITIS   . ASTHMA   . DEPRESSION   . Fibroid   . GERD   . MIGRAINE HEADACHE   . OBESITY, MORBID   . Vitamin D deficiency     Past Surgical History:  Procedure Laterality Date  . CHOLECYSTECTOMY  2009  . ESOPHAGOGASTRODUODENOSCOPY N/A 12/23/2012   Procedure: ESOPHAGOGASTRODUODENOSCOPY (EGD);  Surgeon: Juanita Craver, MD;  Location: [facility];  Service: Endoscopy;  Laterality: N/A;  . INTRAUTERINE DEVICE (IUD) INSERTION  12-15-13   Mirena  . [place] RESECTION  2/17  . Skin graft surgery (R) leg     childhood    There were no vitals filed for this visit.  Subjective Assessment - 01/10/18 1641    Subjective  New Med antibiotics,  short session of those.  . Able to do a few more things since PT.    Currently in Pain?  Yes    Pain Location  Shoulder    Pain Radiating Towards  to forearm  with abduction.    Aggravating Factors   sleeping on side    Pain Relieving Factors  change of position,  heat                       [facility] Adult PT Treatment/Exercise - 01/10/18 0001      Self-Care   Self-Care  Posture      Shoulder Exercises: Standing   Internal Rotation  10 reps;AAROM cane sliding cane up back     Extension  10 reps cane behind    Extension  Limitations  20 x mds cues for technique,  ocerhead pull down 20 X 2ued initially Yellow band    Row Limitations  2x10 yellow  minor cues      Shoulder Exercises: Pulleys   Flexion  2 minutes    Flexion Limitations  noticed stretches with seat froward       Modalities   Modalities  Moist Heat      Moist Heat Therapy   Number Minutes Moist Heat  15 Minutes    Moist Heat Location  Shoulder      Manual Therapy   Manual therapy comments  soft tissue work.  patient tense, hard to relax,  tpper traps teres left tight able to soften to a certain degree.   anterior shoulder pec stretch manually left  for ROM             PT Education - 01/10/18 1727    Education provided  Yes    Education Details  exercise form,  Posture work/ bathroom    Person(s) Educated  Patient    Methods  Explanation;Demonstration;Verbal cues  Comprehension  Verbalized understanding;Returned demonstration       PT Short Term Goals - 01/08/18 1532      PT SHORT TERM GOAL #1   Title  Pt will be independent in her HEP    Baseline  working on HEP     Time  3    Period  Weeks    Status  On-going        PT Long Term Goals - 12/19/17 1333      PT LONG TERM GOAL #2   Target Date  01/30/18      PT LONG TERM GOAL #3   Title  Pt will improve her FOTO from 66% limitation to </= 50% limitation    Time  6    Period  Weeks    Status  New    Target Date  01/30/18      PT LONG TERM GOAL #4   Title  Pt will improve her L shoulder strength to grossly >/= 4+/5 in order to improve function.     Time  6    Period  Weeks    Status  New    Target Date  01/30/18            Plan - 01/10/18 1732    Clinical Impression Statement  Patient is consistant with her HEP,  She is diligent with try to use good posture at work.  She required min to mod cued with current HEP.  mild pain increase with session. Order for ionto not yet signed.     PT Next Visit Plan   pulleys,try scaption, review HEP, progress  strengthening exercises upper trap stretch. Consider ionto phoresis next visit if cert is signed, stretch    PT Home Exercise Plan  scapular squeeze     Consulted and Agree with Plan of Care  Patient       Patient will benefit from skilled therapeutic intervention in order to improve the following deficits and impairments:     Visit Diagnosis: Acute pain of left shoulder  Stiffness of left shoulder, not elsewhere classified  Muscle weakness (generalized)     Problem List Patient Active Problem List   Diagnosis Date Noted  . Elevated LFTs 12/04/2016  . Routine general medical examination at a health care facility 07/11/2016  . Trigger thumb of left hand 04/05/2015  . OSA (obstructive sleep apnea) 02/05/2013  . Obesity 09/26/2010  . DEPRESSION 02/09/2010  . MIGRAINE HEADACHE 02/09/2010  . ALLERGIC RHINITIS 02/09/2010  . Asthma 02/09/2010  . GERD 02/09/2010  . Arthritis 02/09/2010    Antonio Woodhams PTA 01/10/2018, 5:37 PM  [facility] [address] [place], [place], [address] Phone: [phone]   Fax:  [phone]  Name: Alicia Chen MRN: [id number] Date of Birth: 10/01/1965

## 2018-01-10 NOTE — Patient Instructions (Signed)
Adduction (Active)    Maintaining erect posture, draw shoulders back while bringing elbows back and inward. Repeat __10__ times. Do _several___ sessions per day.1-3 second hold.  Copyright  VHI. All rights reserved.  Sleeping on Back  Place pillow under knees. A pillow with cervical support and a roll around waist are also helpful. Copyright  VHI. All rights reserved.  Sleeping on Side Place pillow between knees. Use cervical support under neck and a roll around waist as needed. Copyright  VHI. All rights reserved.   Sleeping on Stomach   If this is the only desirable sleeping position, place pillow under lower legs, and under stomach or chest as needed.  Posture - Sitting   Sit upright, head facing forward. Try using a roll to support lower back. Keep shoulders relaxed, and avoid rounded back. Keep hips level with knees. Avoid crossing legs for long periods. Stand to Sit / Sit to Stand   To sit: Bend knees to lower self onto front edge of chair, then scoot back on seat. To stand: Reverse sequence by placing one foot forward, and scoot to front of seat. Use rocking motion to stand up.   Work Height and Reach  Ideal work height is no more than 2 to 4 inches below elbow level when standing, and at elbow level when sitting. Reaching should be limited to arm's length, with elbows slightly bent.  Bending  Bend at hips and knees, not back. Keep feet shoulder-width apart.    Posture - Standing   Good posture is important. Avoid slouching and forward head thrust. Maintain curve in low back and align ears over shoul- ders, hips over ankles.  Alternating Positions   Alternate tasks and change positions frequently to reduce fatigue and muscle tension. Take rest breaks. Computer Work   Position work to Programmer, multimedia. Use proper work and seat height. Keep shoulders back and down, wrists straight, and elbows at right angles. Use chair that provides full back support. Add  footrest and lumbar roll as needed.  Getting Into / Out of Car  Lower self onto seat, scoot back, then bring in one leg at a time. Reverse sequence to get out.  Dressing  Lie on back to pull socks or slacks over feet, or sit and bend leg while keeping back straight.    Housework - Sink  Place one foot on ledge of cabinet under sink when standing at sink for prolonged periods.   Pushing / Pulling  Pushing is preferable to pulling. Keep back in proper alignment, and use leg muscles to do the work.  Deep Squat   Squat and lift with both arms held against upper trunk. Tighten stomach muscles without holding breath. Use smooth movements to avoid jerking.  Avoid Twisting   Avoid twisting or bending back. Pivot around using foot movements, and bend at knees if needed when reaching for articles.  Carrying Luggage   Distribute weight evenly on both sides. Use a cart whenever possible. Do not twist trunk. Move body as a unit.   Lifting Principles .Maintain proper posture and head alignment. .Slide object as close as possible before lifting. .Move obstacles out of the way. .Test before lifting; ask for help if too heavy. .Tighten stomach muscles without holding breath. .Use smooth movements; do not jerk. .Use legs to do the work, and pivot with feet. .Distribute the work load symmetrically and close to the center of trunk. .Push instead of pull whenever possible.   Ask For Help  Ask for help and delegate to others when possible. Coordinate your movements when lifting together, and maintain the low back curve.  Log Roll   Lying on back, bend left knee and place left arm across chest. Roll all in one movement to the right. Reverse to roll to the left. Always move as one unit. Housework - Sweeping  Use long-handled equipment to avoid stooping.   Housework - Wiping  Position yourself as close as possible to reach work surface. Avoid straining your back.  Laundry -  Unloading Wash   To unload small items at bottom of washer, lift leg opposite to arm being used to reach.  [place] close to area to be raked. Use arm movements to do the work. Keep back straight and avoid twisting.     Cart  When reaching into cart with one arm, lift opposite leg to keep back straight.   Getting Into / Out of Bed  Lower self to lie down on one side by raising legs and lowering head at the same time. Use arms to assist moving without twisting. Bend both knees to roll onto back if desired. To sit up, start from lying on side, and use same move-ments in reverse. Housework - Vacuuming  Hold the vacuum with arm held at side. Step back and forth to move it, keeping head up. Avoid twisting.   Laundry - IT consultant so that bending and twisting can be avoided.   Laundry - Unloading Dryer  Squat down to reach into clothes dryer or use a reacher.  Gardening - Weeding / Probation officer or Kneel. Knee pads may be helpful.

## 2018-01-15 ENCOUNTER — Ambulatory Visit: Payer: 59 | Admitting: Physical Therapy

## 2018-01-15 ENCOUNTER — Encounter: Payer: Self-pay | Admitting: Physical Therapy

## 2018-01-15 DIAGNOSIS — M25512 Pain in left shoulder: Secondary | ICD-10-CM

## 2018-01-15 DIAGNOSIS — M25612 Stiffness of left shoulder, not elsewhere classified: Secondary | ICD-10-CM

## 2018-01-15 DIAGNOSIS — M6281 Muscle weakness (generalized): Secondary | ICD-10-CM

## 2018-01-15 NOTE — Patient Instructions (Addendum)
Strengthening: Isometric Flexion  Using wall for resistance, press right fist into ball using light pressure. Hold __5-10__ seconds. Repeat __5-10__ times per set. Do _1___ sets per session. Do _1___ sessions per day.  SHOULDER: Abduction (Isometric)  Use wall as resistance. Press arm against pillow. Keep elbow straight. Hold 5_- 10__ seconds. __5-10_ reps per set, _1__ sets per day, _7__ days per week  Extension (Isometric)  Place left bent elbow and back of arm against wall. Press elbow against wall. Hold 5-10____ seconds. Repeat __5-10_ times. Do __1__ sessions per day.  Internal Rotation (Isometric)  Place palm of right fist against door frame, with elbow bent. Press fist against door frame. Hold __5- 10__ seconds. Repeat _5-10 times. Do _1___ sessions per day.  External Rotation (Isometric)  Place back of left fist against door frame, with elbow bent. Press fist against door frame. Hold _5 -10___ seconds. Repeat __5-10__ times. Do __1__ sessions per day.  Copyright  VHI. All rights reserved.   IONTOPHORESIS PATIENT PRECAUTIONS & CONTRAINDICATIONS:  . Redness under one or both electrodes can occur.  This characterized by a uniform redness that usually disappears within 12 hours of treatment. . Small pinhead size blisters may result in response to the drug.  Contact your physician if the problem persists more than 24 hours. . On rare occasions, iontophoresis therapy can result in temporary skin reactions such as rash, inflammation, irritation or burns.  The skin reactions may be the result of individual sensitivity to the ionic solution used, the condition of the skin at the start of treatment, reaction to the materials in the electrodes, allergies or sensitivity to dexamethasone, or a poor connection between the patch and your skin.  Discontinue using iontophoresis if you have any of these reactions and report to your therapist. . Remove the Patch or electrodes if you have any  undue sensation of pain or burning during the treatment and report discomfort to your therapist. . Tell your Therapist if you have had known adverse reactions to the application of electrical current. . If using the Patch, the LED light will turn off when treatment is complete and the patch can be removed.  Approximate treatment time is 1-3 hours.  Remove the patch when light goes off or after 6 hours. . The Patch can be worn during normal activity, however excessive motion where the electrodes have been placed can cause poor contact between the skin and the electrode or uneven electrical current resulting in greater risk of skin irritation. Marland Kitchen Keep out of the reach of children.   . DO NOT use if you have a cardiac pacemaker or any other electrically sensitive implanted device. . DO NOT use if you have a known sensitivity to dexamethasone. . DO NOT use during Magnetic Resonance Imaging (MRI). . DO NOT use over broken or compromised skin (e.g. sunburn, cuts, or acne) due to the increased risk of skin reaction. . DO NOT SHAVE over the area to be treated:  To establish good contact between the Patch and the skin, excessive hair may be clipped. . DO NOT place the Patch or electrodes on or over your eyes, directly over your heart, or brain. . DO NOT reuse the Patch or electrodes as this may cause burns to occur.

## 2018-01-15 NOTE — Therapy (Signed)
Alicia Chen, [place], [address] Phone: [phone]   Fax:  [phone]  Physical Therapy Treatment  Patient Details  Name: Alicia Chen MRN: [id number] Date of Birth: 1965-12-16 Referring Provider: Pricilla Holm, MD   Encounter Date: 01/15/2018  PT End of Session - 01/15/18 0847    Visit Number  4    Number of Visits  13    PT Start Time  0734    PT Stop Time  0802    PT Time Calculation (min)  28 min    Activity Tolerance  Patient tolerated treatment well    Behavior During Therapy  [facility] for tasks assessed/performed       Past Medical History:  Diagnosis Date  . ALLERGIC RHINITIS   . ANEMIA-NOS   . ARTHRITIS   . ASTHMA   . DEPRESSION   . Fibroid   . GERD   . MIGRAINE HEADACHE   . OBESITY, MORBID   . Vitamin D deficiency     Past Surgical History:  Procedure Laterality Date  . CHOLECYSTECTOMY  2009  . ESOPHAGOGASTRODUODENOSCOPY N/A 12/23/2012   Procedure: ESOPHAGOGASTRODUODENOSCOPY (EGD);  Surgeon: Juanita Craver, MD;  Location: [facility];  Service: Endoscopy;  Laterality: N/A;  . INTRAUTERINE DEVICE (IUD) INSERTION  12-15-13   Mirena  . [place] RESECTION  2/17  . Skin graft surgery (R) leg     childhood    There were no vitals filed for this visit.  Subjective Assessment - 01/15/18 0738    Subjective  Did not do exercises yesterday,  did the exercises and was a little sore from.  The band works my arms( posterior)    Currently in Pain?  Yes    Pain Score  3     Pain Location  Shoulder    Pain Orientation  Left;Anterior;Posterior;Upper;Lower    Pain Descriptors / Indicators  -- pain,  it hurts    Pain Radiating Towards  to elbow,, to neck    Pain Frequency  Intermittent    Aggravating Factors   sleeping on side,  movement a certain way,  pressure throught hand.  reaching out window to punch key pad,  drive through.  needs 2 hands to get things from drive through.      Pain Relieving Factors  change of position ,  heat         [facility] PT Assessment - 01/15/18 0001      AROM   Left Shoulder Flexion  -- 160  cane supine    Left Shoulder External Rotation  -- 60 cane supine                   [facility] Adult PT Treatment/Exercise - 01/15/18 0001      Shoulder Exercises: Supine   Protraction  10 reps    Horizontal ABduction  10 reps    External Rotation  10 reps;AAROM cane    Flexion  10 reps cane      Shoulder Exercises: Isometric Strengthening   Flexion  5X5"    Extension  5X5"    External Rotation  5X5"    Internal Rotation  5X5"    ABduction  5X5"    ADduction  5X5"      Shoulder Exercises: Stretch   Other Shoulder Stretches  supinr thoracic stretch 3 minutes with riolled sheet between shoulderblades.       Iontophoresis   Type of Iontophoresis  Dexamethasone    Location  shoulder posterior    Dose  4 mg/ml,  1cc    Time  6 hour patch             PT Education - 01/15/18 0800    Education provided  Yes    Education Details  HEP.  Iontophoresis Info    Person(s) Educated  Patient    Methods  Explanation;Demonstration;Tactile cues;Verbal cues;Handout    Comprehension  Verbalized understanding;Returned demonstration       PT Short Term Goals - 01/08/18 1532      PT SHORT TERM GOAL #1   Title  Pt will be independent in her HEP    Baseline  working on HEP     Time  3    Period  Weeks    Status  On-going        PT Long Term Goals - 12/19/17 1333      PT LONG TERM GOAL #2   Target Date  01/30/18      PT LONG TERM GOAL #3   Title  Pt will improve her FOTO from 66% limitation to </= 50% limitation    Time  6    Period  Weeks    Status  New    Target Date  01/30/18      PT LONG TERM GOAL #4   Title  Pt will improve her L shoulder strength to grossly >/= 4+/5 in order to improve function.     Time  6    Period  Weeks    Status  New    Target Date  01/30/18            Plan - 01/15/18 0848     Clinical Impression Statement  1st Ionto treatment.  Able to tolerate isometrics without lasting soreness so added to HEP.  Supine AAROM improvinf see flow sheet. Compliant with HEP most of the time.     PT Next Visit Plan   pulleys,try scaption, review HEP, progress strengthening exercises upper trap stretch. Ionto assessment did it help, stretch    PT Home Exercise Plan  scapular squeeze ,  isometrics    Consulted and Agree with Plan of Care  Patient       Patient will benefit from skilled therapeutic intervention in order to improve the following deficits and impairments:     Visit Diagnosis: Acute pain of left shoulder  Stiffness of left shoulder, not elsewhere classified  Muscle weakness (generalized)     Problem List Patient Active Problem List   Diagnosis Date Noted  . Elevated LFTs 12/04/2016  . Routine general medical examination at a health care facility 07/11/2016  . Trigger thumb of left hand 04/05/2015  . OSA (obstructive sleep apnea) 02/05/2013  . Obesity 09/26/2010  . DEPRESSION 02/09/2010  . MIGRAINE HEADACHE 02/09/2010  . ALLERGIC RHINITIS 02/09/2010  . Asthma 02/09/2010  . GERD 02/09/2010  . Arthritis 02/09/2010    Ourania Hamler PTA 01/15/2018, 8:50 AM  [facility] [address] [place], [place], [address] Phone: [phone]   Fax:  [phone]  Name: Alicia Chen MRN: [id number] Date of Birth: 02/03/1966

## 2018-01-17 ENCOUNTER — Ambulatory Visit: Payer: 59 | Admitting: Physical Therapy

## 2018-01-22 ENCOUNTER — Ambulatory Visit: Payer: 59 | Admitting: Physical Therapy

## 2018-01-22 ENCOUNTER — Encounter: Payer: Self-pay | Admitting: Physical Therapy

## 2018-01-22 DIAGNOSIS — M25512 Pain in left shoulder: Secondary | ICD-10-CM | POA: Diagnosis not present

## 2018-01-22 DIAGNOSIS — M6281 Muscle weakness (generalized): Secondary | ICD-10-CM

## 2018-01-22 DIAGNOSIS — M25612 Stiffness of left shoulder, not elsewhere classified: Secondary | ICD-10-CM

## 2018-01-22 NOTE — Therapy (Signed)
[place], [place], [address] Phone: [phone]   Fax:  [phone]  Physical Therapy Treatment  Patient Details  Name: Alicia Chen MRN: [id number] Date of Birth: 1966/02/21 Referring Provider: Pricilla Holm, MD   Encounter Date: 01/22/2018  PT End of Session - 01/22/18 0805    Visit Number  5    Number of Visits  13    PT Start Time  0734    PT Stop Time  0800    PT Time Calculation (min)  26 min    Activity Tolerance  Patient tolerated treatment well    Behavior During Therapy  [facility] for tasks assessed/performed       Past Medical History:  Diagnosis Date  . ALLERGIC RHINITIS   . ANEMIA-NOS   . ARTHRITIS   . ASTHMA   . DEPRESSION   . Fibroid   . GERD   . MIGRAINE HEADACHE   . OBESITY, MORBID   . Vitamin D deficiency     Past Surgical History:  Procedure Laterality Date  . CHOLECYSTECTOMY  2009  . ESOPHAGOGASTRODUODENOSCOPY N/A 12/23/2012   Procedure: ESOPHAGOGASTRODUODENOSCOPY (EGD);  Surgeon: Juanita Craver, MD;  Location: [facility];  Service: Endoscopy;  Laterality: N/A;  . INTRAUTERINE DEVICE (IUD) INSERTION  12-15-13   Mirena  . [place] RESECTION  2/17  . Skin graft surgery (R) leg     childhood    There were no vitals filed for this visit.  Subjective Assessment - 01/22/18 0739    Subjective  She has no pain at rest.  Pain up to 5/10 over the weekend.  Patch helped.    Currently in Pain?  No/denies    Pain Score  -- up to 5/10    Pain Orientation  Left;Anterior;Posterior;Lower    Aggravating Factors   sleeping on shoulder    Pain Relieving Factors  change of position.  patch,   heat    Multiple Pain Sites  -- had back pain over the weekned                       [facility] Adult PT Treatment/Exercise - 01/22/18 0001      Shoulder Exercises: Supine   Horizontal ABduction  10 reps yellow    Flexion  10 reps cued for less pain    Diagonals   10 reps yellow,  each,  cues for technique      Shoulder Exercises: Standing   Protraction  10 reps    External Rotation  10 reps    Internal Rotation  10 reps yellow    Row  10 reps      Shoulder Exercises: Pulleys   Flexion  2 minutes    Scaption  2 minutes      Iontophoresis   Type of Iontophoresis  Dexamethasone    Location  shoulder posterior    Dose  4 mg/ml,  1cc    Time  6 hour patch             PT Education - 01/22/18 0803    Education provided  Yes    Education Details  HEP    Person(s) Educated  Patient    Methods  Explanation;Demonstration;Tactile cues;Verbal cues;Handout    Comprehension  Verbalized understanding       PT Short Term Goals - 01/22/18 6629      PT SHORT TERM GOAL #1   Title  Pt will be independent in  her HEP    Baseline  independent    Time  3    Period  Weeks    Status  Achieved        PT Long Term Goals - 12/19/17 1333      PT LONG TERM GOAL #2   Target Date  01/30/18      PT LONG TERM GOAL #3   Title  Pt will improve her FOTO from 66% limitation to </= 50% limitation    Time  6    Period  Weeks    Status  New    Target Date  01/30/18      PT LONG TERM GOAL #4   Title  Pt will improve her L shoulder strength to grossly >/= 4+/5 in order to improve function.     Time  6    Period  Weeks    Status  New    Target Date  01/30/18            Plan - 01/22/18 0806    Clinical Impression Statement  2nd Ionto treatment.  Abduction ARPM improving.   68 degrees.  Able to increase strengthening and AAhe WITH MILD INCREASE IN PAIN.  pATIENT DECLINED HEAT OR ice.    PT Next Visit Plan  SUPINE SCAP STAB,  review Rockwood    PT Home Exercise Plan  scapular squeeze ,  isometrics,  Rockwood     Consulted and Agree with Plan of Care  Patient       Patient will benefit from skilled therapeutic intervention in order to improve the following deficits and impairments:     Visit Diagnosis: Acute pain of left  shoulder  Stiffness of left shoulder, not elsewhere classified  Muscle weakness (generalized)     Problem List Patient Active Problem List   Diagnosis Date Noted  . Elevated LFTs 12/04/2016  . Routine general medical examination at a health care facility 07/11/2016  . Trigger thumb of left hand 04/05/2015  . OSA (obstructive sleep apnea) 02/05/2013  . Obesity 09/26/2010  . DEPRESSION 02/09/2010  . MIGRAINE HEADACHE 02/09/2010  . ALLERGIC RHINITIS 02/09/2010  . Asthma 02/09/2010  . GERD 02/09/2010  . Arthritis 02/09/2010    Plummer Matich PTA 01/22/2018, 8:10 AM  [facility] [address] [place], [place], [address] Phone: [phone]   Fax:  [phone]  Name: Alicia Chen MRN: [id number] Date of Birth: [birth year]

## 2018-01-22 NOTE — Patient Instructions (Signed)
Issued from ex drawer: Rockwood Daily o second hold 10 x each avoid pain increase Yellow band

## 2018-01-24 ENCOUNTER — Ambulatory Visit: Payer: 59 | Admitting: Physical Therapy

## 2018-01-29 ENCOUNTER — Encounter: Payer: Self-pay | Admitting: Physical Therapy

## 2018-01-29 ENCOUNTER — Ambulatory Visit: Payer: 59 | Admitting: Physical Therapy

## 2018-01-29 DIAGNOSIS — M25512 Pain in left shoulder: Secondary | ICD-10-CM

## 2018-01-29 DIAGNOSIS — M6281 Muscle weakness (generalized): Secondary | ICD-10-CM

## 2018-01-29 DIAGNOSIS — M25612 Stiffness of left shoulder, not elsewhere classified: Secondary | ICD-10-CM

## 2018-01-30 ENCOUNTER — Encounter: Payer: Self-pay | Admitting: Physical Therapy

## 2018-01-30 NOTE — Therapy (Addendum)
[place], [place], [address] Phone: [phone]   Fax:  [phone]  Physical Therapy Treatment/ Discharge   Patient Details  Name: Alicia Chen MRN: [id number] Date of Birth: 03/20/1966 Referring Provider: Pricilla Holm, MD   Encounter Date: 01/29/2018  PT End of Session - 01/29/18 1655    Visit Number  6    Number of Visits  [place]     PT Start Time  1640 Patient was 10 min late to appointment     PT Stop Time  1719    PT Time Calculation (min)  39 min    Activity Tolerance  Patient tolerated treatment well    Behavior During Therapy  [facility] for tasks assessed/performed       Past Medical History:  Diagnosis Date  . ALLERGIC RHINITIS   . ANEMIA-NOS   . ARTHRITIS   . ASTHMA   . DEPRESSION   . Fibroid   . GERD   . MIGRAINE HEADACHE   . OBESITY, MORBID   . Vitamin D deficiency     Past Surgical History:  Procedure Laterality Date  . CHOLECYSTECTOMY  2009  . ESOPHAGOGASTRODUODENOSCOPY N/A 12/23/2012   Procedure: ESOPHAGOGASTRODUODENOSCOPY (EGD);  Surgeon: Juanita Craver, MD;  Location: [facility];  Service: Endoscopy;  Laterality: N/A;  . INTRAUTERINE DEVICE (IUD) INSERTION  12-15-13   Mirena  . [place] RESECTION  2/17  . Skin graft surgery (R) leg     childhood    There were no vitals filed for this visit.  Subjective Assessment - 01/29/18 1643    Subjective  Patient reports she has pain mostly when she sleeps on it or uses her shoulder too much.     Limitations  House hold activities;Lifting    Patient Stated Goals  Stop hurting, be able to get back to my previous level    Currently in Pain?  No/denies                       [facility] Adult PT Treatment/Exercise - 01/30/18 0001      Shoulder Exercises: Supine   Flexion  10 reps cued for less pain    Diagonals  10 reps yellow,  each,  cues for technique      Shoulder Exercises:  Standing   External Rotation  10 reps;Theraband;Limitations    Theraband Level (Shoulder External Rotation)  Level 1 (Yellow)    Internal Rotation  Limitations;Theraband yellow    Theraband Level (Shoulder Internal Rotation)  Level 1 (Yellow)    Internal Rotation Limitations  2x10    Extension Limitations  2x10 yellow     Row  --    Row Limitations  2x10 yellow       Shoulder Exercises: Pulleys   Flexion  2 minutes    Scaption  2 minutes      Iontophoresis   Type of Iontophoresis  Dexamethasone    Location  shoulder posterior    Dose  4 mg/ml,  1cc    Time  6 hour patch             PT Education - 01/29/18 1645    Education provided  Yes    Education Details  reviewed [email or web address] with ther-ex     Person(s) Educated  Patient    Methods  Explanation;Demonstration;Tactile cues;Verbal cues    Comprehension  Verbalized understanding;Returned demonstration;Verbal cues required;Tactile cues required  PT Short Term Goals - 01/30/18 1304      PT SHORT TERM GOAL #1   Title  Pt will be independent in her HEP    Baseline  independent    Time  3    Period  Weeks    Status  Achieved        PT Long Term Goals - 12/19/17 1333      PT LONG TERM GOAL #2   Target Date  01/30/18      PT LONG TERM GOAL #3   Title  Pt will improve her FOTO from 66% limitation to </= 50% limitation    Time  6    Period  Weeks    Status  New    Target Date  01/30/18      PT LONG TERM GOAL #4   Title  Pt will improve her L shoulder strength to grossly >/= 4+/5 in order to improve function.     Time  6    Period  Weeks    Status  New    Target Date  01/30/18            Plan - 01/29/18 1658    Clinical Impression Statement  Therapy assessed patients movement. Shehad full movement but had pain with passive IR and end range flexion. Therapy worked on [employer] and by the end of manual therapy she had no pain with IR or flexion. Patient tolerated treatment well.     Clinical  Presentation  Stable    Clinical Decision Making  Low    Rehab Potential  Good    PT Frequency  2x / week    PT Duration  4 weeks    PT Treatment/Interventions  Electrical Stimulation;ADLs/Self Care Home Management;Iontophoresis '4mg'$ /ml Dexamethasone;Gait training;Stair training;Therapeutic activities;Therapeutic exercise;Dry needling;Taping;Passive range of motion    PT Next Visit Plan  SUPINE SCAP STAB,  review Rockwood; continue to proegress strengthening as tolerated.     PT Home Exercise Plan  scapular squeeze ,  isometrics,  Rockwood     Consulted and Agree with Plan of Care  Patient       Patient will benefit from skilled therapeutic intervention in order to improve the following deficits and impairments:  Pain, Postural dysfunction, Decreased strength, Decreased range of motion, Decreased endurance, Decreased activity tolerance, Impaired UE functional use  Visit Diagnosis: Acute pain of left shoulder  Stiffness of left shoulder, not elsewhere classified  Muscle weakness (generalized)     Problem List Patient Active Problem List   Diagnosis Date Noted  . Elevated LFTs 12/04/2016  . Routine general medical examination at a health care facility 07/11/2016  . Trigger thumb of left hand 04/05/2015  . OSA (obstructive sleep apnea) 02/05/2013  . Obesity 09/26/2010  . DEPRESSION 02/09/2010  . MIGRAINE HEADACHE 02/09/2010  . ALLERGIC RHINITIS 02/09/2010  . Asthma 02/09/2010  . GERD 02/09/2010  . Arthritis 02/09/2010   PHYSICAL THERAPY DISCHARGE SUMMARY  Visits from Start of Care:6  Current functional level related to goals / functional outcomes: Unknown patient did not show up  Remaining deficits: Continued pain    Education / Equipment:HEP  Plan: Patient agrees to discharge.  Patient goals were not met. Patient is being discharged due to not returning since the last visit.  ?????     Carney Living PT DPT  01/30/2018, 1:05 PM  [facility] [address] [place], [place], [address] Phone: [phone]   Fax:  [phone]  Name: Alicia Chen MRN: [id number] Date of Birth: 02-Jun-1966

## 2018-01-31 ENCOUNTER — Ambulatory Visit: Payer: 59 | Admitting: Physical Therapy

## 2018-02-05 ENCOUNTER — Telehealth: Payer: Self-pay | Admitting: Physical Therapy

## 2018-02-05 ENCOUNTER — Ambulatory Visit: Payer: 59 | Attending: Internal Medicine | Admitting: Physical Therapy

## 2018-02-05 NOTE — Telephone Encounter (Signed)
Left message on machine about missed visit.  There are no other PT visits scheduled.  I asked her to call and let us know if she still needs PT and if so please schedule appointments. Our phone number was given. Melvenia Needles PTA

## 2018-02-08 ENCOUNTER — Encounter: Payer: 59 | Admitting: Physical Therapy

## 2018-02-26 NOTE — Progress Notes (Signed)
52 y.o. [id number] DivorcedAfrican AmericanF here for annual exam.  She has a mirena IUD, placed in the spring of 2015. She is having some night sweats and hot flashes.  Engaged, lives with her fiance. He has ED, very frustrating for him.   She has a h/o a gastric sleeve in 2/17. Lost over 150 lbs, up 15 lb since last year. She has seen a nutritionist. Work has been stressful and she stress eats. She is thinking about changing jobs. She has started exercising.     No LMP recorded. (Menstrual status: IUD).          Sexually active: Yes.    The current method of family planning is IUD.    Exercising: Yes.    walking Smoker:  no  Health Maintenance: Pap:  02-14-16 WNL NEG HR HPV  History of abnormal Pap:  no MMG:  11/01/2017 Birads 1 negative Colonoscopy:  01/2017 WNL per patient  BMD:   Never TDaP:  2009  Gardasil: NA   reports that she has never smoked. She has never used smokeless tobacco. She reports that she drinks alcohol. She reports that she does not use drugs. Just occasional ETOH. 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 She has twin daughters, 13.    Past Medical History:  Diagnosis Date  . ALLERGIC RHINITIS   . ANEMIA-NOS   . ARTHRITIS   . ASTHMA   . DEPRESSION   . Fibroid   . GERD   . MIGRAINE HEADACHE   . OBESITY, MORBID   . Vitamin D deficiency     Past Surgical History:  Procedure Laterality Date  . CHOLECYSTECTOMY  2009  . ESOPHAGOGASTRODUODENOSCOPY N/A 12/23/2012   Procedure: ESOPHAGOGASTRODUODENOSCOPY (EGD);  Surgeon: Juanita Craver, MD;  Location: [facility];  Service: Endoscopy;  Laterality: N/A;  . INTRAUTERINE DEVICE (IUD) INSERTION  12-15-13   Mirena  . [place] RESECTION  2/17  . ROOT CANAL    . Skin graft surgery (R) leg     childhood    Current Outpatient Medications  Medication Sig Dispense Refill  . albuterol (VENTOLIN HFA) 108 (90 Base) MCG/ACT inhaler Inhale 1-2 puffs into the lungs every 4  (four) hours as needed for wheezing or shortness of breath. 18 g 1  . ALPRAZolam (XANAX) 0.5 MG tablet Take 1 tablet (0.5 mg total) by mouth daily as needed. for anxiety 30 tablet 0  . budesonide-formoterol (SYMBICORT) 160-4.5 MCG/ACT inhaler Inhale 2 puffs into the lungs 2 (two) times daily. 1 Inhaler 0  . Cholecalciferol (VITAMIN D) 2000 units tablet Take 2,000 Units by mouth daily.    . famotidine (PEPCID) 20 MG tablet Take 1 tablet (20 mg total) by mouth 2 (two) times daily as needed for heartburn or indigestion. 180 tablet 3  . fluticasone (FLONASE) 50 MCG/ACT nasal spray Place 2 sprays into both nostrils as needed for allergies. 48 g 3  . levonorgestrel (MIRENA) 20 MCG/24HR IUD 1 each once by Intrauterine route.    . linaclotide (LINZESS) 145 MCG CAPS capsule Take 1 capsule by mouth 2 (two) times daily.    Marland Kitchen loratadine (CLARITIN) 10 MG tablet Take 1 tablet (10 mg total) by mouth daily. 90 tablet 3  . Multiple Vitamin (MULTIVITAMIN) tablet Take 1 tablet by mouth daily.    Marland Kitchen omeprazole (PRILOSEC) 20 MG capsule Take 20 mg by mouth daily.     No current facility-administered medications for this visit.     Family  History  Problem Relation Age of Onset  . Arthritis Mother   . Arthritis Father   . Alcohol abuse Other        Parents  . Arthritis Other        grandparents  . Diabetes Other        Parent & grandparent  . Hyperlipidemia Other        Parent, grandparent, other relative  . Hypertension Other        parent, grandparent, other relative  . Prostate cancer Other        grandfather  . Breast cancer Sister   . Breast cancer Sister   . Breast cancer Paternal Aunt   She has 8 sisters, 2 with breast cancer (one on her mom's side and one on her dad's side). One sister was 8, other was 110. PGM died of GYN cancer, at least 3 aunts on her dad's side with breast cancer.   Review of Systems  Constitutional: Negative.   HENT: Negative.   Eyes: Negative.   Respiratory: Negative.    Cardiovascular: Negative.   Gastrointestinal: Negative.   Endocrine: Negative.   Genitourinary: Negative.   Musculoskeletal: Negative.   Skin: Negative.   Allergic/Immunologic: Negative.   Neurological: Negative.   Hematological: Negative.   Psychiatric/Behavioral: Negative.     Exam:   BP 118/78 (BP Location: Right Arm, Patient Position: Sitting)   Pulse 72   Ht 5' 2.21" (1.58 m)   Wt 172 lb (78 kg)   BMI 31.25 kg/m   Weight change: @WEIGHTCHANGE @ Height:   Height: 5' 2.21" (158 cm)  Ht Readings from Last 3 Encounters:  02/27/18 5' 2.21" (1.58 m)  12/05/17 5\' 2"  (1.575 m)  11/07/17 5\' 2"  (1.575 m)    General appearance: alert, cooperative and appears stated age Head: Normocephalic, without obvious abnormality, atraumatic Neck: no adenopathy, supple, symmetrical, trachea midline and thyroid normal to inspection and palpation Lungs: clear to auscultation bilaterally Cardiovascular: regular rate and rhythm Breasts: normal appearance, no masses or tenderness Abdomen: soft, non-tender; non distended,  no masses,  no organomegaly Extremities: extremities normal, atraumatic, no cyanosis or edema Skin: Skin color, texture, turgor normal. No rashes or lesions Lymph nodes: Cervical, supraclavicular, and axillary nodes normal. No abnormal inguinal nodes palpated Neurologic: Grossly normal   Pelvic: External genitalia:  no lesions              Urethra:  normal appearing urethra with no masses, tenderness or lesions              Bartholins and Skenes: normal                 Vagina: normal appearing vagina with normal color and discharge, no lesions              Cervix: no lesions and IUD string 3 cm               Bimanual Exam:  Uterus:  normal size, contour, position, consistency, mobility, non-tender and retroverted              Adnexa: no mass, fullness, tenderness               Rectovaginal: Confirms               Anus:  normal sphincter tone, no lesions  Chaperone was  present for exam.  A:  Well Woman with normal exam  IUD check  Tolerable vasomotor symptoms  P:   No pap  this year  Referral to genetics  Mammogram UTD  Discussed breast self exam  Discussed calcium and vit D intake  Labs with primary  Next spring will check hormone levels prior to removal of mirena  TDAP

## 2018-02-27 ENCOUNTER — Ambulatory Visit: Payer: 59 | Admitting: Obstetrics and Gynecology

## 2018-02-27 ENCOUNTER — Encounter: Payer: Self-pay | Admitting: Obstetrics and Gynecology

## 2018-02-27 ENCOUNTER — Other Ambulatory Visit: Payer: Self-pay

## 2018-02-27 VITALS — BP 118/78 | HR 72 | Ht 62.21 in | Wt 172.0 lb

## 2018-02-27 DIAGNOSIS — Z803 Family history of malignant neoplasm of breast: Secondary | ICD-10-CM | POA: Diagnosis not present

## 2018-02-27 DIAGNOSIS — N951 Menopausal and female climacteric states: Secondary | ICD-10-CM | POA: Diagnosis not present

## 2018-02-27 DIAGNOSIS — Z30431 Encounter for routine checking of intrauterine contraceptive device: Secondary | ICD-10-CM

## 2018-02-27 DIAGNOSIS — Z01419 Encounter for gynecological examination (general) (routine) without abnormal findings: Secondary | ICD-10-CM

## 2018-02-27 DIAGNOSIS — Z23 Encounter for immunization: Secondary | ICD-10-CM

## 2018-02-27 NOTE — Patient Instructions (Signed)
EXERCISE AND DIET:  We recommended that you start or continue a regular exercise program for good health. Regular exercise means any activity that makes your heart beat faster and makes you sweat.  We recommend exercising at least 30 minutes per day at least 3 days a week, preferably 4 or 5.  We also recommend a diet low in fat and sugar.  Inactivity, poor dietary choices and obesity can cause diabetes, heart attack, stroke, and kidney damage, among others.    ALCOHOL AND SMOKING:  Women should limit their alcohol intake to no more than 7 drinks/beers/glasses of wine (combined, not each!) per week. Moderation of alcohol intake to this level decreases your risk of breast cancer and liver damage. And of course, no recreational drugs are part of a healthy lifestyle.  And absolutely no smoking or even second hand smoke. Most people know smoking can cause heart and lung diseases, but did you know it also contributes to weakening of your bones? Aging of your skin?  Yellowing of your teeth and nails?  CALCIUM AND VITAMIN D:  Adequate intake of calcium and Vitamin D are recommended.  The recommendations for exact amounts of these supplements seem to change often, but generally speaking 600 mg of calcium (either carbonate or citrate) and 800 units of Vitamin D per day seems prudent. Certain women may benefit from higher intake of Vitamin D.  If you are among these women, your doctor will have told you during your visit.    PAP SMEARS:  Pap smears, to check for cervical cancer or precancers,  have traditionally been done yearly, although recent scientific advances have shown that most women can have pap smears less often.  However, every woman still should have a physical exam from her gynecologist every year. It will include a breast check, inspection of the vulva and vagina to check for abnormal growths or skin changes, a visual exam of the cervix, and then an exam to evaluate the size and shape of the uterus and  ovaries.  And after 52 years of age, a rectal exam is indicated to check for rectal cancers. We will also provide age appropriate advice regarding health maintenance, like when you should have certain vaccines, screening for sexually transmitted diseases, bone density testing, colonoscopy, mammograms, etc.   MAMMOGRAMS:  All women over 40 years old should have a yearly mammogram. Many facilities now offer a "3D" mammogram, which may cost around $50 extra out of pocket. If possible,  we recommend you accept the option to have the 3D mammogram performed.  It both reduces the number of women who will be called back for extra views which then turn out to be normal, and it is better than the routine mammogram at detecting truly abnormal areas.    COLONOSCOPY:  Colonoscopy to screen for colon cancer is recommended for all women at age 50.  We know, you hate the idea of the prep.  We agree, BUT, having colon cancer and not knowing it is worse!!  Colon cancer so often starts as a polyp that can be seen and removed at colonscopy, which can quite literally save your life!  And if your first colonoscopy is normal and you have no family history of colon cancer, most women don't have to have it again for 10 years.  Once every ten years, you can do something that may end up saving your life, right?  We will be happy to help you get it scheduled when you are ready.    Be sure to check your insurance coverage so you understand how much it will cost.  It may be covered as a preventative service at no cost, but you should check your particular policy.      Breast Self-Awareness Breast self-awareness means being familiar with how your breasts look and feel. It involves checking your breasts regularly and reporting any changes to your health care provider. Practicing breast self-awareness is important. A change in your breasts can be a sign of a serious medical problem. Being familiar with how your breasts look and feel allows  you to find any problems early, when treatment is more likely to be successful. All women should practice breast self-awareness, including women who have had breast implants. How to do a breast self-exam One way to learn what is normal for your breasts and whether your breasts are changing is to do a breast self-exam. To do a breast self-exam: Look for Changes  1. Remove all the clothing above your waist. 2. Stand in front of a mirror in a room with good lighting. 3. Put your hands on your hips. 4. Push your hands firmly downward. 5. Compare your breasts in the mirror. Look for differences between them (asymmetry), such as: ? Differences in shape. ? Differences in size. ? Puckers, dips, and bumps in one breast and not the other. 6. Look at each breast for changes in your skin, such as: ? Redness. ? Scaly areas. 7. Look for changes in your nipples, such as: ? Discharge. ? Bleeding. ? Dimpling. ? Redness. ? A change in position. Feel for Changes  Carefully feel your breasts for lumps and changes. It is best to do this while lying on your back on the floor and again while sitting or standing in the shower or tub with soapy water on your skin. Feel each breast in the following way:  Place the arm on the side of the breast you are examining above your head.  Feel your breast with the other hand.  Start in the nipple area and make  inch (2 cm) overlapping circles to feel your breast. Use the pads of your three middle fingers to do this. Apply light pressure, then medium pressure, then firm pressure. The light pressure will allow you to feel the tissue closest to the skin. The medium pressure will allow you to feel the tissue that is a little deeper. The firm pressure will allow you to feel the tissue close to the ribs.  Continue the overlapping circles, moving downward over the breast until you feel your ribs below your breast.  Move one finger-width toward the center of the body.  Continue to use the  inch (2 cm) overlapping circles to feel your breast as you move slowly up toward your collarbone.  Continue the up and down exam using all three pressures until you reach your armpit.  Write Down What You Find  Write down what is normal for each breast and any changes that you find. Keep a written record with breast changes or normal findings for each breast. By writing this information down, you do not need to depend only on memory for size, tenderness, or location. Write down where you are in your menstrual cycle, if you are still menstruating. If you are having trouble noticing differences in your breasts, do not get discouraged. With time you will become more familiar with the variations in your breasts and more comfortable with the exam. How often should I examine my breasts? Examine   your breasts every month. If you are breastfeeding, the best time to examine your breasts is after a feeding or after using a breast pump. If you menstruate, the best time to examine your breasts is 5-7 days after your period is over. During your period, your breasts are lumpier, and it may be more difficult to notice changes. When should I see my health care provider? See your health care provider if you notice:  A change in shape or size of your breasts or nipples.  A change in the skin of your breast or nipples, such as a reddened or scaly area.  Unusual discharge from your nipples.  A lump or thick area that was not there before.  Pain in your breasts.  Anything that concerns you.  This information is not intended to replace advice given to you by your health care provider. Make sure you discuss any questions you have with your health care provider. Document Released: 07/24/2005 Document Revised: 12/30/2015 Document Reviewed: 06/13/2015 Elsevier Interactive Patient Education  2018 Elsevier Inc.  

## 2018-04-01 ENCOUNTER — Telehealth: Payer: Self-pay | Admitting: Obstetrics and Gynecology

## 2018-04-01 NOTE — Telephone Encounter (Signed)
Received message from referral coordinator at [facility] that patient has not returned multiple calls from their office to schedule referral that you recommended. I placed a call today as well to try to reach the patient. The referral has been closed by [facility] as of today.   Routing to provider for final review. Will close encounter.

## 2018-04-23 ENCOUNTER — Encounter: Payer: Self-pay | Admitting: Genetic Counselor

## 2018-04-23 ENCOUNTER — Telehealth: Payer: Self-pay | Admitting: Genetic Counselor

## 2018-04-23 NOTE — Telephone Encounter (Signed)
A genetic counseling appt has been scheduled for the pt to see Roma Kayser on 10/14 at 9am. Pt aware to arrive 15 minutes early. Letter mailed.

## 2018-05-13 ENCOUNTER — Encounter: Payer: Self-pay | Admitting: Physical Therapy

## 2018-05-14 DIAGNOSIS — H10413 Chronic giant papillary conjunctivitis, bilateral: Secondary | ICD-10-CM | POA: Diagnosis not present

## 2018-05-20 ENCOUNTER — Inpatient Hospital Stay: Payer: 59 | Admitting: Licensed Clinical Social Worker

## 2018-05-20 ENCOUNTER — Inpatient Hospital Stay: Payer: 59

## 2018-06-04 DIAGNOSIS — H10413 Chronic giant papillary conjunctivitis, bilateral: Secondary | ICD-10-CM | POA: Diagnosis not present

## 2018-06-26 DIAGNOSIS — K219 Gastro-esophageal reflux disease without esophagitis: Secondary | ICD-10-CM | POA: Diagnosis not present

## 2018-06-26 DIAGNOSIS — K5900 Constipation, unspecified: Secondary | ICD-10-CM | POA: Diagnosis not present

## 2018-06-27 ENCOUNTER — Ambulatory Visit (INDEPENDENT_AMBULATORY_CARE_PROVIDER_SITE_OTHER): Payer: 59 | Admitting: *Deleted

## 2018-06-27 DIAGNOSIS — Z23 Encounter for immunization: Secondary | ICD-10-CM | POA: Diagnosis not present

## 2018-08-09 DIAGNOSIS — H10413 Chronic giant papillary conjunctivitis, bilateral: Secondary | ICD-10-CM | POA: Diagnosis not present

## 2018-08-19 ENCOUNTER — Other Ambulatory Visit: Payer: Self-pay | Admitting: Internal Medicine

## 2018-08-20 NOTE — Telephone Encounter (Signed)
Called pharmacy last refill 12/26/2017 with 30tabs LOV: 12/05/2017 cpe NOV: none

## 2018-08-29 DIAGNOSIS — M9907 Segmental and somatic dysfunction of upper extremity: Secondary | ICD-10-CM | POA: Diagnosis not present

## 2018-08-29 DIAGNOSIS — M9902 Segmental and somatic dysfunction of thoracic region: Secondary | ICD-10-CM | POA: Diagnosis not present

## 2018-08-29 DIAGNOSIS — M9901 Segmental and somatic dysfunction of cervical region: Secondary | ICD-10-CM | POA: Diagnosis not present

## 2018-09-03 DIAGNOSIS — M9907 Segmental and somatic dysfunction of upper extremity: Secondary | ICD-10-CM | POA: Diagnosis not present

## 2018-09-03 DIAGNOSIS — M9902 Segmental and somatic dysfunction of thoracic region: Secondary | ICD-10-CM | POA: Diagnosis not present

## 2018-09-03 DIAGNOSIS — M9901 Segmental and somatic dysfunction of cervical region: Secondary | ICD-10-CM | POA: Diagnosis not present

## 2018-09-06 DIAGNOSIS — K912 Postsurgical malabsorption, not elsewhere classified: Secondary | ICD-10-CM | POA: Diagnosis not present

## 2018-09-06 DIAGNOSIS — Z903 Acquired absence of stomach [part of]: Secondary | ICD-10-CM | POA: Diagnosis not present

## 2018-09-11 DIAGNOSIS — M9902 Segmental and somatic dysfunction of thoracic region: Secondary | ICD-10-CM | POA: Diagnosis not present

## 2018-09-11 DIAGNOSIS — M9901 Segmental and somatic dysfunction of cervical region: Secondary | ICD-10-CM | POA: Diagnosis not present

## 2018-09-11 DIAGNOSIS — M9907 Segmental and somatic dysfunction of upper extremity: Secondary | ICD-10-CM | POA: Diagnosis not present

## 2018-09-13 DIAGNOSIS — K912 Postsurgical malabsorption, not elsewhere classified: Secondary | ICD-10-CM | POA: Diagnosis not present

## 2018-09-13 DIAGNOSIS — Z903 Acquired absence of stomach [part of]: Secondary | ICD-10-CM | POA: Diagnosis not present

## 2018-09-26 DIAGNOSIS — Z6832 Body mass index (BMI) 32.0-32.9, adult: Secondary | ICD-10-CM | POA: Diagnosis not present

## 2018-09-26 DIAGNOSIS — Z713 Dietary counseling and surveillance: Secondary | ICD-10-CM | POA: Diagnosis not present

## 2018-09-26 DIAGNOSIS — Z903 Acquired absence of stomach [part of]: Secondary | ICD-10-CM | POA: Diagnosis not present

## 2018-11-04 DIAGNOSIS — H10413 Chronic giant papillary conjunctivitis, bilateral: Secondary | ICD-10-CM | POA: Diagnosis not present

## 2019-01-28 ENCOUNTER — Other Ambulatory Visit: Payer: Self-pay | Admitting: Internal Medicine

## 2019-01-28 DIAGNOSIS — Z1231 Encounter for screening mammogram for malignant neoplasm of breast: Secondary | ICD-10-CM

## 2019-02-11 ENCOUNTER — Other Ambulatory Visit (INDEPENDENT_AMBULATORY_CARE_PROVIDER_SITE_OTHER): Payer: 59

## 2019-02-11 ENCOUNTER — Other Ambulatory Visit: Payer: Self-pay

## 2019-02-11 ENCOUNTER — Ambulatory Visit (INDEPENDENT_AMBULATORY_CARE_PROVIDER_SITE_OTHER): Payer: 59 | Admitting: Internal Medicine

## 2019-02-11 ENCOUNTER — Other Ambulatory Visit: Payer: Self-pay | Admitting: Internal Medicine

## 2019-02-11 ENCOUNTER — Encounter: Payer: Self-pay | Admitting: Internal Medicine

## 2019-02-11 VITALS — BP 130/78 | HR 55 | Temp 98.0°F | Ht 62.21 in | Wt 193.0 lb

## 2019-02-11 DIAGNOSIS — F419 Anxiety disorder, unspecified: Secondary | ICD-10-CM | POA: Diagnosis not present

## 2019-02-11 DIAGNOSIS — Z7189 Other specified counseling: Secondary | ICD-10-CM | POA: Diagnosis not present

## 2019-02-11 DIAGNOSIS — R21 Rash and other nonspecific skin eruption: Secondary | ICD-10-CM | POA: Diagnosis not present

## 2019-02-11 DIAGNOSIS — Z0000 Encounter for general adult medical examination without abnormal findings: Secondary | ICD-10-CM

## 2019-02-11 LAB — COMPREHENSIVE METABOLIC PANEL
ALT: 11 U/L (ref 0–35)
AST: 13 U/L (ref 0–37)
Albumin: 4.2 g/dL (ref 3.5–5.2)
Alkaline Phosphatase: 108 U/L (ref 39–117)
BUN: 14 mg/dL (ref 6–23)
CO2: 31 mEq/L (ref 19–32)
Calcium: 9.1 mg/dL (ref 8.4–10.5)
Chloride: 103 mEq/L (ref 96–112)
Creatinine, Ser: 1.1 mg/dL (ref 0.40–1.20)
GFR: 62.9 mL/min (ref >60.00)
Glucose, Bld: 78 mg/dL (ref 70–99)
Potassium: 4.3 mEq/L (ref 3.5–5.1)
Sodium: 139 mEq/L (ref 135–145)
Total Bilirubin: 0.5 mg/dL (ref 0.2–1.2)
Total Protein: 7.2 g/dL (ref 6.0–8.3)

## 2019-02-11 LAB — LIPID PANEL
Cholesterol: 199 mg/dL (ref 0–200)
HDL: 85.8 mg/dL (ref >39.00)
LDL Cholesterol: 96 mg/dL (ref 0–99)
NonHDL: 112.84
Total CHOL/HDL Ratio: 2
Triglycerides: 83 mg/dL (ref 0.0–149.0)
VLDL: 16.6 mg/dL (ref 0.0–40.0)

## 2019-02-11 LAB — HEMOGLOBIN A1C: Hgb A1c MFr Bld: 5.4 % (ref 4.6–6.5)

## 2019-02-11 LAB — CBC
HCT: 39 % (ref 36.0–46.0)
Hemoglobin: 12.7 g/dL (ref 12.0–15.0)
MCHC: 32.5 g/dL (ref 30.0–36.0)
MCV: 82.2 fl (ref 78.0–100.0)
Platelets: 308 10*3/uL (ref 150.0–400.0)
RBC: 4.75 Mil/uL (ref 3.87–5.11)
RDW: 14.8 % (ref 11.5–15.5)
WBC: 7.1 10*3/uL (ref 4.0–10.5)

## 2019-02-11 LAB — TSH: TSH: 1.08 u[IU]/mL (ref 0.35–4.50)

## 2019-02-11 MED ORDER — ALPRAZOLAM 0.5 MG PO TABS: ORAL_TABLET | ORAL | 0 refills | Status: DC

## 2019-02-11 MED ORDER — NYSTATIN-TRIAMCINOLONE 100000-0.1 UNIT/GM-% EX OINT: 1.0000 "application " | TOPICAL_OINTMENT | Freq: Two times a day (BID) | CUTANEOUS | 0 refills | Status: DC

## 2019-02-11 NOTE — Progress Notes (Signed)
   Subjective:   Patient ID: Alicia Chen, female    DOB: 13-Apr-1966, 53 y.o.   MRN: [id number]  HPI The patient is a 53 YO female coming in for rash on chest. Started about 2 weeks ago. Has tried various ointment over the counter for this which have not helped including for ringworm, itch, cortisone. Overall it is stable. Denies fevers or chills. Denies SOB or cough. Is having more stress with covid-19 outbreak and needs refill on xanax. Has mild anxiety and is out of this. Does not use often but helps when used. Denies depression although she is trying to help avoid this. Exercising and meditation some. Wants routine labs. Also has questions about covid-19.  Review of Systems  Constitutional: Negative.   HENT: Negative.   Eyes: Negative.   Respiratory: Negative for cough, chest tightness and shortness of breath.   Cardiovascular: Negative for chest pain, palpitations and leg swelling.  Gastrointestinal: Negative for abdominal distention, abdominal pain, constipation, diarrhea, nausea and vomiting.  Musculoskeletal: Negative.   Skin: Positive for rash.  Neurological: Negative.   Psychiatric/Behavioral: Negative.     Objective:  Physical Exam Constitutional:      Appearance: She is well-developed.  HENT:     Head: Normocephalic and atraumatic.  Neck:     Musculoskeletal: Normal range of motion.  Cardiovascular:     Rate and Rhythm: Normal rate and regular rhythm.  Pulmonary:     Effort: Pulmonary effort is normal. No respiratory distress.     Breath sounds: Normal breath sounds. No wheezing or rales.  Abdominal:     General: Bowel sounds are normal. There is no distension.     Palpations: Abdomen is soft.     Tenderness: There is no abdominal tenderness. There is no rebound.  Skin:    General: Skin is warm and dry.     Findings: Rash present.     Comments: Yeast infection on skin above left breast, not consistent with cancer breast  Neurological:     Mental Status: She is alert  and oriented to person, place, and time.     Coordination: Coordination normal.     Vitals:   02/11/19 1352  BP: 130/78  Pulse: (!) 55  Temp: 98 F (36.7 C)  TempSrc: Oral  SpO2: 99%  Weight: 193 lb (87.5 kg)  Height: 5' 2.21" (1.58 m)    Assessment & Plan:

## 2019-02-11 NOTE — Assessment & Plan Note (Signed)
Refill xanax which she uses rarely. Using exercise and meditation to help her cope as well.

## 2019-02-11 NOTE — Assessment & Plan Note (Signed)
Questions answered and masking and social distancing reinforced.

## 2019-02-11 NOTE — Patient Instructions (Signed)
We will check the labs today and call you back about the results.   We have refilled the xanax and sent in the cream to use twice a day for the rash.

## 2019-02-11 NOTE — Assessment & Plan Note (Signed)
Rx nystatin/triamcinolone ointment for the rash which is likely candidal.  °

## 2019-02-11 NOTE — Telephone Encounter (Signed)
Control database checked last refill: 08/20/2018 LOV: 12/05/2017 [id number]

## 2019-02-13 ENCOUNTER — Encounter: Payer: Self-pay | Admitting: Internal Medicine

## 2019-02-14 MED ORDER — NYSTATIN 100000 UNIT/GM EX OINT: 1.0000 "application " | TOPICAL_OINTMENT | Freq: Two times a day (BID) | CUTANEOUS | 0 refills | Status: DC

## 2019-03-04 ENCOUNTER — Other Ambulatory Visit: Payer: Self-pay

## 2019-03-04 ENCOUNTER — Ambulatory Visit (INDEPENDENT_AMBULATORY_CARE_PROVIDER_SITE_OTHER): Payer: 59 | Admitting: Internal Medicine

## 2019-03-04 ENCOUNTER — Encounter: Payer: Self-pay | Admitting: Internal Medicine

## 2019-03-04 DIAGNOSIS — Z20828 Contact with and (suspected) exposure to other viral communicable diseases: Secondary | ICD-10-CM | POA: Diagnosis not present

## 2019-03-04 DIAGNOSIS — R51 Headache: Secondary | ICD-10-CM

## 2019-03-04 DIAGNOSIS — Z20822 Contact with and (suspected) exposure to covid-19: Secondary | ICD-10-CM

## 2019-03-04 DIAGNOSIS — J3489 Other specified disorders of nose and nasal sinuses: Secondary | ICD-10-CM

## 2019-03-04 MED ORDER — SUMATRIPTAN SUCCINATE 50 MG PO TABS: 50.0000 mg | ORAL_TABLET | ORAL | 0 refills | Status: DC | PRN

## 2019-03-04 NOTE — Progress Notes (Signed)
Virtual Visit via Video Note  I connected with Alicia Chen on 03/04/19 at  1:40 PM EDT by a video enabled telemedicine application and verified that I am speaking with the correct person using two identifiers.  The patient and the provider were at separate locations throughout the entire encounter.   I discussed the limitations of evaluation and management by telemedicine and the availability of in person appointments. The patient expressed understanding and agreed to proceed.  History of Present Illness: The patient is a 53 y.o. female with visit for sinus pain and headaches. Started yesterday. Has headaches and feeling poorly with stomach upset yesterday. Denies fevers but had chills last night. She has had 2-3 employees at work test positive and several more are currently getting tested. She denies diarrhea. She denies SOB or coughing. She does have asthma concurrent. Still taking symbicort and still taking. Overall it is not improving. Has tried tylenol which normally helps her headaches but this has not helped.  Observations/Objective: Appearance: normal, breathing appears normal, pain to frontal sinuses to self palpation, casual grooming, abdomen does not appear distended, throat with drainage, mental status is A and O times 3  Assessment and Plan: See problem oriented charting  Follow Up Instructions: covid-19 testing, sumatriptan for headache, quarantine at home  Visit time 25 minutes: greater than 50% of that time was spent in face to face counseling and coordination of care with the patient: counseled about covid-19 and need for testing, quarantine until results return, as well as expected course and management  I discussed the assessment and treatment plan with the patient. The patient was provided an opportunity to ask questions and all were answered. The patient agreed with the plan and demonstrated an understanding of the instructions.   The patient was advised to call back or seek  an in-person evaluation if the symptoms worsen or if the condition fails to improve as anticipated.  Hoyt Koch, MD

## 2019-03-04 NOTE — Assessment & Plan Note (Signed)
Ordered testing for covid-19 and off work til then. Rx sumatriptan for her headache. Advised if SOB call back or seek care in the ER.

## 2019-03-06 LAB — NOVEL CORONAVIRUS, NAA: SARS-CoV-2, NAA: NOT DETECTED

## 2019-03-06 NOTE — Progress Notes (Signed)
53 y.o. [id number] Divorced Black or African American Not Hispanic or Latino female here for annual exam.  She has a mirena IUD that is due for removal. No bleeding. Partner with ED, can't take the ED medications secondary to cardiac issues.  She lives with her 53 year old fiance. He has multiple medical issue (heart disease, kidney disease, diabetes). He had to start dialysis and is on the list for a kidney transplant, trying to get disability.   No cycles. She is having hot flashes and night sweats. Symptoms have been worsening. She is up 1 x a night with sweats.   H/O gastric sleeve in 2/17, lost 150 lbs, gained 31 lbs in the last 2 years. She was seen at [facility], wanting help with weight loss. They recommended hormone pellets for her. She didn't want to do this until she discussed it with me.     No LMP recorded. (Menstrual status: IUD).          Sexually active: Yes.    The current method of family planning is IUD.    Exercising: Yes.    walking every day Smoker:  no  Health Maintenance: Pap:02-14-16 WNL NEG HR HPV History of abnormal Pap:no MMG:03/11/2019 Birads 0 needs right diagnostic mammogram and ultrasound, patient is not aware yet  Colonoscopy:01/2017 WNL per patient, f/u in 10 years LZJ:QBHAL TDaP:02/27/2018 Gardasil:NA   reports that she has never smoked. She has never used smokeless tobacco. She reports current alcohol use. She reports that she does not use drugs. 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 She has twin daughters, 8. They live with each other here in [place].   Past Medical History:  Diagnosis Date  . ALLERGIC RHINITIS   . ANEMIA-NOS   . ARTHRITIS   . ASTHMA   . DEPRESSION   . Fibroid   . GERD   . MIGRAINE HEADACHE   . OBESITY, MORBID   . Vitamin D deficiency     Past Surgical History:  Procedure Laterality Date  . CHOLECYSTECTOMY  2009  . ESOPHAGOGASTRODUODENOSCOPY N/A 12/23/2012    Procedure: ESOPHAGOGASTRODUODENOSCOPY (EGD);  Surgeon: Juanita Craver, MD;  Location: [facility] END[facility]COPY;  Service: Endoscopy;  Laterality: N/A;  . INTRAUTERINE DEVICE (IUD) INSERTION  12-15-13   Mirena  . [place] RESECTION  2/17  . ROOT CANAL    . Skin graft surgery (R) leg     childhood    Current Outpatient Medications  Medication Sig Dispense Refill  . albuterol (VENTOLIN HFA) 108 (90 Base) MCG/ACT inhaler Inhale 1-2 puffs into the lungs every 4 (four) hours as needed for wheezing or shortness of breath. 18 g 1  . ALPRAZolam (XANAX) 0.5 MG tablet TAKE 1 TABLET(0.5 MG) BY MOUTH DAILY AS NEEDED FOR ANXIETY 30 tablet 0  . budesonide-formoterol (SYMBICORT) 160-4.5 MCG/ACT inhaler Inhale 2 puffs into the lungs 2 (two) times daily. 1 Inhaler 0  . Cholecalciferol (VITAMIN D) 2000 units tablet Take 2,000 Units by mouth daily.    . Cyanocobalamin (B-12 COMPLIANCE INJECTION IJ) Inject as directed. Once a week    . famotidine (PEPCID) 20 MG tablet Take 1 tablet (20 mg total) by mouth 2 (two) times daily as needed for heartburn or indigestion. 180 tablet 3  . fluticasone (FLONASE) 50 MCG/ACT nasal spray Place 2 sprays into both nostrils as needed for allergies. 48 g 3  . levonorgestrel (MIRENA) 20 MCG/24HR IUD 1 each once by Intrauterine route.    Marland Kitchen  linaclotide (LINZESS) 145 MCG CAPS capsule Take 1 capsule by mouth 2 (two) times daily.    Marland Kitchen loratadine (CLARITIN) 10 MG tablet Take 1 tablet (10 mg total) by mouth daily. 90 tablet 3  . Multiple Vitamin (MULTIVITAMIN) tablet Take 1 tablet by mouth daily.    Marland Kitchen nystatin ointment (MYCOSTATIN) Apply 1 application topically 2 (two) times daily. 100 g 0  . omeprazole (PRILOSEC) 20 MG capsule Take 20 mg by mouth daily.    . Prasterone, DHEA, 50 MG CAPS Take by mouth.    . SUMAtriptan (IMITREX) 50 MG tablet Take 1 tablet (50 mg total) by mouth every 2 (two) hours as needed for migraine. May repeat in 2 hours if headache persists or recurs. 10  tablet 0   No current facility-administered medications for this visit.     Family History  Problem Relation Age of Onset  . Arthritis Mother   . Arthritis Father   . Alcohol abuse Other        Parents  . Arthritis Other        grandparents  . Diabetes Other        Parent & grandparent  . Hyperlipidemia Other        Parent, grandparent, other relative  . Hypertension Other        parent, grandparent, other relative  . Prostate cancer Other        grandfather  . Breast cancer Sister   . Breast cancer Sister   . Breast cancer Paternal Aunt   One sister was in her 23's, other sister was 57.  She has 8 sisters, 2 with breast cancer (one on her mom's side and one on her dad's side). One sister was 46, other was 76. PGM died of GYN cancer, at least 3 aunts on her dad's side with breast cancer  Review of Systems  Constitutional: Negative.   HENT: Negative.   Eyes: Negative.   Respiratory: Negative.   Cardiovascular: Negative.   Endocrine: Negative.   Genitourinary:       Bleeding with penetration  Musculoskeletal: Negative.   Skin: Positive for rash.  Allergic/Immunologic: Negative.   Neurological: Negative.   Hematological: Negative.   Psychiatric/Behavioral: Negative.   One episode of bleeding with vaginal penetration (partner with ED, he used a toy), was slightly uncomfortable, thinks she may have torn.   Exam:   BP 122/82 (BP Location: Right Arm, Patient Position: Sitting, Cuff Size: Large)   Pulse 80   Temp 97.8 F (36.6 C) (Skin)   Ht 5\' 2"  (1.575 m)   Wt 193 lb (87.5 kg)   BMI 35.30 kg/m   Weight change: @WEIGHTCHANGE @ Height:   Height: 5\' 2"  (157.5 cm)  Ht Readings from Last 3 Encounters:  03/12/19 5\' 2"  (1.575 m)  02/11/19 5' 2.21" (1.58 m)  02/27/18 5' 2.21" (1.58 m)    General appearance: alert, cooperative and appears stated age Head: Normocephalic, without obvious abnormality, atraumatic Neck: no adenopathy, supple, symmetrical, trachea midline and  thyroid normal to inspection and palpation Lungs: clear to auscultation bilaterally Cardiovascular: regular rate and rhythm Breasts: normal appearance, no masses or tenderness Abdomen: soft, non-tender; non distended,  no masses,  no organomegaly Extremities: extremities normal, atraumatic, no cyanosis or edema Skin: Skin color, texture, turgor normal. No rashes or lesions Lymph nodes: Cervical, supraclavicular, and axillary nodes normal. No abnormal inguinal nodes palpated Neurologic: Grossly normal   Pelvic: External genitalia:  no lesions  Urethra:  normal appearing urethra with no masses, tenderness or lesions              Bartholins and Skenes: normal                 Vagina: normal appearing vagina with normal color and discharge, no lesions              Cervix: no lesions, IUD string seen. Attempted to remove the IUD with the ring forceps, stuck, wouldn't come out and painful to the patient with attempted removal.                Bimanual Exam:  Uterus:  normal size, contour, position, consistency, mobility, non-tender, retroverted.               Adnexa: no mass, fullness, tenderness               Rectovaginal: Confirms               Anus:  normal sphincter tone, no lesions  Chaperone was present for exam.  A:  Well Woman with normal exam  Perimenopausal vs menopausal, having vasomotor symptoms  FH of breast cancer  IUD due for removal, unable to remove  P:   No pap this year  Recommended she avoid triggers and try over the counter medication for her vasomotor symptoms. If that doesn't help will return to discuss options  Information on menopause and HRT given  Will return for GYN ultrasound, needs IUD removed. Further plans after ultrasound.  Cyclic provera until no bleeding x 6 months  Labs with primary  Mammogram just done, needs to go back for more images  Colonoscopy UTD  Referral for genetics placed (she didn't go last year)

## 2019-03-11 ENCOUNTER — Ambulatory Visit
Admission: RE | Admit: 2019-03-11 | Discharge: 2019-03-11 | Disposition: A | Discharge status: Home / Self Care | Payer: 59 | Source: Ambulatory Visit | Attending: Internal Medicine | Admitting: Internal Medicine

## 2019-03-11 ENCOUNTER — Other Ambulatory Visit: Payer: Self-pay

## 2019-03-11 DIAGNOSIS — Z1231 Encounter for screening mammogram for malignant neoplasm of breast: Secondary | ICD-10-CM

## 2019-03-12 ENCOUNTER — Other Ambulatory Visit: Payer: Self-pay | Admitting: Internal Medicine

## 2019-03-12 ENCOUNTER — Ambulatory Visit: Payer: 59 | Admitting: Obstetrics and Gynecology

## 2019-03-12 ENCOUNTER — Encounter: Payer: Self-pay | Admitting: Internal Medicine

## 2019-03-12 ENCOUNTER — Other Ambulatory Visit: Payer: Self-pay

## 2019-03-12 ENCOUNTER — Encounter: Payer: Self-pay | Admitting: Obstetrics and Gynecology

## 2019-03-12 VITALS — BP 122/82 | HR 80 | Temp 97.8°F | Ht 62.0 in | Wt 193.0 lb

## 2019-03-12 DIAGNOSIS — Z803 Family history of malignant neoplasm of breast: Secondary | ICD-10-CM

## 2019-03-12 DIAGNOSIS — Z538 Procedure and treatment not carried out for other reasons: Secondary | ICD-10-CM | POA: Diagnosis not present

## 2019-03-12 DIAGNOSIS — T8339XA Other mechanical complication of intrauterine contraceptive device, initial encounter: Secondary | ICD-10-CM

## 2019-03-12 DIAGNOSIS — Z01419 Encounter for gynecological examination (general) (routine) without abnormal findings: Secondary | ICD-10-CM | POA: Diagnosis not present

## 2019-03-12 DIAGNOSIS — R928 Other abnormal and inconclusive findings on diagnostic imaging of breast: Secondary | ICD-10-CM

## 2019-03-12 DIAGNOSIS — Z975 Presence of (intrauterine) contraceptive device: Secondary | ICD-10-CM

## 2019-03-12 DIAGNOSIS — N951 Menopausal and female climacteric states: Secondary | ICD-10-CM

## 2019-03-12 MED ORDER — MEDROXYPROGESTERONE ACETATE 5 MG PO TABS: ORAL_TABLET | ORAL | 1 refills | Status: DC

## 2019-03-12 NOTE — Patient Instructions (Addendum)
EXERCISE AND DIET:  We recommended that you start or continue a regular exercise program for good health. Regular exercise means any activity that makes your heart beat faster and makes you sweat.  We recommend exercising at least 30 minutes per day at least 3 days a week, preferably 4 or 5.  We also recommend a diet low in fat and sugar.  Inactivity, poor dietary choices and obesity can cause diabetes, heart attack, stroke, and kidney damage, among others.    ALCOHOL AND SMOKING:  Women should limit their alcohol intake to no more than 7 drinks/beers/glasses of wine (combined, not each!) per week. Moderation of alcohol intake to this level decreases your risk of breast cancer and liver damage. And of course, no recreational drugs are part of a healthy lifestyle.  And absolutely no smoking or even second hand smoke. Most people know smoking can cause heart and lung diseases, but did you know it also contributes to weakening of your bones? Aging of your skin?  Yellowing of your teeth and nails?  CALCIUM AND VITAMIN D:  Adequate intake of calcium and Vitamin D are recommended.  The recommendations for exact amounts of these supplements seem to change often, but generally speaking 1,200 mg of calcium (between diet and supplement) and 800 units of Vitamin D per day seems prudent. Certain women may benefit from higher intake of Vitamin D.  If you are among these women, your doctor will have told you during your visit.    PAP SMEARS:  Pap smears, to check for cervical cancer or precancers,  have traditionally been done yearly, although recent scientific advances have shown that most women can have pap smears less often.  However, every woman still should have a physical exam from her gynecologist every year. It will include a breast check, inspection of the vulva and vagina to check for abnormal growths or skin changes, a visual exam of the cervix, and then an exam to evaluate the size and shape of the uterus and  ovaries.  And after 53 years of age, a rectal exam is indicated to check for rectal cancers. We will also provide age appropriate advice regarding health maintenance, like when you should have certain vaccines, screening for sexually transmitted diseases, bone density testing, colonoscopy, mammograms, etc.   MAMMOGRAMS:  All women over 40 years old should have a yearly mammogram. Many facilities now offer a "3D" mammogram, which may cost around $50 extra out of pocket. If possible,  we recommend you accept the option to have the 3D mammogram performed.  It both reduces the number of women who will be called back for extra views which then turn out to be normal, and it is better than the routine mammogram at detecting truly abnormal areas.    COLON CANCER SCREENING: Now recommend starting at age 45. At this time colonoscopy is not covered for routine screening until 50. There are take home tests that can be done between 45-49.   COLONOSCOPY:  Colonoscopy to screen for colon cancer is recommended for all women at age 50.  We know, you hate the idea of the prep.  We agree, BUT, having colon cancer and not knowing it is worse!!  Colon cancer so often starts as a polyp that can be seen and removed at colonscopy, which can quite literally save your life!  And if your first colonoscopy is normal and you have no family history of colon cancer, most women don't have to have it again for   10 years.  Once every ten years, you can do something that may end up saving your life, right?  We will be happy to help you get it scheduled when you are ready.  Be sure to check your insurance coverage so you understand how much it will cost.  It may be covered as a preventative service at no cost, but you should check your particular policy.      Breast Self-Awareness Breast self-awareness means being familiar with how your breasts look and feel. It involves checking your breasts regularly and reporting any changes to your  health care provider. Practicing breast self-awareness is important. A change in your breasts can be a sign of a serious medical problem. Being familiar with how your breasts look and feel allows you to find any problems early, when treatment is more likely to be successful. All women should practice breast self-awareness, including women who have had breast implants. How to do a breast self-exam One way to learn what is normal for your breasts and whether your breasts are changing is to do a breast self-exam. To do a breast self-exam: Look for Changes  1. Remove all the clothing above your waist. 2. Stand in front of a mirror in a room with good lighting. 3. Put your hands on your hips. 4. Push your hands firmly downward. 5. Compare your breasts in the mirror. Look for differences between them (asymmetry), such as: ? Differences in shape. ? Differences in size. ? Puckers, dips, and bumps in one breast and not the other. 6. Look at each breast for changes in your skin, such as: ? Redness. ? Scaly areas. 7. Look for changes in your nipples, such as: ? Discharge. ? Bleeding. ? Dimpling. ? Redness. ? A change in position. Feel for Changes Carefully feel your breasts for lumps and changes. It is best to do this while lying on your back on the floor and again while sitting or standing in the shower or tub with soapy water on your skin. Feel each breast in the following way:  Place the arm on the side of the breast you are examining above your head.  Feel your breast with the other hand.  Start in the nipple area and make  inch (2 cm) overlapping circles to feel your breast. Use the pads of your three middle fingers to do this. Apply light pressure, then medium pressure, then firm pressure. The light pressure will allow you to feel the tissue closest to the skin. The medium pressure will allow you to feel the tissue that is a little deeper. The firm pressure will allow you to feel the tissue  close to the ribs.  Continue the overlapping circles, moving downward over the breast until you feel your ribs below your breast.  Move one finger-width toward the center of the body. Continue to use the  inch (2 cm) overlapping circles to feel your breast as you move slowly up toward your collarbone.  Continue the up and down exam using all three pressures until you reach your armpit.  Write Down What You Find  Write down what is normal for each breast and any changes that you find. Keep a written record with breast changes or normal findings for each breast. By writing this information down, you do not need to depend only on memory for size, tenderness, or location. Write down where you are in your menstrual cycle, if you are still menstruating. If you are having trouble noticing differences   in your breasts, do not get discouraged. With time you will become more familiar with the variations in your breasts and more comfortable with the exam. How often should I examine my breasts? Examine your breasts every month. If you are breastfeeding, the best time to examine your breasts is after a feeding or after using a breast pump. If you menstruate, the best time to examine your breasts is 5-7 days after your period is over. During your period, your breasts are lumpier, and it may be more difficult to notice changes. When should I see my health care provider? See your health care provider if you notice:  A change in shape or size of your breasts or nipples.  A change in the skin of your breast or nipples, such as a reddened or scaly area.  Unusual discharge from your nipples.  A lump or thick area that was not there before.  Pain in your breasts.  Anything that concerns you.   Menopause and Hormone Replacement Therapy Menopause is a normal time of life when menstrual periods stop completely and the ovaries stop producing the female hormones estrogen and progesterone. This lack of hormones  can affect your health and cause undesirable symptoms. Hormone replacement therapy (HRT) can relieve some of those symptoms. What is hormone replacement therapy? HRT is the use of artificial (synthetic) hormones to replace hormones that your body has stopped producing because you have reached menopause. What are my options for HRT?  HRT may consist of the synthetic hormones estrogen and progestin, or it may consist of only estrogen (estrogen-only therapy). You and your health care provider will decide which form of HRT is best for you. If you choose to be on HRT and you have a uterus, estrogen and progestin are usually prescribed. Estrogen-only therapy is used for women who do not have a uterus. Possible options for taking HRT include:  Pills.  Patches.  Gels.  Sprays.  Vaginal cream.  Vaginal rings.  Vaginal inserts. The amount of hormone(s) that you take and how long you take the hormone(s) varies according to your health. It is important to:  Begin HRT with the lowest possible dosage.  Stop HRT as soon as your health care provider tells you to stop.  Work with your health care provider so that you feel informed and comfortable with your decisions. What are the benefits of HRT? HRT can reduce the frequency and severity of menopausal symptoms. Benefits of HRT vary according to the kind of symptoms that you have, how severe they are, and your overall health. HRT may help to improve the following symptoms of menopause:  Hot flashes and night sweats. These are sudden feelings of heat that spread over the face and body. The skin may turn red, like a blush. Night sweats are hot flashes that happen while you are sleeping or trying to sleep.  Bone loss (osteoporosis). The body loses calcium more quickly after menopause, causing the bones to become weaker. This can increase the risk for bone breaks (fractures).  Vaginal dryness. The lining of the vagina can become thin and dry, which can  cause pain during sex or cause infection, burning, or itching.  Urinary tract infections.  Urinary incontinence. This is the inability to control when you pass urine.  Irritability.  Short-term memory problems. What are the risks of HRT? Risks of HRT vary depending on your individual health and medical history. Risks of HRT also depend on whether you receive both estrogen and progestin or  you receive estrogen only. HRT may increase the risk of:  Spotting. This is when a small amount of blood leaks from the vagina unexpectedly.  Endometrial cancer. This cancer is in the lining of the uterus (endometrium).  Breast cancer.  Increased density of breast tissue. This can make it harder to find breast cancer on a breast X-ray (mammogram).  Stroke.  Heart disease.  Blood clots.  Gallbladder disease.  Liver disease. Risks of HRT can increase if you have any of the following conditions:  Endometrial cancer.  Liver disease.  Heart disease.  Breast cancer.  History of blood clots.  History of stroke. Follow these instructions at home:  Take over-the-counter and prescription medicines only as told by your health care provider.  Get mammograms, pelvic exams, and medical checkups as often as told by your health care provider.  Have Pap tests done as often as told by your health care provider. A Pap test is sometimes called a Pap smear. It is a screening test that is used to check for signs of cancer of the cervix and vagina. A Pap test can also identify the presence of infection or precancerous changes. Pap tests may be done: ? Every 3 years, starting at age 48. ? Every 5 years, starting after age 30, in combination with testing for human papillomavirus (HPV). ? More often or less often depending on other medical conditions you have, your age, and other risk factors.  It is up to you to get the results of your Pap test. Ask your health care provider, or the department that is  doing the test, when your results will be ready.  Keep all follow-up visits as told by your health care provider. This is important. Contact a health care provider if you have:  Pain or swelling in your legs.  Shortness of breath.  Chest pain.  Lumps or changes in your breasts or armpits.  Slurred speech.  Pain, burning, or bleeding when you urinate.  Unusual vaginal bleeding.  Dizziness or headaches.  Weakness or numbness in any part of your arms or legs.  Pain in your abdomen. Summary  Menopause is a normal time of life when menstrual periods stop completely and the ovaries stop producing the female hormones estrogen and progesterone.  Hormone replacement therapy (HRT) can relieve some of the symptoms of menopause.  HRT can reduce the frequency and severity of menopausal symptoms.  Risks of HRT vary depending on your individual health and medical history. This information is not intended to replace advice given to you by your health care provider. Make sure you discuss any questions you have with your health care provider. Document Released: 04/22/2003 Document Revised: 03/26/2018 Document Reviewed: 03/26/2018 Elsevier Patient Education  2020 [employer].  Menopause Menopause is the normal time of life when menstrual periods stop completely. It is usually confirmed by 12 months without a menstrual period. The transition to menopause (perimenopause) most often happens between the ages of 9 and 21. During perimenopause, hormone levels change in your body, which can cause symptoms and affect your health. Menopause may increase your risk for:  Loss of bone (osteoporosis), which causes bone breaks (fractures).  Depression.  Hardening and narrowing of the arteries (atherosclerosis), which can cause heart attacks and strokes. What are the causes? This condition is usually caused by a natural change in hormone levels that happens as you get older. The condition may also be  caused by surgery to remove both ovaries (bilateral oophorectomy). What increases the  risk? This condition is more likely to start at an earlier age if you have certain medical conditions or treatments, including:  A tumor of the pituitary gland in the brain.  A disease that affects the ovaries and hormone production.  Radiation treatment for cancer.  Certain cancer treatments, such as chemotherapy or hormone (anti-estrogen) therapy.  Heavy smoking and excessive alcohol use.  Family history of early menopause. This condition is also more likely to develop earlier in women who are very thin. What are the signs or symptoms? Symptoms of this condition include:  Hot flashes.  Irregular menstrual periods.  Night sweats.  Changes in feelings about sex. This could be a decrease in sex drive or an increased comfort around your sexuality.  Vaginal dryness and thinning of the vaginal walls. This may cause painful intercourse.  Dryness of the skin and development of wrinkles.  Headaches.  Problems sleeping (insomnia).  Mood swings or irritability.  Memory problems.  Weight gain.  Hair growth on the face and chest.  Bladder infections or problems with urinating. How is this diagnosed? This condition is diagnosed based on your medical history, a physical exam, your age, your menstrual history, and your symptoms. Hormone tests may also be done. How is this treated? In some cases, no treatment is needed. You and your health care provider should make a decision together about whether treatment is necessary. Treatment will be based on your individual condition and preferences. Treatment for this condition focuses on managing symptoms. Treatment may include:  Menopausal hormone therapy (MHT).  Medicines to treat specific symptoms or complications.  Acupuncture.  Vitamin or herbal supplements. Before starting treatment, make sure to let your health care provider know if you have  a personal or family history of:  Heart disease.  Breast cancer.  Blood clots.  Diabetes.  Osteoporosis. Follow these instructions at home: Lifestyle  Do not use any products that contain nicotine or tobacco, such as cigarettes and e-cigarettes. If you need help quitting, ask your health care provider.  Get at least 30 minutes of physical activity on 5 or more days each week.  Avoid alcoholic and caffeinated beverages, as well as spicy foods. This may help prevent hot flashes.  Get 7-8 hours of sleep each night.  If you have hot flashes, try: ? Dressing in layers. ? Avoiding things that may trigger hot flashes, such as spicy food, warm places, or stress. ? Taking slow, deep breaths when a hot flash starts. ? Keeping a fan in your home and office.  Find ways to manage stress, such as deep breathing, meditation, or journaling.  Consider going to group therapy with other women who are having menopause symptoms. Ask your health care provider about recommended group therapy meetings. Eating and drinking  Eat a healthy, balanced diet that contains whole grains, lean protein, low-fat dairy, and plenty of fruits and vegetables.  Your health care provider may recommend adding more soy to your diet. Foods that contain soy include tofu, tempeh, and soy milk.  Eat plenty of foods that contain calcium and vitamin D for bone health. Items that are rich in calcium include low-fat milk, yogurt, beans, almonds, sardines, broccoli, and kale. Medicines  Take over-the-counter and prescription medicines only as told by your health care provider.  Talk with your health care provider before starting any herbal supplements. If prescribed, take vitamins and supplements as told by your health care provider. These may include: ? Calcium. Women age 34 and older should get 1,200  mg (milligrams) of calcium every day. ? Vitamin D. Women need 600-800 International Units of vitamin D each day. ? Vitamins  B12 and B6. Aim for 50 micrograms of B12 and 1.5 mg of B6 each day. General instructions  Keep track of your menstrual periods, including: ? When they occur. ? How heavy they are and how long they last. ? How much time passes between periods.  Keep track of your symptoms, noting when they start, how often you have them, and how long they last.  Use vaginal lubricants or moisturizers to help with vaginal dryness and improve comfort during sex.  Keep all follow-up visits as told by your health care provider. This is important. This includes any group therapy or counseling. Contact a health care provider if:  You are still having menstrual periods after age 53.  You have pain during sex.  You have not had a period for 12 months and you develop vaginal bleeding. Get help right away if:  You have: ? Severe depression. ? Excessive vaginal bleeding. ? Pain when you urinate. ? A fast or irregular heart beat (palpitations). ? Severe headaches. ? Abdomen (abdominal) pain or severe indigestion.  You fell and you think you have a broken bone.  You develop leg or chest pain.  You develop vision problems.  You feel a lump in your breast. Summary  Menopause is the normal time of life when menstrual periods stop completely. It is usually confirmed by 12 months without a menstrual period.  The transition to menopause (perimenopause) most often happens between the ages of 23 and 100.  Symptoms can be managed through medicines, lifestyle changes, and complementary therapies such as acupuncture.  Eat a balanced diet that is rich in nutrients to promote bone health and heart health and to manage symptoms during menopause. This information is not intended to replace advice given to you by your health care provider. Make sure you discuss any questions you have with your health care provider. Document Released: 10/14/2003 Document Revised: 07/06/2017 Document Reviewed: 08/26/2016 Elsevier  Patient Education  2020 [employer].

## 2019-03-13 ENCOUNTER — Telehealth: Payer: Self-pay | Admitting: Obstetrics and Gynecology

## 2019-03-13 ENCOUNTER — Ambulatory Visit: Payer: 59

## 2019-03-13 ENCOUNTER — Ambulatory Visit
Admission: RE | Admit: 2019-03-13 | Discharge: 2019-03-13 | Disposition: A | Discharge status: Home / Self Care | Payer: 59 | Source: Ambulatory Visit | Attending: Internal Medicine | Admitting: Internal Medicine

## 2019-03-13 DIAGNOSIS — R928 Other abnormal and inconclusive findings on diagnostic imaging of breast: Secondary | ICD-10-CM

## 2019-03-13 MED ORDER — FLUCONAZOLE 150 MG PO TABS: 150.0000 mg | ORAL_TABLET | ORAL | 0 refills | Status: AC

## 2019-03-13 NOTE — Telephone Encounter (Signed)
Call placed to patient to review benefit for ultrasound with IUD removal. Left voicemail message requesting a return call

## 2019-03-18 ENCOUNTER — Other Ambulatory Visit: Payer: Self-pay

## 2019-03-18 NOTE — Progress Notes (Signed)
GYNECOLOGY  VISIT   HPI: 53 y.o.   Divorced Black or [place] American Not Hispanic or Latino  female   [phone] with No LMP recorded. Patient is perimenopausal.   here for US guided IUD removal. An attempt was made to remove her IUD at her annual exam last week. The IUD strings were seen, but unable to remove the IUD with moderate traction. The patient was also very uncomfortable with attempted removal. Her for ultrasound to check IUD location and another attempt at removal  GYNECOLOGIC HISTORY: No LMP recorded. Patient is perimenopausal. Contraception: IUD Menopausal hormone therapy: none        OB History    Gravida  2   Para  2   Term  1   Preterm  1   AB      Living  2     SAB      TAB      Ectopic      Multiple  2   Live Births  4              Patient Active Problem List   Diagnosis Date Noted  . Suspected Covid-19 Virus Infection 03/04/2019  . Anxiety 02/11/2019  . Educated About Covid-19 Virus Infection 02/11/2019  . Rash 11/07/2017  . Elevated LFTs 12/04/2016  . Routine general medical examination at a health care facility 07/11/2016  . Trigger thumb of left hand 04/05/2015  . OSA (obstructive sleep apnea) 02/05/2013  . Obesity 09/26/2010  . MIGRAINE HEADACHE 02/09/2010  . ALLERGIC RHINITIS 02/09/2010  . Asthma 02/09/2010  . GERD 02/09/2010  . Arthritis 02/09/2010    Past Medical History:  Diagnosis Date  . ALLERGIC RHINITIS   . ANEMIA-NOS   . ARTHRITIS   . ASTHMA   . DEPRESSION   . Fibroid   . GERD   . MIGRAINE HEADACHE   . OBESITY, MORBID   . Vitamin D deficiency     Past Surgical History:  Procedure Laterality Date  . CHOLECYSTECTOMY  2009  . ESOPHAGOGASTRODUODENOSCOPY N/A 12/23/2012   Procedure: ESOPHAGOGASTRODUODENOSCOPY (EGD);  Surgeon: Juanita Craver, MD;  Location: [facility];  Service: Endoscopy;  Laterality: N/A;  . INTRAUTERINE DEVICE (IUD) INSERTION  12-15-13   Mirena  . [place] RESECTION  2/17  .  ROOT CANAL    . Skin graft surgery (R) leg     childhood    Current Outpatient Medications  Medication Sig Dispense Refill  . albuterol (VENTOLIN HFA) 108 (90 Base) MCG/ACT inhaler Inhale 1-2 puffs into the lungs every 4 (four) hours as needed for wheezing or shortness of breath. 18 g 1  . ALPRAZolam (XANAX) 0.5 MG tablet TAKE 1 TABLET(0.5 MG) BY MOUTH DAILY AS NEEDED FOR ANXIETY 30 tablet 0  . budesonide-formoterol (SYMBICORT) 160-4.5 MCG/ACT inhaler Inhale 2 puffs into the lungs 2 (two) times daily. 1 Inhaler 0  . Cholecalciferol (VITAMIN D) 2000 units tablet Take 2,000 Units by mouth daily.    . Cyanocobalamin (B-12 COMPLIANCE INJECTION IJ) Inject as directed. Once a week    . famotidine (PEPCID) 20 MG tablet Take 1 tablet (20 mg total) by mouth 2 (two) times daily as needed for heartburn or indigestion. 180 tablet 3  . fluconazole (DIFLUCAN) 150 MG tablet Take 1 tablet (150 mg total) by mouth every 3 (three) days for 3 doses. 3 tablet 0  . linaclotide (LINZESS) 145 MCG CAPS capsule Take 1 capsule by mouth 2 (two) times daily.    Marland Kitchen loratadine (CLARITIN)  10 MG tablet Take 1 tablet (10 mg total) by mouth daily. 90 tablet 3  . medroxyPROGESTERone (PROVERA) 5 MG tablet Take one tablet a day for 5 days every other month until no bleeding x 6 months. 15 tablet 1  . Multiple Vitamin (MULTIVITAMIN) tablet Take 1 tablet by mouth daily.    Marland Kitchen nystatin ointment (MYCOSTATIN) Apply 1 application topically 2 (two) times daily. 100 g 0  . omeprazole (PRILOSEC) 20 MG capsule Take 20 mg by mouth daily.    . phentermine 15 MG capsule Take 1 capsule by mouth daily.    . Prasterone, DHEA, 50 MG CAPS Take by mouth.    . SUMAtriptan (IMITREX) 50 MG tablet Take 1 tablet (50 mg total) by mouth every 2 (two) hours as needed for migraine. May repeat in 2 hours if headache persists or recurs. 10 tablet 0   No current facility-administered medications for this visit.      ALLERGIES: Penicillins  Family History   Problem Relation Age of Onset  . Arthritis Mother   . Arthritis Father   . Alcohol abuse Other        Parents  . Arthritis Other        grandparents  . Diabetes Other        Parent & grandparent  . Hyperlipidemia Other        Parent, grandparent, other relative  . Hypertension Other        parent, grandparent, other relative  . Prostate cancer Other        grandfather  . Breast cancer Sister   . Breast cancer Sister   . Breast cancer Paternal Aunt     Social History   Socioeconomic History  . Marital status: Divorced    Spouse name: Not on file  . Number of children: Not on file  . Years of education: Not on file  . Highest education level: Not on file  Occupational History  . Occupation: [employer]    Employer: [employer]  Social Needs  . Financial resource strain: Not on file  . Food insecurity    Worry: Not on file    Inability: Not on file  . Transportation needs    Medical: Not on file    Non-medical: Not on file  Tobacco Use  . Smoking status: Never Smoker  . Smokeless tobacco: Never Used  Substance and Sexual Activity  . Alcohol use: Yes    Comment: Social  . Drug use: No  . Sexual activity: Yes    Partners: Male    Birth control/protection: Condom  Lifestyle  . Physical activity    Days per week: Not on file    Minutes per session: Not on file  . Stress: Not on file  Relationships  . Social Herbalist on phone: Not on file    Gets together: Not on file    Attends religious service: Not on file    Active member of club or organization: Not on file    Attends meetings of clubs or organizations: Not on file    Relationship status: Not on file  . Intimate partner violence    Fear of current or ex partner: Not on file    Emotionally abused: Not on file    Physically abused: Not on file    Forced sexual activity: Not on file  Other Topics Concern  . 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   HENT: Negative.   Eyes: Negative.   Respiratory: Negative.   Cardiovascular: Negative.   Gastrointestinal: Negative.   Genitourinary: Negative.   Musculoskeletal: Negative.   Skin: Negative.   Neurological: Negative.   Endo/Heme/Allergies: Negative.   Psychiatric/Behavioral: Negative.   All other systems reviewed and are negative.  Ultrasound: multiple small myoma's, IUD appears to be in the cavity.   PHYSICAL EXAMINATION:    BP 130/80   Pulse 84   Temp 97.9 F (36.6 C) (Temporal)   Ht 5\' 2"  (1.575 m)   Wt 191 lb (86.6 kg)   BMI 34.93 kg/m     General appearance: alert, cooperative and appears stated age  Pelvic: External genitalia:  no lesions              Urethra:  normal appearing urethra with no masses, tenderness or lesions              Bartholins and Skenes: normal                 Vagina: normal appearing vagina with normal color and discharge, no lesions              Cervix: no lesions, IUD string 4 cm. Abdominal ultrasound was used during IUD removal, unable to see clearly via abdominal probe. The IUD was grasped with ringed forceps at the external cervical os. Moderate traction was applied, the string extended out of the os slightly. Another ring forcep was placed above the first one and the IUD was removed intact. The patient tolerated the procedure well.    Chaperone was present for exam.  ASSESSMENT IUD embedded, appeared in the uterine cavity on ultrasound Fibroid uterus Perimenopausal vs postmenopausal    PLAN IUD removed intact with repeated attempt Discussed fibroids, information given. Reviewed that after menopause fibroids should shrink. Discussed cyclic provera (already prescribed) If she resumes menses and they are heavy she may need further treatment.    An After Visit Summary was printed and given to the patient.

## 2019-03-20 ENCOUNTER — Encounter: Payer: Self-pay | Admitting: Obstetrics and Gynecology

## 2019-03-20 ENCOUNTER — Other Ambulatory Visit: Payer: Self-pay

## 2019-03-20 ENCOUNTER — Ambulatory Visit (INDEPENDENT_AMBULATORY_CARE_PROVIDER_SITE_OTHER): Payer: 59

## 2019-03-20 ENCOUNTER — Ambulatory Visit: Payer: 59 | Admitting: Obstetrics and Gynecology

## 2019-03-20 VITALS — BP 130/80 | HR 84 | Temp 97.9°F | Ht 62.0 in | Wt 191.0 lb

## 2019-03-20 DIAGNOSIS — Z538 Procedure and treatment not carried out for other reasons: Secondary | ICD-10-CM | POA: Diagnosis not present

## 2019-03-20 DIAGNOSIS — D259 Leiomyoma of uterus, unspecified: Secondary | ICD-10-CM

## 2019-03-20 DIAGNOSIS — T8339XA Other mechanical complication of intrauterine contraceptive device, initial encounter: Secondary | ICD-10-CM

## 2019-03-20 DIAGNOSIS — Z975 Presence of (intrauterine) contraceptive device: Secondary | ICD-10-CM | POA: Diagnosis not present

## 2019-03-20 DIAGNOSIS — Z30432 Encounter for removal of intrauterine contraceptive device: Secondary | ICD-10-CM | POA: Diagnosis not present

## 2019-03-20 DIAGNOSIS — N951 Menopausal and female climacteric states: Secondary | ICD-10-CM | POA: Diagnosis not present

## 2019-03-20 NOTE — Patient Instructions (Signed)

## 2019-03-20 NOTE — Progress Notes (Signed)
    Addendum; left ovary - 2.35 x 1.69 x 1.01 cm                     Right ovary - 2.34 x 1.57 x 0.81 cm

## 2019-04-04 ENCOUNTER — Telehealth: Payer: Self-pay | Admitting: *Deleted

## 2019-04-04 NOTE — Telephone Encounter (Signed)
Patient in MMG hold.   03/11/19: Screening MMG at [facility], right breast mass  03/13/19: Dx MMG right breast completed at [facility]; negative, screening MMG 1 year  Dr. Talbert Nan -ok to remove from [facility] hold?

## 2019-04-07 NOTE — Telephone Encounter (Signed)
Yes, please remove from mammogram hold.

## 2019-04-07 NOTE — Telephone Encounter (Signed)
Patient removed from MMG hold.   Encounter closed.  

## 2019-05-22 ENCOUNTER — Other Ambulatory Visit: Payer: Self-pay

## 2019-05-22 ENCOUNTER — Ambulatory Visit (INDEPENDENT_AMBULATORY_CARE_PROVIDER_SITE_OTHER): Payer: 59

## 2019-05-22 DIAGNOSIS — Z23 Encounter for immunization: Secondary | ICD-10-CM

## 2019-06-16 ENCOUNTER — Other Ambulatory Visit: Payer: Self-pay | Admitting: Internal Medicine

## 2019-07-07 ENCOUNTER — Other Ambulatory Visit: Payer: Self-pay | Admitting: Internal Medicine

## 2019-07-07 ENCOUNTER — Encounter: Payer: Self-pay | Admitting: Internal Medicine

## 2019-07-07 ENCOUNTER — Other Ambulatory Visit: Payer: Self-pay | Admitting: Family

## 2019-07-07 NOTE — Telephone Encounter (Signed)
Control database checked last refill: 02/11/2019 30 tabs LOV:02/11/19 [id number]

## 2019-07-08 MED ORDER — ALPRAZOLAM 0.5 MG PO TABS: ORAL_TABLET | ORAL | 0 refills | Status: DC

## 2019-07-08 MED ORDER — BUDESONIDE-FORMOTEROL FUMARATE 160-4.5 MCG/ACT IN AERO: 2.0000 | INHALATION_SPRAY | Freq: Two times a day (BID) | RESPIRATORY_TRACT | 0 refills | Status: DC

## 2019-07-18 ENCOUNTER — Encounter: Payer: Self-pay | Admitting: Obstetrics and Gynecology

## 2019-10-21 ENCOUNTER — Other Ambulatory Visit: Payer: Self-pay | Admitting: Internal Medicine

## 2019-10-21 NOTE — Telephone Encounter (Signed)
Alprazolam 0.5 Mg Tablet  Checked controlled substance database. Last filled:07/08/2019 Last office visit: 07/07/2019 Next Office visit: N/S

## 2019-11-21 ENCOUNTER — Other Ambulatory Visit: Payer: Self-pay

## 2019-11-21 ENCOUNTER — Ambulatory Visit: Payer: 59 | Admitting: Podiatry

## 2019-11-21 VITALS — Temp 98.3°F

## 2019-11-21 DIAGNOSIS — B351 Tinea unguium: Secondary | ICD-10-CM | POA: Diagnosis not present

## 2019-11-21 DIAGNOSIS — L603 Nail dystrophy: Secondary | ICD-10-CM

## 2019-11-29 LAB — HEPATIC FUNCTION PANEL
AG Ratio: 1.6 (calc) (ref 1.0–2.5)
ALT: 9 U/L (ref 6–29)
AST: 14 U/L (ref 10–35)
Albumin: 4.1 g/dL (ref 3.6–5.1)
Alkaline phosphatase (APISO): 117 U/L (ref 37–153)
Bilirubin, Direct: 0.1 mg/dL (ref 0.0–0.2)
Globulin: 2.5 g/dL (calc) (ref 1.9–3.7)
Indirect Bilirubin: 0.3 mg/dL (calc) (ref 0.2–1.2)
Total Bilirubin: 0.4 mg/dL (ref 0.2–1.2)
Total Protein: 6.6 g/dL (ref 6.1–8.1)

## 2019-12-08 ENCOUNTER — Telehealth: Payer: Self-pay | Admitting: Podiatry

## 2019-12-08 MED ORDER — TERBINAFINE HCL 250 MG PO TABS: 250.0000 mg | ORAL_TABLET | Freq: Every day | ORAL | 1 refills | Status: DC

## 2019-12-08 NOTE — Progress Notes (Signed)
  Subjective:  Patient ID: Alicia Chen, female    DOB: [birth year],  MRN: [id number]  Chief Complaint  Patient presents with  . Nail Problem    Bilateral hallux. Pt stated, "I've been diagnosed and treated for nail fungus. Lamisil (tablets) helped. I don't want to treatment to affect my liver. I'm interested in learning more about topical therapy and/or laser treatment".  . Ingrown Toenail    Possible ingrown toenails - L hallux lateral border and R hallux medial border. Pt stated, "The L toenail is very tender. No bleeding or pus".    54 y.o. female presents with the above complaint. History confirmed with patient.   Objective:  Physical Exam: warm, good capillary refill, nail exam onychomycosis of the toenails bilateral hallux, no trophic changes or ulcerative lesions, normal DP and PT pulses and normal sensory exam. Left Foot: normal exam, no swelling, tenderness, instability; ligaments intact, full range of motion of all ankle/foot joints  Right Foot: normal exam, no swelling, tenderness, instability; ligaments intact, full range of motion of all ankle/foot joints   Assessment:   1. Onychomycosis   2. Nail dystrophy      Plan:  Patient was evaluated and treated and all questions answered.  Onychomycosis -Educated on etiology of nail fungus. -Baseline liver function studies ordered. Will d/c terbinafine if elevated during therapy. -eRx for oral terbinafine #30. Educated on risks and benefits of the medication.     No follow-ups on file.

## 2019-12-08 NOTE — Telephone Encounter (Signed)
Called pt lvm stating pt can call set appt at her convience 12/08/19

## 2019-12-08 NOTE — Telephone Encounter (Signed)
Left message informing pt of Dr. Eleanora Neighbor orders and I would have schedulers contact her to schedule in that timeframe.

## 2019-12-08 NOTE — Telephone Encounter (Signed)
Can you inform her her LFTs were normal - I sent lamisil and we will have her come back in 6 weeks for recheck of the liver tests. Can we get her appt

## 2019-12-08 NOTE — Telephone Encounter (Signed)
Patient was wondering if you were going to send a RX to the Pharmacy for her. She had labs done, and has not heard anything about the results.

## 2019-12-25 ENCOUNTER — Ambulatory Visit: Payer: 59 | Admitting: Internal Medicine

## 2019-12-25 ENCOUNTER — Other Ambulatory Visit: Payer: Self-pay

## 2019-12-25 ENCOUNTER — Encounter: Payer: Self-pay | Admitting: Internal Medicine

## 2019-12-25 DIAGNOSIS — J0110 Acute frontal sinusitis, unspecified: Secondary | ICD-10-CM | POA: Diagnosis not present

## 2019-12-25 MED ORDER — MECLIZINE HCL 25 MG PO TABS: 25.0000 mg | ORAL_TABLET | Freq: Three times a day (TID) | ORAL | 0 refills | Status: DC | PRN

## 2019-12-25 MED ORDER — SUMATRIPTAN SUCCINATE 50 MG PO TABS: 50.0000 mg | ORAL_TABLET | ORAL | 5 refills | Status: AC | PRN

## 2019-12-25 MED ORDER — AZITHROMYCIN 250 MG PO TABS: ORAL_TABLET | ORAL | 0 refills | Status: DC

## 2019-12-25 MED ORDER — OMEPRAZOLE 20 MG PO CPDR: 20.0000 mg | DELAYED_RELEASE_CAPSULE | Freq: Every day | ORAL | 3 refills | Status: DC

## 2019-12-25 NOTE — Patient Instructions (Signed)
We have sent in azithromycin to take 2 pills daily, then 1 pill daily starting tomorrow until gone.  We have sent in meclizine to use for vertigo if needed.

## 2019-12-25 NOTE — Progress Notes (Signed)
   Subjective:   Patient ID: Alicia Chen, female    DOB: Mar 23, 1966, 54 y.o.   MRN: [id number]  HPI The patient is a 54 YO female coming in for concerns about headaches. Started with painting at work about 1-2 weeks ago. She got a lot of headaches and sinus pressure. These were not her typical migraine location or duration. Was out of imitrex and did not call for refill since it was not her normal migraine. Took otc medication which helped for a short time but then headache and sinus pressure returned. She is taking otc allergy medication. This week she has had progressive sinus symptoms of drainage and nose crusting. Denies SOB or cough. She does have a lot of popping and pressure in both ears as well. Overall not improving and worsening. She does have a history of migraines. Denies fevers or chills. Denies loss of taste/smell. Is vaccinated against covid-19.  Review of Systems  Constitutional: Negative.   HENT: Positive for congestion, ear pain, sinus pressure and sinus pain. Negative for dental problem, drooling, ear discharge, sore throat and trouble swallowing.   Eyes: Negative.   Respiratory: Negative for cough, chest tightness and shortness of breath.   Cardiovascular: Negative for chest pain, palpitations and leg swelling.  Gastrointestinal: Negative for abdominal distention, abdominal pain, constipation, diarrhea, nausea and vomiting.  Musculoskeletal: Negative.   Skin: Negative.   Neurological: Positive for headaches.  Psychiatric/Behavioral: Negative.     Objective:  Physical Exam Constitutional:      Appearance: She is well-developed.  HENT:     Head: Normocephalic and atraumatic.     Comments: Oropharynx with clear drainage, nose with yellow crusting, frontal sinus pain on exam, TMs bulging clear fluid Cardiovascular:     Rate and Rhythm: Normal rate and regular rhythm.  Pulmonary:     Effort: Pulmonary effort is normal. No respiratory distress.     Breath sounds: Normal  breath sounds. No wheezing or rales.  Abdominal:     General: Bowel sounds are normal. There is no distension.     Palpations: Abdomen is soft.     Tenderness: There is no abdominal tenderness. There is no rebound.  Musculoskeletal:     Cervical back: Normal range of motion.  Skin:    General: Skin is warm and dry.  Neurological:     Mental Status: She is alert and oriented to person, place, and time.     Coordination: Coordination normal.     Vitals:   12/25/19 1341  BP: 134/82  Pulse: 80  Temp: 98.2 F (36.8 C)  SpO2: 98%  Weight: 211 lb (95.7 kg)  Height: 5\' 2"  (1.575 m)    This visit occurred during the SARS-CoV-2 public health emergency.  Safety protocols were in place, including screening questions prior to the visit, additional usage of staff PPE, and extensive cleaning of exam room while observing appropriate contact time as indicated for disinfecting solutions.   Assessment & Plan:

## 2019-12-26 NOTE — Assessment & Plan Note (Signed)
Rx azithromycin due to pcn allergy. Advised to continue with otc allergy medication as well. Refilled imitrex which she can try also.

## 2020-01-22 ENCOUNTER — Telehealth: Payer: Self-pay | Admitting: *Deleted

## 2020-01-22 DIAGNOSIS — Z79899 Other long term (current) drug therapy: Secondary | ICD-10-CM

## 2020-01-22 NOTE — Telephone Encounter (Signed)
Pt called asked if needed an appt or if needed to repeat blood work to take Lamisil.

## 2020-02-10 NOTE — Telephone Encounter (Signed)
I informed pt of Dr. Eleanora Neighbor orders and that she could have them drawn at her convenience.

## 2020-02-10 NOTE — Telephone Encounter (Signed)
Yes please order

## 2020-02-26 ENCOUNTER — Ambulatory Visit (INDEPENDENT_AMBULATORY_CARE_PROVIDER_SITE_OTHER): Payer: 59 | Admitting: Podiatry

## 2020-02-26 DIAGNOSIS — Z5329 Procedure and treatment not carried out because of patient's decision for other reasons: Secondary | ICD-10-CM

## 2020-02-26 LAB — HEPATIC FUNCTION PANEL
AG Ratio: 1.2 (calc) (ref 1.0–2.5)
ALT: 11 U/L (ref 6–29)
AST: 15 U/L (ref 10–35)
Albumin: 4 g/dL (ref 3.6–5.1)
Alkaline phosphatase (APISO): 127 U/L (ref 37–153)
Bilirubin, Direct: 0.1 mg/dL (ref 0.0–0.2)
Globulin: 3.3 g/dL (calc) (ref 1.9–3.7)
Indirect Bilirubin: 0.3 mg/dL (calc) (ref 0.2–1.2)
Total Bilirubin: 0.4 mg/dL (ref 0.2–1.2)
Total Protein: 7.3 g/dL (ref 6.1–8.1)

## 2020-02-26 NOTE — Progress Notes (Signed)
No show for appt. 

## 2020-02-27 ENCOUNTER — Telehealth: Payer: Self-pay | Admitting: Podiatry

## 2020-02-27 MED ORDER — TERBINAFINE HCL 250 MG PO TABS: 250.0000 mg | ORAL_TABLET | Freq: Every day | ORAL | 0 refills | Status: DC

## 2020-02-27 NOTE — Telephone Encounter (Signed)
I informed pt of Dr. Eleanora Neighbor orders.

## 2020-02-27 NOTE — Telephone Encounter (Signed)
Attempted to call patient to let her know the LFTs were normal. No answer, no VM left.  Refilled fluconazole

## 2020-03-17 NOTE — Progress Notes (Signed)
54 y.o. G60P1102 Divorced Black or [place] American Not Hispanic or Latino female here for annual exam.   She lives with her fiance. He has multiple medical problems on dialysis, awaiting a kidney transplant. Not sexually active secondary to ED (can't take the medications for ED).   No vaginal bleeding. She is having vasomotor symptoms, lately not as bad. She has been avoiding triggers and is doing better.   Stressful year, fiance ill, daughter ill, mom needs help. Stress at work. She is taking care of everybody. Trying to stay positive, thinks she probably is depressed. Some anxiety. She does have xanax, a 30 day supply lasts 2 months.     No LMP recorded. (Menstrual status: Perimenopausal).          Sexually active: Yes.    The current method of family planning is post menopausal status.    Exercising: Yes.    3-4 days a week  Smoker:  no  Health Maintenance: Pap:  02-14-16 neg HPV H Rneg History of abnormal Pap:  no MMG:  03-13-2019 category c density birads 1:neg BMD:  08/31/17 WNL Colonoscopy: 2018 f/u 31yrs TDaP:  2019 Gardasil: NA   reports that she has never smoked. She has never used smokeless tobacco. She reports current alcohol use. She reports that she does not use drugs. 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 Work is stressful. She has twin daughters, 59. One of her daughters just got diagnosed with Crohn's disease. They live with each other here in [place]  Past Medical History:  Diagnosis Date  . ALLERGIC RHINITIS   . ANEMIA-NOS   . ARTHRITIS   . ASTHMA   . DEPRESSION   . Fibroid   . GERD   . MIGRAINE HEADACHE   . OBESITY, MORBID   . Vitamin D deficiency     Past Surgical History:  Procedure Laterality Date  . CHOLECYSTECTOMY  2009  . ESOPHAGOGASTRODUODENOSCOPY N/A 12/23/2012   Procedure: ESOPHAGOGASTRODUODENOSCOPY (EGD);  Surgeon: Juanita Craver, MD;  Location: [facility];  Service: Endoscopy;   Laterality: N/A;  . INTRAUTERINE DEVICE (IUD) INSERTION  12-15-13   Mirena  . [place] RESECTION  2/17  . ROOT CANAL    . Skin graft surgery (R) leg     childhood    Current Outpatient Medications  Medication Sig Dispense Refill  . albuterol (VENTOLIN HFA) 108 (90 Base) MCG/ACT inhaler Inhale 1-2 puffs into the lungs every 4 (four) hours as needed for wheezing or shortness of breath. 18 g 1  . ALPRAZolam (XANAX) 0.5 MG tablet TAKE 1 TABLET(0.5 MG) BY MOUTH DAILY AS NEEDED FOR ANXIETY 30 tablet 0  . budesonide-formoterol (SYMBICORT) 160-4.5 MCG/ACT inhaler Inhale 2 puffs into the lungs 2 (two) times daily. 1 Inhaler 0  . Cholecalciferol (VITAMIN D) 2000 units tablet Take 2,000 Units by mouth daily.    . ciclopirox (LOPROX) 0.77 % cream APPLY TOPICALLY TO THE AFFECTED AREA TWICE DAILY    . famotidine (PEPCID) 20 MG tablet Take 1 tablet (20 mg total) by mouth 2 (two) times daily as needed for heartburn or indigestion. 180 tablet 3  . linaclotide (LINZESS) 145 MCG CAPS capsule Take 1 capsule by mouth 2 (two) times daily.    Marland Kitchen loratadine (CLARITIN) 10 MG tablet Take 1 tablet (10 mg total) by mouth daily. 90 tablet 3  . meclizine (ANTIVERT) 25 MG tablet Take 1 tablet (25 mg total) by mouth  3 (three) times daily as needed for dizziness. 30 tablet 0  . Multiple Vitamin (MULTIVITAMIN) tablet Take 1 tablet by mouth daily.    Marland Kitchen nystatin ointment (MYCOSTATIN) APPLY TOPICALLY TWICE DAILY 90 g 3  . omeprazole (PRILOSEC) 20 MG capsule Take 1 capsule (20 mg total) by mouth daily. 90 capsule 3  . phentermine 15 MG capsule Take 1 capsule by mouth daily.    . SUMAtriptan (IMITREX) 50 MG tablet Take 1 tablet (50 mg total) by mouth every 2 (two) hours as needed for migraine. May repeat in 2 hours if headache persists or recurs. 10 tablet 5  . terbinafine (LAMISIL) 250 MG tablet Take 1 tablet (250 mg total) by mouth daily. 30 tablet 0   No current facility-administered medications for this  visit.    Family History  Problem Relation Age of Onset  . Arthritis Mother   . Arthritis Father   . Alcohol abuse Other        Parents  . Arthritis Other        grandparents  . Diabetes Other        Parent & grandparent  . Hyperlipidemia Other        Parent, grandparent, other relative  . Hypertension Other        parent, grandparent, other relative  . Prostate cancer Other        grandfather  . Breast cancer Sister   . Breast cancer Sister   . Breast cancer Paternal Aunt   One sister was in her 46's, other sister was 15.  She has 8 sisters, 2 with breast cancer (one on her mom's side and one on her dad's side). One sister was 18, other was 9. PGM died of GYN cancer, at least 3 aunts on her dad's side with breast cancer  Review of Systems  Gastrointestinal: Positive for constipation.  Genitourinary: Negative for flank pain.  All other systems reviewed and are negative.   Exam:   BP 118/64   Pulse 85   Ht 5\' 2"  (1.575 m)   Wt 212 lb 9.6 oz (96.4 kg)   SpO2 95%   BMI 38.89 kg/m   Weight change: @WEIGHTCHANGE @ Height:   Height: 5\' 2"  (157.5 cm)  Ht Readings from Last 3 Encounters:  03/18/20 5\' 2"  (1.575 m)  12/25/19 5\' 2"  (1.575 m)  03/20/19 5\' 2"  (1.575 m)    General appearance: alert, cooperative and appears stated age Head: Normocephalic, without obvious abnormality, atraumatic Neck: no adenopathy, supple, symmetrical, trachea midline and thyroid normal to inspection and palpation Lungs: clear to auscultation bilaterally Cardiovascular: regular rate and rhythm Breasts: normal appearance, no masses or tenderness Abdomen: soft, non-tender; non distended,  no masses,  no organomegaly Extremities: extremities normal, atraumatic, no cyanosis or edema Skin: Skin color, texture, turgor normal. No rashes or lesions Lymph nodes: Cervical, supraclavicular, and axillary nodes normal. No abnormal inguinal nodes palpated Neurologic: Grossly normal   Pelvic: External  genitalia:  no lesions              Urethra:  normal appearing urethra with no masses, tenderness or lesions              Bartholins and Skenes: normal                 Vagina: normal appearing vagina with normal color and discharge, no lesions              Cervix: no lesions  Bimanual Exam:  Uterus:  firm mass ? RV uterus or adnexal mass posterior to the uterus              Adnexa: no mass, fullness, tenderness               Rectovaginal: Confirms               Anus:  normal sphincter tone, no lesions  Shanon Petty chaperoned for the exam.  A:  Well Woman with normal exam  BMI 38, she has a bariatric team  PMP  Under stress, mild depression, anxiety. She has started talking to a counselor, started exercising. Discussed medication   Pelvic mass, ? Fibroid or adnexa, feels like a RV uterus but on review of prior ultrasound uterus was AV    P:   Pap next year  Mammogram due  Colonoscopy UTD  Discussed breast self exam  Discussed calcium and vit D intake  Start Celexa, f/u in one month  Labs with primary  F/U for pelvic ultrasound  In addition to the annual exam over 10 minutes was spent in counseling and management of depression and anxiety

## 2020-03-18 ENCOUNTER — Ambulatory Visit: Payer: 59 | Admitting: Obstetrics and Gynecology

## 2020-03-18 ENCOUNTER — Other Ambulatory Visit: Payer: Self-pay

## 2020-03-18 ENCOUNTER — Telehealth: Payer: Self-pay | Admitting: Obstetrics and Gynecology

## 2020-03-18 ENCOUNTER — Encounter: Payer: Self-pay | Admitting: Obstetrics and Gynecology

## 2020-03-18 VITALS — BP 118/64 | HR 85 | Ht 62.0 in | Wt 212.6 lb

## 2020-03-18 DIAGNOSIS — R1900 Intra-abdominal and pelvic swelling, mass and lump, unspecified site: Secondary | ICD-10-CM | POA: Diagnosis not present

## 2020-03-18 DIAGNOSIS — F418 Other specified anxiety disorders: Secondary | ICD-10-CM | POA: Diagnosis not present

## 2020-03-18 DIAGNOSIS — Z01419 Encounter for gynecological examination (general) (routine) without abnormal findings: Secondary | ICD-10-CM | POA: Diagnosis not present

## 2020-03-18 DIAGNOSIS — Z6838 Body mass index (BMI) 38.0-38.9, adult: Secondary | ICD-10-CM | POA: Diagnosis not present

## 2020-03-18 MED ORDER — CITALOPRAM HYDROBROMIDE 20 MG PO TABS
ORAL_TABLET | ORAL | 1 refills | Status: DC
Start: 2020-03-18 — End: 2020-04-15

## 2020-03-18 NOTE — Telephone Encounter (Signed)
Patient needs to return for a 4 week follow . To triage to assist with scheduling.

## 2020-03-18 NOTE — Patient Instructions (Addendum)
EXERCISE AND DIET:  We recommended that you start or continue a regular exercise program for good health. Regular exercise means any activity that makes your heart beat faster and makes you sweat.  We recommend exercising at least 30 minutes per day at least 3 days a week, preferably 4 or 5.  We also recommend a diet low in fat and sugar.  Inactivity, poor dietary choices and obesity can cause diabetes, heart attack, stroke, and kidney damage, among others.    ALCOHOL AND SMOKING:  Women should limit their alcohol intake to no more than 7 drinks/beers/glasses of wine (combined, not each!) per week. Moderation of alcohol intake to this level decreases your risk of breast cancer and liver damage. And of course, no recreational drugs are part of a healthy lifestyle.  And absolutely no smoking or even second hand smoke. Most people know smoking can cause heart and lung diseases, but did you know it also contributes to weakening of your bones? Aging of your skin?  Yellowing of your teeth and nails?  CALCIUM AND VITAMIN D:  Adequate intake of calcium and Vitamin D are recommended.  The recommendations for exact amounts of these supplements seem to change often, but generally speaking 1,200 mg of calcium (between diet and supplement) and 800 units of Vitamin D per day seems prudent. Certain women may benefit from higher intake of Vitamin D.  If you are among these women, your doctor will have told you during your visit.    PAP SMEARS:  Pap smears, to check for cervical cancer or precancers,  have traditionally been done yearly, although recent scientific advances have shown that most women can have pap smears less often.  However, every woman still should have a physical exam from her gynecologist every year. It will include a breast check, inspection of the vulva and vagina to check for abnormal growths or skin changes, a visual exam of the cervix, and then an exam to evaluate the size and shape of the uterus and  ovaries.  And after 54 years of age, a rectal exam is indicated to check for rectal cancers. We will also provide age appropriate advice regarding health maintenance, like when you should have certain vaccines, screening for sexually transmitted diseases, bone density testing, colonoscopy, mammograms, etc.   MAMMOGRAMS:  All women over 40 years old should have a yearly mammogram. Many facilities now offer a "3D" mammogram, which may cost around $50 extra out of pocket. If possible,  we recommend you accept the option to have the 3D mammogram performed.  It both reduces the number of women who will be called back for extra views which then turn out to be normal, and it is better than the routine mammogram at detecting truly abnormal areas.    COLON CANCER SCREENING: Now recommend starting at age 45. At this time colonoscopy is not covered for routine screening until 50. There are take home tests that can be done between 45-49.   COLONOSCOPY:  Colonoscopy to screen for colon cancer is recommended for all women at age 50.  We know, you hate the idea of the prep.  We agree, BUT, having colon cancer and not knowing it is worse!!  Colon cancer so often starts as a polyp that can be seen and removed at colonscopy, which can quite literally save your life!  And if your first colonoscopy is normal and you have no family history of colon cancer, most women don't have to have it again for   10 years.  Once every ten years, you can do something that may end up saving your life, right?  We will be happy to help you get it scheduled when you are ready.  Be sure to check your insurance coverage so you understand how much it will cost.  It may be covered as a preventative service at no cost, but you should check your particular policy.      Breast Self-Awareness Breast self-awareness means being familiar with how your breasts look and feel. It involves checking your breasts regularly and reporting any changes to your  health care provider. Practicing breast self-awareness is important. A change in your breasts can be a sign of a serious medical problem. Being familiar with how your breasts look and feel allows you to find any problems early, when treatment is more likely to be successful. All women should practice breast self-awareness, including women who have had breast implants. How to do a breast self-exam One way to learn what is normal for your breasts and whether your breasts are changing is to do a breast self-exam. To do a breast self-exam: Look for Changes  1. Remove all the clothing above your waist. 2. Stand in front of a mirror in a room with good lighting. 3. Put your hands on your hips. 4. Push your hands firmly downward. 5. Compare your breasts in the mirror. Look for differences between them (asymmetry), such as: ? Differences in shape. ? Differences in size. ? Puckers, dips, and bumps in one breast and not the other. 6. Look at each breast for changes in your skin, such as: ? Redness. ? Scaly areas. 7. Look for changes in your nipples, such as: ? Discharge. ? Bleeding. ? Dimpling. ? Redness. ? A change in position. Feel for Changes Carefully feel your breasts for lumps and changes. It is best to do this while lying on your back on the floor and again while sitting or standing in the shower or tub with soapy water on your skin. Feel each breast in the following way:  Place the arm on the side of the breast you are examining above your head.  Feel your breast with the other hand.  Start in the nipple area and make  inch (2 cm) overlapping circles to feel your breast. Use the pads of your three middle fingers to do this. Apply light pressure, then medium pressure, then firm pressure. The light pressure will allow you to feel the tissue closest to the skin. The medium pressure will allow you to feel the tissue that is a little deeper. The firm pressure will allow you to feel the tissue  close to the ribs.  Continue the overlapping circles, moving downward over the breast until you feel your ribs below your breast.  Move one finger-width toward the center of the body. Continue to use the  inch (2 cm) overlapping circles to feel your breast as you move slowly up toward your collarbone.  Continue the up and down exam using all three pressures until you reach your armpit.  Write Down What You Find  Write down what is normal for each breast and any changes that you find. Keep a written record with breast changes or normal findings for each breast. By writing this information down, you do not need to depend only on memory for size, tenderness, or location. Write down where you are in your menstrual cycle, if you are still menstruating. If you are having trouble noticing differences   in your breasts, do not get discouraged. With time you will become more familiar with the variations in your breasts and more comfortable with the exam. How often should I examine my breasts? Examine your breasts every month. If you are breastfeeding, the best time to examine your breasts is after a feeding or after using a breast pump. If you menstruate, the best time to examine your breasts is 5-7 days after your period is over. During your period, your breasts are lumpier, and it may be more difficult to notice changes. When should I see my health care provider? See your health care provider if you notice:  A change in shape or size of your breasts or nipples.  A change in the skin of your breast or nipples, such as a reddened or scaly area.  Unusual discharge from your nipples.  A lump or thick area that was not there before.  Pain in your breasts.  Anything that concerns you.   Living With Depression Everyone experiences occasional disappointment, sadness, and loss in their lives. When you are feeling down, blue, or sad for at least 2 weeks in a row, it may mean that you have depression.  Depression can affect your thoughts and feelings, relationships, daily activities, and physical health. It is caused by changes in the way your brain functions. If you receive a diagnosis of depression, your health care provider will tell you which type of depression you have and what treatment options are available to you. If you are living with depression, there are ways to help you recover from it and also ways to prevent it from coming back. How to cope with lifestyle changes Coping with stress     Stress is your body's reaction to life changes and events, both good and bad. Stressful situations may include:  Getting married.  The death of a spouse.  Losing a job.  Retiring.  Having a baby. Stress can last just a few hours or it can be ongoing. Stress can play a major role in depression, so it is important to learn both how to cope with stress and how to think about it differently. Talk with your health care provider or a counselor if you would like to learn more about stress reduction. He or she may suggest some stress reduction techniques, such as:  Music therapy. This can include creating music or listening to music. Choose music that you enjoy and that inspires you.  Mindfulness-based meditation. This kind of meditation can be done while sitting or walking. It involves being aware of your normal breaths, rather than trying to control your breathing.  Centering prayer. This is a kind of meditation that involves focusing on a spiritual word or phrase. Choose a word, phrase, or sacred image that is meaningful to you and that brings you peace.  Deep breathing. To do this, expand your stomach and inhale slowly through your nose. Hold your breath for 3-5 seconds, then exhale slowly, allowing your stomach muscles to relax.  Muscle relaxation. This involves intentionally tensing muscles then relaxing them. Choose a stress reduction technique that fits your lifestyle and personality.  Stress reduction techniques take time and practice to develop. Set aside 5-15 minutes a day to do them. Therapists can offer training in these techniques. The training may be covered by some insurance plans. Other things you can do to manage stress include:  Keeping a stress diary. This can help you learn what triggers your stress and ways to control your  response.  Understanding what your limits are and saying no to requests or events that lead to a schedule that is too full.  Thinking about how you respond to certain situations. You may not be able to control everything, but you can control how you react.  Adding humor to your life by watching funny films or TV shows.  Making time for activities that help you relax and not feeling guilty about spending your time this way.  Medicines Your health care provider may suggest certain medicines if he or she feels that they will help improve your condition. Avoid using alcohol and other substances that may prevent your medicines from working properly (may interact). It is also important to:  Talk with your pharmacist or health care provider about all the medicines that you take, their possible side effects, and what medicines are safe to take together.  Make it your goal to take part in all treatment decisions (shared decision-making). This includes giving input on the side effects of medicines. It is best if shared decision-making with your health care provider is part of your total treatment plan. If your health care provider prescribes a medicine, you may not notice the full benefits of it for 4-8 weeks. Most people who are treated for depression need to be on medicine for at least 6-12 months after they feel better. If you are taking medicines as part of your treatment, do not stop taking medicines without first talking to your health care provider. You may need to have the medicine slowly decreased (tapered) over time to decrease the risk of harmful  side effects. Relationships Your health care provider may suggest family therapy along with individual therapy and drug therapy. While there may not be family problems that are causing you to feel depressed, it is still important to make sure your family learns as much as they can about your mental health. Having your family's support can help make your treatment successful. How to recognize changes in your condition Everyone has a different response to treatment for depression. Recovery from major depression happens when you have not had signs of major depression for two months. This may mean that you will start to:  Have more interest in doing activities.  Feel less hopeless than you did 2 months ago.  Have more energy.  Overeat less often, or have better or improving appetite.  Have better concentration. Your health care provider will work with you to decide the next steps in your recovery. It is also important to recognize when your condition is getting worse. Watch for these signs:  Having fatigue or low energy.  Eating too much or too little.  Sleeping too much or too little.  Feeling restless, agitated, or hopeless.  Having trouble concentrating or making decisions.  Having unexplained physical complaints.  Feeling irritable, angry, or aggressive. Get help as soon as you or your family members notice these symptoms coming back. How to get support and help from others How to talk with friends and family members about your condition  Talking to friends and family members about your condition can provide you with one way to get support and guidance. Reach out to trusted friends or family members, explain your symptoms to them, and let them know that you are working with a health care provider to treat your depression. Financial resources Not all insurance plans cover mental health care, so it is important to check with your insurance carrier. If paying for co-pays or  counseling  services is a problem, search for a local or county mental health care center. They may be able to offer public mental health care services at low or no cost when you are not able to see a private health care provider. If you are taking medicine for depression, you may be able to get the generic form, which may be less expensive. Some makers of prescription medicines also offer help to patients who cannot afford the medicines they need. Follow these instructions at home:   Get the right amount and quality of sleep.  Cut down on using caffeine, tobacco, alcohol, and other potentially harmful substances.  Try to exercise, such as walking or lifting small weights.  Take over-the-counter and prescription medicines only as told by your health care provider.  Eat a healthy diet that includes plenty of vegetables, fruits, whole grains, low-fat dairy products, and lean protein. Do not eat a lot of foods that are high in solid fats, added sugars, or salt.  Keep all follow-up visits as told by your health care provider. This is important. Contact a health care provider if:  You stop taking your antidepressant medicines, and you have any of these symptoms: ? Nausea. ? Headache. ? Feeling lightheaded. ? Chills and body aches. ? Not being able to sleep (insomnia).  You or your friends and family think your depression is getting worse. Get help right away if:  You have thoughts of hurting yourself or others. If you ever feel like you may hurt yourself or others, or have thoughts about taking your own life, get help right away. You can go to your nearest emergency department or call:  Your local emergency services (911 in the [place]  A suicide crisis helpline, such as the [place] at (905)010-8433. This is open 24-hours a day. Summary  If you are living with depression, there are ways to help you recover from it and also ways to prevent it from coming  back.  Work with your health care team to create a management plan that includes counseling, stress management techniques, and healthy lifestyle habits. This information is not intended to replace advice given to you by your health care provider. Make sure you discuss any questions you have with your health care provider. Document Revised: 11/15/2018 Document Reviewed: 06/26/2016 Elsevier Patient Education  [place], Adult After being diagnosed with an anxiety disorder, you may be relieved to know why you have felt or behaved a certain way. You may also feel overwhelmed about the treatment ahead and what it will mean for your life. With care and support, you can manage this condition and recover from it. How to manage lifestyle changes Managing stress and anxiety  Stress is your body's reaction to life changes and events, both good and bad. Most stress will last just a few hours, but stress can be ongoing and can lead to more than just stress. Although stress can play a major role in anxiety, it is not the same as anxiety. Stress is usually caused by something external, such as a deadline, test, or competition. Stress normally passes after the triggering event has ended.  Anxiety is caused by something internal, such as imagining a terrible outcome or worrying that something will go wrong that will devastate you. Anxiety often does not go away even after the triggering event is over, and it can become long-term (chronic) worry. It is important to understand the differences between stress and anxiety and  to manage your stress effectively so that it does not lead to an anxious response. Talk with your health care provider or a counselor to learn more about reducing anxiety and stress. He or she may suggest tension reduction techniques, such as:  Music therapy. This can include creating or listening to music that you enjoy and that inspires you.  Mindfulness-based  meditation. This involves being aware of your normal breaths while not trying to control your breathing. It can be done while sitting or walking.  Centering prayer. This involves focusing on a word, phrase, or sacred image that means something to you and brings you peace.  Deep breathing. To do this, expand your stomach and inhale slowly through your nose. Hold your breath for 3-5 seconds. Then exhale slowly, letting your stomach muscles relax.  Self-talk. This involves identifying thought patterns that lead to anxiety reactions and changing those patterns.  Muscle relaxation. This involves tensing muscles and then relaxing them. Choose a tension reduction technique that suits your lifestyle and personality. These techniques take time and practice. Set aside 5-15 minutes a day to do them. Therapists can offer counseling and training in these techniques. The training to help with anxiety may be covered by some insurance plans. Other things you can do to manage stress and anxiety include:  Keeping a stress/anxiety diary. This can help you learn what triggers your reaction and then learn ways to manage your response.  Thinking about how you react to certain situations. You may not be able to control everything, but you can control your response.  Making time for activities that help you relax and not feeling guilty about spending your time in this way.  Visual imagery and yoga can help you stay calm and relax.  Medicines Medicines can help ease symptoms. Medicines for anxiety include:  Anti-anxiety drugs.  Antidepressants. Medicines are often used as a primary treatment for anxiety disorder. Medicines will be prescribed by a health care provider. When used together, medicines, psychotherapy, and tension reduction techniques may be the most effective treatment. Relationships Relationships can play a big part in helping you recover. Try to spend more time connecting with trusted friends and  family members. Consider going to couples counseling, taking family education classes, or going to family therapy. Therapy can help you and others better understand your condition. How to recognize changes in your anxiety Everyone responds differently to treatment for anxiety. Recovery from anxiety happens when symptoms decrease and stop interfering with your daily activities at home or work. This may mean that you will start to:  Have better concentration and focus. Worry will interfere less in your daily thinking.  Sleep better.  Be less irritable.  Have more energy.  Have improved memory. It is important to recognize when your condition is getting worse. Contact your health care provider if your symptoms interfere with home or work and you feel like your condition is not improving. Follow these instructions at home: Activity  Exercise. Most adults should do the following: ? Exercise for at least 150 minutes each week. The exercise should increase your heart rate and make you sweat (moderate-intensity exercise). ? Strengthening exercises at least twice a week.  Get the right amount and quality of sleep. Most adults need 7-9 hours of sleep each night. Lifestyle   Eat a healthy diet that includes plenty of vegetables, fruits, whole grains, low-fat dairy products, and lean protein. Do not eat a lot of foods that are high in solid fats, added sugars,  or salt.  Make choices that simplify your life.  Do not use any products that contain nicotine or tobacco, such as cigarettes, e-cigarettes, and chewing tobacco. If you need help quitting, ask your health care provider.  Avoid caffeine, alcohol, and certain over-the-counter cold medicines. These may make you feel worse. Ask your pharmacist which medicines to avoid. General instructions  Take over-the-counter and prescription medicines only as told by your health care provider.  Keep all follow-up visits as told by your health care  provider. This is important. Where to find support You can get help and support from these sources:  Self-help groups.  Online and [employer].  A trusted spiritual leader.  Couples counseling.  Family education classes.  Family therapy. Where to find more information You may find that joining a support group helps you deal with your anxiety. The following sources can help you locate counselors or support groups near you:  [place]: www.mentalhealthamerica.net  Anxiety and Depression Association of [place] (ADAA): https://www.clark.net/  National Alliance on Mental Illness (NAMI): www.nami.org Contact a health care provider if you:  Have a hard time staying focused or finishing daily tasks.  Spend many hours a day feeling worried about everyday life.  Become exhausted by worry.  Start to have headaches, feel tense, or have nausea.  Urinate more than normal.  Have diarrhea. Get help right away if you have:  A racing heart and shortness of breath.  Thoughts of hurting yourself or others. If you ever feel like you may hurt yourself or others, or have thoughts about taking your own life, get help right away. You can go to your nearest emergency department or call:  Your local emergency services (911 in the [place]  A suicide crisis helpline, such as the [place] at 2393451617. This is open 24 hours a day. Summary  Taking steps to learn and use tension reduction techniques can help calm you and help prevent triggering an anxiety reaction.  When used together, medicines, psychotherapy, and tension reduction techniques may be the most effective treatment.  Family, friends, and partners can play a big part in helping you recover from an anxiety disorder. This information is not intended to replace advice given to you by your health care provider. Make sure you discuss any questions you have with your health care  provider. Document Revised: 12/24/2018 Document Reviewed: 12/24/2018 Elsevier Patient Education  [place].

## 2020-03-18 NOTE — Telephone Encounter (Signed)
Spoke with patient, OV scheduled for 04/15/20 at 4:30pm with Dr. Talbert Nan.  Patient agreeable to date and time. Encounter closed.

## 2020-03-18 NOTE — Telephone Encounter (Signed)
Spoke with patient regarding benefits for recommended ultrasound. Patient is aware that ultrasound is transvaginal. Patient acknowledges understanding of information presented. Patient is aware of cancellation policy. Patient scheduled appointment for 03/30/2020 at 0100PM with Sumner Boast, MD. Encounter closed.

## 2020-03-23 ENCOUNTER — Ambulatory Visit: Payer: 59 | Admitting: Podiatry

## 2020-03-26 ENCOUNTER — Other Ambulatory Visit: Payer: Self-pay | Admitting: Internal Medicine

## 2020-03-26 DIAGNOSIS — Z1231 Encounter for screening mammogram for malignant neoplasm of breast: Secondary | ICD-10-CM

## 2020-03-30 ENCOUNTER — Other Ambulatory Visit: Payer: Self-pay

## 2020-03-30 ENCOUNTER — Ambulatory Visit: Payer: 59 | Admitting: Obstetrics and Gynecology

## 2020-03-30 ENCOUNTER — Ambulatory Visit (INDEPENDENT_AMBULATORY_CARE_PROVIDER_SITE_OTHER): Payer: 59

## 2020-03-30 ENCOUNTER — Encounter: Payer: Self-pay | Admitting: Podiatry

## 2020-03-30 ENCOUNTER — Encounter: Payer: Self-pay | Admitting: Obstetrics and Gynecology

## 2020-03-30 VITALS — BP 136/82 | HR 83 | Ht 62.0 in | Wt 209.0 lb

## 2020-03-30 DIAGNOSIS — R1900 Intra-abdominal and pelvic swelling, mass and lump, unspecified site: Secondary | ICD-10-CM

## 2020-03-30 DIAGNOSIS — D259 Leiomyoma of uterus, unspecified: Secondary | ICD-10-CM

## 2020-03-30 NOTE — Progress Notes (Signed)
GYNECOLOGY  VISIT   HPI: 54 y.o.   Divorced Black or [place] American Not Hispanic or Latino  female   [phone] with No LMP recorded. (Menstrual status: Perimenopausal).   here for ultrasound consult. At her annual exam there was a question of an adnexal mass.     GYNECOLOGIC HISTORY: No LMP recorded. (Menstrual status: Perimenopausal). Contraception:none  Menopausal hormone therapy: none         OB History    Gravida  2   Para  2   Term  1   Preterm  1   AB      Living  2     SAB      TAB      Ectopic      Multiple  2   Live Births  4              Patient Active Problem List   Diagnosis Date Noted  . Anxiety 02/11/2019  . Educated about COVID-19 virus infection 02/11/2019  . Rash 11/07/2017  . Elevated LFTs 12/04/2016  . Routine general medical examination at a health care facility 07/11/2016  . S/P laparoscopic sleeve gastrectomy 10/14/2015  . Multiple gastric polyps 08/18/2015  . Trigger thumb of left hand 04/05/2015  . OSA (obstructive sleep apnea) 02/05/2013  . Obesity 09/26/2010  . Sinusitis 08/15/2010  . MIGRAINE HEADACHE 02/09/2010  . ALLERGIC RHINITIS 02/09/2010  . Asthma 02/09/2010  . GERD 02/09/2010  . Arthritis 02/09/2010    Past Medical History:  Diagnosis Date  . ALLERGIC RHINITIS   . ANEMIA-NOS   . ARTHRITIS   . ASTHMA   . DEPRESSION   . Fibroid   . GERD   . MIGRAINE HEADACHE   . OBESITY, MORBID   . Vitamin D deficiency     Past Surgical History:  Procedure Laterality Date  . CHOLECYSTECTOMY  2009  . ESOPHAGOGASTRODUODENOSCOPY N/A 12/23/2012   Procedure: ESOPHAGOGASTRODUODENOSCOPY (EGD);  Surgeon: Juanita Craver, MD;  Location: [facility];  Service: Endoscopy;  Laterality: N/A;  . INTRAUTERINE DEVICE (IUD) INSERTION  12-15-13   Mirena  . [place] RESECTION  2/17  . ROOT CANAL    . Skin graft surgery (R) leg     childhood    Current Outpatient Medications  Medication Sig Dispense Refill  . albuterol  (VENTOLIN HFA) 108 (90 Base) MCG/ACT inhaler Inhale 1-2 puffs into the lungs every 4 (four) hours as needed for wheezing or shortness of breath. 18 g 1  . ALPRAZolam (XANAX) 0.5 MG tablet TAKE 1 TABLET(0.5 MG) BY MOUTH DAILY AS NEEDED FOR ANXIETY 30 tablet 0  . budesonide-formoterol (SYMBICORT) 160-4.5 MCG/ACT inhaler Inhale 2 puffs into the lungs 2 (two) times daily. 1 Inhaler 0  . Cholecalciferol (VITAMIN D) 2000 units tablet Take 2,000 Units by mouth daily.    . ciclopirox (LOPROX) 0.77 % cream APPLY TOPICALLY TO THE AFFECTED AREA TWICE DAILY    . citalopram (CELEXA) 20 MG tablet Take 1/2 a tablet a day for one week, if tolerating increase to 1 tablet a day. 30 tablet 1  . famotidine (PEPCID) 20 MG tablet Take 1 tablet (20 mg total) by mouth 2 (two) times daily as needed for heartburn or indigestion. 180 tablet 3  . linaclotide (LINZESS) 145 MCG CAPS capsule Take 1 capsule by mouth 2 (two) times daily.    Marland Kitchen loratadine (CLARITIN) 10 MG tablet Take 1 tablet (10 mg total) by mouth daily. 90 tablet 3  . meclizine (ANTIVERT) 25 MG  tablet Take 1 tablet (25 mg total) by mouth 3 (three) times daily as needed for dizziness. 30 tablet 0  . Multiple Vitamin (MULTIVITAMIN) tablet Take 1 tablet by mouth daily.    Marland Kitchen nystatin ointment (MYCOSTATIN) APPLY TOPICALLY TWICE DAILY 90 g 3  . omeprazole (PRILOSEC) 20 MG capsule Take 1 capsule (20 mg total) by mouth daily. 90 capsule 3  . phentermine 15 MG capsule Take 1 capsule by mouth daily.    . SUMAtriptan (IMITREX) 50 MG tablet Take 1 tablet (50 mg total) by mouth every 2 (two) hours as needed for migraine. May repeat in 2 hours if headache persists or recurs. 10 tablet 5  . terbinafine (LAMISIL) 250 MG tablet Take 1 tablet (250 mg total) by mouth daily. 30 tablet 0   No current facility-administered medications for this visit.     ALLERGIES: Penicillins  Family History  Problem Relation Age of Onset  . Arthritis Mother   . Arthritis Father   . Alcohol  abuse Other        Parents  . Arthritis Other        grandparents  . Diabetes Other        Parent & grandparent  . Hyperlipidemia Other        Parent, grandparent, other relative  . Hypertension Other        parent, grandparent, other relative  . Prostate cancer Other        grandfather  . Breast cancer Sister   . Breast cancer Sister   . Breast cancer Paternal Aunt     Social History   Socioeconomic History  . Marital status: Divorced    Spouse name: Not on file  . Number of children: Not on file  . Years of education: Not on file  . Highest education level: Not on file  Occupational History  . Occupation: [place]    Employer: [employer]  Tobacco Use  . Smoking status: Never Smoker  . Smokeless tobacco: Never Used  Vaping Use  . Vaping Use: Never used  Substance and Sexual Activity  . Alcohol use: Yes    Comment: Social  . Drug use: No  . Sexual activity: Yes    Partners: Male    Birth control/protection: Condom  Other Topics Concern  . 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 Difficulty of Paying Living Expenses: Not on file  Food Insecurity:   . Worried About Charity fundraiser in the Last Year: Not on file  . Ran Out of Food in the Last Year: Not on file  Transportation Needs:   . Lack of Transportation (Medical): Not on file  . Lack of Transportation (Non-Medical): Not on file  Physical Activity:   . Days of Exercise per Week: Not on file  . Minutes of Exercise per Session: Not on file  Stress:   . Feeling of Stress : Not on file  Social Connections:   . Frequency of Communication with Friends and Family: Not on file  . Frequency of Social Gatherings with Friends and Family: Not on file  . Attends Religious Services: Not on file  . Active Member of Clubs or  Organizations: Not on file  . Attends Archivist Meetings: Not on file  . Marital Status: Not on file  Intimate Partner Violence:   .  Fear of Current or Ex-Partner: Not on file  . Emotionally Abused: Not on file  . Physically Abused: Not on file  . Sexually Abused: Not on file    Review of Systems  All other systems reviewed and are negative.   PHYSICAL EXAMINATION:    BP 136/82   Pulse 83   Ht 5\' 2"  (1.575 m)   Wt 209 lb (94.8 kg)   SpO2 100%   BMI 38.23 kg/m     General appearance: alert, cooperative and appears stated age  Ultrasound images reviewed with the patient, she has a retroverted uterus with a fibroid at the fundus. This explains her exam findings. She has several small myomas, normal adnexa bilaterally and a thin endometrium  ASSESSMENT Firm mass felt on pelvic exam during annual exam. Ultrasound c/w fibroid uterus    PLAN Patient reassured Routine f/u

## 2020-03-31 ENCOUNTER — Encounter: Payer: Self-pay | Admitting: Obstetrics and Gynecology

## 2020-04-06 ENCOUNTER — Ambulatory Visit
Admission: RE | Admit: 2020-04-06 | Discharge: 2020-04-06 | Disposition: A | Discharge status: Home / Self Care | Payer: 59 | Source: Ambulatory Visit | Attending: Internal Medicine | Admitting: Internal Medicine

## 2020-04-06 ENCOUNTER — Other Ambulatory Visit: Payer: Self-pay

## 2020-04-06 DIAGNOSIS — Z1231 Encounter for screening mammogram for malignant neoplasm of breast: Secondary | ICD-10-CM

## 2020-04-15 ENCOUNTER — Encounter: Payer: Self-pay | Admitting: Obstetrics and Gynecology

## 2020-04-15 ENCOUNTER — Other Ambulatory Visit: Payer: Self-pay

## 2020-04-15 ENCOUNTER — Ambulatory Visit: Payer: 59 | Admitting: Obstetrics and Gynecology

## 2020-04-15 VITALS — Ht 62.0 in | Wt 210.0 lb

## 2020-04-15 DIAGNOSIS — F418 Other specified anxiety disorders: Secondary | ICD-10-CM

## 2020-04-15 DIAGNOSIS — Z79899 Other long term (current) drug therapy: Secondary | ICD-10-CM

## 2020-04-15 MED ORDER — CITALOPRAM HYDROBROMIDE 20 MG PO TABS: ORAL_TABLET | ORAL | 3 refills | Status: DC

## 2020-04-15 NOTE — Progress Notes (Signed)
GYNECOLOGY  VISIT   HPI: 54 y.o.   Divorced Black or [place] American Not Hispanic or Latino  female   [phone] with No LMP recorded. (Menstrual status: Perimenopausal).   here for f/u of anxiety and mild depression. She was started on Celexa last month.   She says that she has been taking a half of a pill and she feels like it has been helping. A couple of times she has taken a whole pill. For the most part she is feeling a little better, not as overwhelmed. Anxiety is definitely better, 50% better. Not feeling depressed any more.  She feels a little groggy after taking the pill, has taken at night and it worked okay.   Mom has signs and symptoms of dementia. She is getting her in to see a Neurologist.   GYNECOLOGIC HISTORY: No LMP recorded. (Menstrual status: Perimenopausal). Contraception: none  Menopausal hormone therapy: none         OB History    Gravida  2   Para  2   Term  1   Preterm  1   AB      Living  2     SAB      TAB      Ectopic      Multiple  2   Live Births  4              Patient Active Problem List   Diagnosis Date Noted  . Anxiety 02/11/2019  . Educated about COVID-19 virus infection 02/11/2019  . Rash 11/07/2017  . Elevated LFTs 12/04/2016  . Routine general medical examination at a health care facility 07/11/2016  . S/P laparoscopic sleeve gastrectomy 10/14/2015  . Multiple gastric polyps 08/18/2015  . Trigger thumb of left hand 04/05/2015  . OSA (obstructive sleep apnea) 02/05/2013  . Obesity 09/26/2010  . Sinusitis 08/15/2010  . MIGRAINE HEADACHE 02/09/2010  . ALLERGIC RHINITIS 02/09/2010  . Asthma 02/09/2010  . GERD 02/09/2010  . Arthritis 02/09/2010    Past Medical History:  Diagnosis Date  . ALLERGIC RHINITIS   . ANEMIA-NOS   . ARTHRITIS   . ASTHMA   . DEPRESSION   . Fibroid   . GERD   . MIGRAINE HEADACHE   . OBESITY, MORBID   . Vitamin D deficiency     Past Surgical History:  Procedure Laterality Date  .  CHOLECYSTECTOMY  2009  . ESOPHAGOGASTRODUODENOSCOPY N/A 12/23/2012   Procedure: ESOPHAGOGASTRODUODENOSCOPY (EGD);  Surgeon: Juanita Craver, MD;  Location: [facility];  Service: Endoscopy;  Laterality: N/A;  . INTRAUTERINE DEVICE (IUD) INSERTION  12-15-13   Mirena  . [place] RESECTION  2/17  . ROOT CANAL    . Skin graft surgery (R) leg     childhood    Current Outpatient Medications  Medication Sig Dispense Refill  . albuterol (VENTOLIN HFA) 108 (90 Base) MCG/ACT inhaler Inhale 1-2 puffs into the lungs every 4 (four) hours as needed for wheezing or shortness of breath. 18 g 1  . ALPRAZolam (XANAX) 0.5 MG tablet TAKE 1 TABLET(0.5 MG) BY MOUTH DAILY AS NEEDED FOR ANXIETY 30 tablet 0  . budesonide-formoterol (SYMBICORT) 160-4.5 MCG/ACT inhaler Inhale 2 puffs into the lungs 2 (two) times daily. 1 Inhaler 0  . Cholecalciferol (VITAMIN D) 2000 units tablet Take 2,000 Units by mouth daily.    . ciclopirox (LOPROX) 0.77 % cream APPLY TOPICALLY TO THE AFFECTED AREA TWICE DAILY    . citalopram (CELEXA) 20 MG tablet Take 1  tablet a day. 90 tablet 3  . famotidine (PEPCID) 20 MG tablet Take 1 tablet (20 mg total) by mouth 2 (two) times daily as needed for heartburn or indigestion. 180 tablet 3  . linaclotide (LINZESS) 145 MCG CAPS capsule Take 1 capsule by mouth 2 (two) times daily.    Marland Kitchen loratadine (CLARITIN) 10 MG tablet Take 1 tablet (10 mg total) by mouth daily. 90 tablet 3  . meclizine (ANTIVERT) 25 MG tablet Take 1 tablet (25 mg total) by mouth 3 (three) times daily as needed for dizziness. 30 tablet 0  . Multiple Vitamin (MULTIVITAMIN) tablet Take 1 tablet by mouth daily.    Marland Kitchen nystatin ointment (MYCOSTATIN) APPLY TOPICALLY TWICE DAILY 90 g 3  . omeprazole (PRILOSEC) 20 MG capsule Take 1 capsule (20 mg total) by mouth daily. 90 capsule 3  . phentermine 15 MG capsule Take 1 capsule by mouth daily.    . SUMAtriptan (IMITREX) 50 MG tablet Take 1 tablet (50 mg total) by mouth every 2  (two) hours as needed for migraine. May repeat in 2 hours if headache persists or recurs. 10 tablet 5  . terbinafine (LAMISIL) 250 MG tablet Take 1 tablet (250 mg total) by mouth daily. 30 tablet 0   No current facility-administered medications for this visit.     ALLERGIES: Penicillins  Family History  Problem Relation Age of Onset  . Arthritis Mother   . Arthritis Father   . Alcohol abuse Other        Parents  . Arthritis Other        grandparents  . Diabetes Other        Parent & grandparent  . Hyperlipidemia Other        Parent, grandparent, other relative  . Hypertension Other        parent, grandparent, other relative  . Prostate cancer Other        grandfather  . Breast cancer Sister   . Breast cancer Sister   . Breast cancer Paternal Aunt     Social History   Socioeconomic History  . Marital status: Divorced    Spouse name: Not on file  . Number of children: Not on file  . Years of education: Not on file  . Highest education level: Not on file  Occupational History  . Occupation: [place]    Employer: [employer]  Tobacco Use  . Smoking status: Never Smoker  . Smokeless tobacco: Never Used  Vaping Use  . Vaping Use: Never used  Substance and Sexual Activity  . Alcohol use: Yes    Comment: Social  . Drug use: No  . Sexual activity: Yes    Partners: Male    Birth control/protection: Condom  Other Topics Concern  . 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 Difficulty of Paying Living Expenses: Not on file  Food Insecurity:   . Worried About Charity fundraiser in the Last Year: Not on file  . Ran Out of Food in the Last Year: Not on file  Transportation Needs:   . Lack of Transportation (Medical): Not on file  . Lack of Transportation (Non-Medical): Not on file   Physical Activity:   . Days of Exercise per Week: Not on file  . Minutes of Exercise per Session: Not on file  Stress:   .  Feeling of Stress : Not on file  Social Connections:   . Frequency of Communication with Friends and Family: Not on file  . Frequency of Social Gatherings with Friends and Family: Not on file  . Attends Religious Services: Not on file  . Active Member of Clubs or Organizations: Not on file  . Attends Archivist Meetings: Not on file  . Marital Status: Not on file  Intimate Partner Violence:   . Fear of Current or Ex-Partner: Not on file  . Emotionally Abused: Not on file  . Physically Abused: Not on file  . Sexually Abused: Not on file    Review of Systems  All other systems reviewed and are negative.   PHYSICAL EXAMINATION:    Ht 5\' 2"  (1.575 m)   Wt 210 lb (95.3 kg)   BMI 38.41 kg/m     General appearance: alert, cooperative and appears stated age  ASSESSMENT H/o depression and anxiety    PLAN Will increase her Celexa to 20 mg a day She will call if she feels she needs a higher dose.

## 2020-04-16 ENCOUNTER — Encounter: Payer: Self-pay | Admitting: Obstetrics and Gynecology

## 2020-04-19 ENCOUNTER — Encounter: Payer: Self-pay | Admitting: Internal Medicine

## 2020-04-20 ENCOUNTER — Other Ambulatory Visit: Payer: Self-pay

## 2020-04-20 ENCOUNTER — Ambulatory Visit: Payer: 59 | Admitting: Podiatry

## 2020-04-20 DIAGNOSIS — Z79899 Other long term (current) drug therapy: Secondary | ICD-10-CM | POA: Diagnosis not present

## 2020-04-20 DIAGNOSIS — B351 Tinea unguium: Secondary | ICD-10-CM | POA: Diagnosis not present

## 2020-04-20 DIAGNOSIS — L603 Nail dystrophy: Secondary | ICD-10-CM | POA: Diagnosis not present

## 2020-04-20 LAB — HEPATIC FUNCTION PANEL
AG Ratio: 1.5 (calc) (ref 1.0–2.5)
ALT: 9 U/L (ref 6–29)
AST: 15 U/L (ref 10–35)
Albumin: 4 g/dL (ref 3.6–5.1)
Alkaline phosphatase (APISO): 120 U/L (ref 37–153)
Bilirubin, Direct: 0.1 mg/dL (ref 0.0–0.2)
Globulin: 2.6 g/dL (ref 1.9–3.7)
Indirect Bilirubin: 0.4 mg/dL (ref 0.2–1.2)
Total Bilirubin: 0.5 mg/dL (ref 0.2–1.2)
Total Protein: 6.6 g/dL (ref 6.1–8.1)

## 2020-04-20 MED ORDER — TERBINAFINE HCL 250 MG PO TABS: 250.0000 mg | ORAL_TABLET | Freq: Every day | ORAL | 1 refills | Status: DC

## 2020-04-20 NOTE — Progress Notes (Signed)
°  Subjective:  Patient ID: Alicia Chen, female    DOB: August 09, 1965,  MRN: [id number]  No chief complaint on file.   54 y.o. female presents with the above complaint. History confirmed with patient.   Objective:  Physical Exam: warm, good capillary refill, no trophic changes or ulcerative lesions, normal DP and PT pulses and normal sensory exam. Left hallux nail halfway clear, right hallux nail 80% clear  Assessment:   1. Onychomycosis   2. Nail dystrophy   3. Encounter for long-term (current) use of medications    Plan:  Patient was evaluated and treated and all questions answered.  Onychomycosis -Nails improving on lamisil. Continue medication -Recheck LFTs. Normal on 7/22.  -Refill Lamisil x2 months -F/u in 2 months   No follow-ups on file.

## 2020-06-18 ENCOUNTER — Other Ambulatory Visit: Payer: Self-pay

## 2020-06-18 ENCOUNTER — Encounter: Payer: Self-pay | Admitting: Internal Medicine

## 2020-06-18 ENCOUNTER — Ambulatory Visit: Payer: 59 | Admitting: Internal Medicine

## 2020-06-18 VITALS — BP 140/90 | HR 69 | Temp 98.0°F | Ht 62.0 in | Wt 217.6 lb

## 2020-06-18 DIAGNOSIS — Z23 Encounter for immunization: Secondary | ICD-10-CM

## 2020-06-18 DIAGNOSIS — J4520 Mild intermittent asthma, uncomplicated: Secondary | ICD-10-CM | POA: Diagnosis not present

## 2020-06-18 DIAGNOSIS — J0110 Acute frontal sinusitis, unspecified: Secondary | ICD-10-CM

## 2020-06-18 DIAGNOSIS — Z0000 Encounter for general adult medical examination without abnormal findings: Secondary | ICD-10-CM

## 2020-06-18 DIAGNOSIS — F419 Anxiety disorder, unspecified: Secondary | ICD-10-CM | POA: Diagnosis not present

## 2020-06-18 DIAGNOSIS — K219 Gastro-esophageal reflux disease without esophagitis: Secondary | ICD-10-CM

## 2020-06-18 LAB — COMPREHENSIVE METABOLIC PANEL
ALT: 10 U/L (ref 0–35)
AST: 16 U/L (ref 0–37)
Albumin: 4 g/dL (ref 3.5–5.2)
Alkaline Phosphatase: 125 U/L — ABNORMAL HIGH (ref 39–117)
BUN: 12 mg/dL (ref 6–23)
CO2: 33 mEq/L — ABNORMAL HIGH (ref 19–32)
Calcium: 9.2 mg/dL (ref 8.4–10.5)
Chloride: 102 mEq/L (ref 96–112)
Creatinine, Ser: 1.01 mg/dL (ref 0.40–1.20)
GFR: 63.24 mL/min (ref >60.00)
Glucose, Bld: 81 mg/dL (ref 70–99)
Potassium: 4.3 mEq/L (ref 3.5–5.1)
Sodium: 139 mEq/L (ref 135–145)
Total Bilirubin: 0.4 mg/dL (ref 0.2–1.2)
Total Protein: 7.2 g/dL (ref 6.0–8.3)

## 2020-06-18 LAB — LIPID PANEL
Cholesterol: 225 mg/dL — ABNORMAL HIGH (ref 0–200)
HDL: 87.9 mg/dL (ref >39.00)
LDL Cholesterol: 119 mg/dL — ABNORMAL HIGH (ref 0–99)
NonHDL: 137.12
Total CHOL/HDL Ratio: 3
Triglycerides: 91 mg/dL (ref 0.0–149.0)
VLDL: 18.2 mg/dL (ref 0.0–40.0)

## 2020-06-18 LAB — CBC
HCT: 38.9 % (ref 36.0–46.0)
Hemoglobin: 12.5 g/dL (ref 12.0–15.0)
MCHC: 32.2 g/dL (ref 30.0–36.0)
MCV: 81.4 fl (ref 78.0–100.0)
Platelets: 316 10*3/uL (ref 150.0–400.0)
RBC: 4.77 Mil/uL (ref 3.87–5.11)
RDW: 14.5 % (ref 11.5–15.5)
WBC: 5.9 10*3/uL (ref 4.0–10.5)

## 2020-06-18 LAB — HEMOGLOBIN A1C: Hgb A1c MFr Bld: 5.5 % (ref 4.6–6.5)

## 2020-06-18 NOTE — Assessment & Plan Note (Signed)
Flu shot up to date. Covid-19 up to date. Shingrix given 1st today. Tetanus up to date. Colonoscopy up to date. Mammogram up to date with gyn, pap smear up to date with gyn. Counseled about sun safety and mole surveillance. Counseled about the dangers of distracted driving. Given 10 year screening recommendations.

## 2020-06-18 NOTE — Assessment & Plan Note (Signed)
Using xanax prn and would like to continue given improvement in QOL.

## 2020-06-18 NOTE — Assessment & Plan Note (Signed)
BMI 39 but complicated by GERD, OSA, arthritis. S/P gastric sleeve.

## 2020-06-18 NOTE — Assessment & Plan Note (Signed)
Using symbicort prn.

## 2020-06-18 NOTE — Patient Instructions (Signed)
Health Maintenance, Female Adopting a healthy lifestyle and getting preventive care are important in promoting health and wellness. Ask your health care provider about:  The right schedule for you to have regular tests and exams.  Things you can do on your own to prevent diseases and keep yourself healthy. What should I know about diet, weight, and exercise? Eat a healthy diet   Eat a diet that includes plenty of vegetables, fruits, low-fat dairy products, and lean protein.  Do not eat a lot of foods that are high in solid fats, added sugars, or sodium. Maintain a healthy weight Body mass index (BMI) is used to identify weight problems. It estimates body fat based on height and weight. Your health care provider can help determine your BMI and help you achieve or maintain a healthy weight. Get regular exercise Get regular exercise. This is one of the most important things you can do for your health. Most adults should:  Exercise for at least 150 minutes each week. The exercise should increase your heart rate and make you sweat (moderate-intensity exercise).  Do strengthening exercises at least twice a week. This is in addition to the moderate-intensity exercise.  Spend less time sitting. Even light physical activity can be beneficial. Watch cholesterol and blood lipids Have your blood tested for lipids and cholesterol at 54 years of age, then have this test every 5 years. Have your cholesterol levels checked more often if:  Your lipid or cholesterol levels are high.  You are older than 54 years of age.  You are at high risk for heart disease. What should I know about cancer screening? Depending on your health history and family history, you may need to have cancer screening at various ages. This may include screening for:  Breast cancer.  Cervical cancer.  Colorectal cancer.  Skin cancer.  Lung cancer. What should I know about heart disease, diabetes, and high blood  pressure? Blood pressure and heart disease  High blood pressure causes heart disease and increases the risk of stroke. This is more likely to develop in people who have high blood pressure readings, are of African descent, or are overweight.  Have your blood pressure checked: ? Every 3-5 years if you are 18-39 years of age. ? Every year if you are 40 years old or older. Diabetes Have regular diabetes screenings. This checks your fasting blood sugar level. Have the screening done:  Once every three years after age 40 if you are at a normal weight and have a low risk for diabetes.  More often and at a younger age if you are overweight or have a high risk for diabetes. What should I know about preventing infection? Hepatitis B If you have a higher risk for hepatitis B, you should be screened for this virus. Talk with your health care provider to find out if you are at risk for hepatitis B infection. Hepatitis C Testing is recommended for:  Everyone born from 1945 through 1965.  Anyone with known risk factors for hepatitis C. Sexually transmitted infections (STIs)  Get screened for STIs, including gonorrhea and chlamydia, if: ? You are sexually active and are younger than 54 years of age. ? You are older than 54 years of age and your health care provider tells you that you are at risk for this type of infection. ? Your sexual activity has changed since you were last screened, and you are at increased risk for chlamydia or gonorrhea. Ask your health care provider if   you are at risk.  Ask your health care provider about whether you are at high risk for HIV. Your health care provider may recommend a prescription medicine to help prevent HIV infection. If you choose to take medicine to prevent HIV, you should first get tested for HIV. You should then be tested every 3 months for as long as you are taking the medicine. Pregnancy  If you are about to stop having your period (premenopausal) and  you may become pregnant, seek counseling before you get pregnant.  Take 400 to 800 micrograms (mcg) of folic acid every day if you become pregnant.  Ask for birth control (contraception) if you want to prevent pregnancy. Osteoporosis and menopause Osteoporosis is a disease in which the bones lose minerals and strength with aging. This can result in bone fractures. If you are 65 years old or older, or if you are at risk for osteoporosis and fractures, ask your health care provider if you should:  Be screened for bone loss.  Take a calcium or vitamin D supplement to lower your risk of fractures.  Be given hormone replacement therapy (HRT) to treat symptoms of menopause. Follow these instructions at home: Lifestyle  Do not use any products that contain nicotine or tobacco, such as cigarettes, e-cigarettes, and chewing tobacco. If you need help quitting, ask your health care provider.  Do not use street drugs.  Do not share needles.  Ask your health care provider for help if you need support or information about quitting drugs. Alcohol use  Do not drink alcohol if: ? Your health care provider tells you not to drink. ? You are pregnant, may be pregnant, or are planning to become pregnant.  If you drink alcohol: ? Limit how much you use to 0-1 drink a day. ? Limit intake if you are breastfeeding.  Be aware of how much alcohol is in your drink. In the [place] one drink equals one 12 oz bottle of beer (355 mL), one 5 oz glass of wine (148 mL), or one 1 oz glass of hard liquor (44 mL). General instructions  Schedule regular health, dental, and eye exams.  Stay current with your vaccines.  Tell your health care provider if: ? You often feel depressed. ? You have ever been abused or do not feel safe at home. Summary  Adopting a healthy lifestyle and getting preventive care are important in promoting health and wellness.  Follow your health care provider's instructions about healthy  diet, exercising, and getting tested or screened for diseases.  Follow your health care provider's instructions on monitoring your cholesterol and blood pressure. This information is not intended to replace advice given to you by your health care provider. Make sure you discuss any questions you have with your health care provider. Document Revised: 07/17/2018 Document Reviewed: 07/17/2018 Elsevier Patient Education  2020 Elsevier Inc.  

## 2020-06-18 NOTE — Progress Notes (Signed)
   Subjective:   Patient ID: Alicia Chen, female    DOB: 01-Apr-1966, 54 y.o.   MRN: [id number]  HPI The patient is a 54 YO coming in for physical.   PMH, FMHm, social history reviewed and updated  Colonoscopy January 11 2017  Lucienne Capers [facility] in care everywhere  Review of Systems  Constitutional: Negative.   HENT: Negative.   Eyes: Negative.   Respiratory: Negative for cough, chest tightness and shortness of breath.   Cardiovascular: Negative for chest pain, palpitations and leg swelling.  Gastrointestinal: Negative for abdominal distention, abdominal pain, constipation, diarrhea, nausea and vomiting.  Musculoskeletal: Negative.   Skin: Negative.   Neurological: Negative.   Psychiatric/Behavioral: Negative.     Objective:  Physical Exam Constitutional:      Appearance: She is well-developed.  HENT:     Head: Normocephalic and atraumatic.  Cardiovascular:     Rate and Rhythm: Normal rate and regular rhythm.  Pulmonary:     Effort: Pulmonary effort is normal. No respiratory distress.     Breath sounds: Normal breath sounds. No wheezing or rales.  Abdominal:     General: Bowel sounds are normal. There is no distension.     Palpations: Abdomen is soft.     Tenderness: There is no abdominal tenderness. There is no rebound.  Musculoskeletal:     Cervical back: Normal range of motion.  Skin:    General: Skin is warm and dry.  Neurological:     Mental Status: She is alert and oriented to person, place, and time.     Coordination: Coordination normal.     Vitals:   06/18/20 0844  BP: 140/90  Pulse: 69  Temp: 98 F (36.7 C)  TempSrc: Oral  SpO2: 99%  Weight: 217 lb 9.6 oz (98.7 kg)  Height: 5\' 2"  (1.575 m)    This visit occurred during the SARS-CoV-2 public health emergency.  Safety protocols were in place, including screening questions prior to the visit, additional usage of staff PPE, and extensive cleaning of exam room while observing appropriate  contact time as indicated for disinfecting solutions.   Assessment & Plan:  Shingrix IM given at visit

## 2020-06-18 NOTE — Assessment & Plan Note (Signed)
Taking prilosec daily with good control.  

## 2020-08-10 ENCOUNTER — Other Ambulatory Visit: Payer: Self-pay | Admitting: Internal Medicine

## 2020-08-11 ENCOUNTER — Other Ambulatory Visit: Payer: Self-pay | Admitting: Internal Medicine

## 2020-08-11 NOTE — Telephone Encounter (Signed)
Please advise in PCP's absence. Thanks!  [place] Controlled Database Checked Last filled:  10/21/19  # 30 LOV: 06/18/20 Next appt: 06/20/21

## 2020-08-12 ENCOUNTER — Encounter: Payer: Self-pay | Admitting: Internal Medicine

## 2020-08-16 ENCOUNTER — Encounter: Payer: Self-pay | Admitting: Internal Medicine

## 2020-08-16 ENCOUNTER — Other Ambulatory Visit: Payer: Self-pay

## 2020-08-16 ENCOUNTER — Telehealth (INDEPENDENT_AMBULATORY_CARE_PROVIDER_SITE_OTHER): Payer: 59 | Admitting: Internal Medicine

## 2020-08-16 DIAGNOSIS — J4541 Moderate persistent asthma with (acute) exacerbation: Secondary | ICD-10-CM | POA: Diagnosis not present

## 2020-08-16 MED ORDER — DOXYCYCLINE HYCLATE 100 MG PO TABS: 100.0000 mg | ORAL_TABLET | Freq: Two times a day (BID) | ORAL | 0 refills | Status: DC

## 2020-08-16 MED ORDER — ALBUTEROL SULFATE HFA 108 (90 BASE) MCG/ACT IN AERS: 1.0000 | INHALATION_SPRAY | RESPIRATORY_TRACT | 1 refills | Status: DC | PRN

## 2020-08-16 MED ORDER — HYDROCOD POLST-CPM POLST ER 10-8 MG/5ML PO SUER: 5.0000 mL | Freq: Two times a day (BID) | ORAL | 0 refills | Status: DC | PRN

## 2020-08-16 NOTE — Progress Notes (Signed)
Virtual Visit via Video Note  I connected with Alicia Chen on 08/16/20 at  1:45 PM EST by a video enabled telemedicine application and verified that I am speaking with the correct person using two identifiers.   I discussed the limitations of evaluation and management by telemedicine and the availability of in person appointments. The patient expressed understanding and agreed to proceed.  Present for the visit:  Myself, Dr Billey Gosling, Alicia Chen.  The patient is currently at home and I am in the office.    No referring provider.    History of Present Illness: This is an acute visit for Covid.  She was tested for Covid and +1/3.  She started with symptoms 12/28.  She had a bad cough, sneezing, nasal congestion.  She had night sweats.  By Friday she started feeling ok, but two days later she started coughing more with increased phlegm.  She has needed to use her inhaler.  She has a bad headache. She gets chest pain with her coughing.   She has asthma and sleep apnea.  She has had all 3 Covid vaccines.  Take zyrtec, mucinex, nyquil.     Review of Systems  Constitutional: Positive for diaphoresis. Negative for fever.  HENT: Positive for congestion (with PND) and sinus pain. Negative for ear pain (right ear clogged and draining) and sore throat.   Respiratory: Positive for cough, sputum production, shortness of breath (early last week) and wheezing (early last week).   Neurological: Positive for headaches.      Social History   Socioeconomic History  . Marital status: Divorced    Spouse name: Not on file  . Number of children: Not on file  . Years of education: Not on file  . Highest education level: Not on file  Occupational History  . Occupation: [employer]    Employer: [employer]  Tobacco Use  . Smoking status: Never Smoker  . Smokeless tobacco: Never Used  Vaping Use  . Vaping Use: Never used  Substance and Sexual Activity  . Alcohol  use: Yes    Comment: Social  . Drug use: No  . Sexual activity: Yes    Partners: Male    Birth control/protection: Condom  Other Topics Concern  . 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   Social Determinants of Health   Financial Resource Strain: Not on file  Food Insecurity: Not on file  Transportation Needs: Not on file  Physical Activity: Not on file  Stress: Not on file  Social Connections: Not on file     Observations/Objective: Appears well in NAD Breathing normally, intermittent coughing  Assessment and Plan:  Asthma exacerbation Still recovering from Covid and having increased symptoms of productive cough, wheezing, shortness of breath Using Symbicort twice daily and albuterol as needed Concern for secondary bacterial infection causing asthma exacerbation Continue current inhalers Start doxycycline 100 mg twice daily x10 days Tussionex cough syrup every 12 hours as needed At this time we both feel she does not need prednisone, but if symptoms do not improve or worsen we may need to consider that Over-the-counter cold medications for symptom relief Call if no improvement   Follow Up Instructions:    I discussed the assessment and treatment plan with the patient. The patient was provided an opportunity to ask questions and all were answered. The patient agreed with the plan and demonstrated an understanding of  the instructions.   The patient was advised to call back or seek an in-person evaluation if the symptoms worsen or if the condition fails to improve as anticipated.    Binnie Rail, MD

## 2020-08-20 ENCOUNTER — Ambulatory Visit: Payer: 59

## 2020-08-27 ENCOUNTER — Other Ambulatory Visit: Payer: Self-pay

## 2020-08-27 ENCOUNTER — Ambulatory Visit (INDEPENDENT_AMBULATORY_CARE_PROVIDER_SITE_OTHER): Payer: 59

## 2020-08-27 DIAGNOSIS — Z23 Encounter for immunization: Secondary | ICD-10-CM

## 2020-11-13 ENCOUNTER — Other Ambulatory Visit: Payer: Self-pay | Admitting: Internal Medicine

## 2020-11-18 ENCOUNTER — Other Ambulatory Visit: Payer: Self-pay

## 2020-11-18 ENCOUNTER — Encounter: Payer: Self-pay | Admitting: Internal Medicine

## 2020-11-18 ENCOUNTER — Ambulatory Visit: Payer: 59 | Admitting: Internal Medicine

## 2020-11-18 ENCOUNTER — Ambulatory Visit
Admission: RE | Admit: 2020-11-18 | Discharge: 2020-11-18 | Disposition: A | Discharge status: Home / Self Care | Payer: 59 | Source: Ambulatory Visit | Attending: Internal Medicine | Admitting: Internal Medicine

## 2020-11-18 DIAGNOSIS — R109 Unspecified abdominal pain: Secondary | ICD-10-CM | POA: Insufficient documentation

## 2020-11-18 DIAGNOSIS — R1011 Right upper quadrant pain: Secondary | ICD-10-CM

## 2020-11-18 DIAGNOSIS — K5909 Other constipation: Secondary | ICD-10-CM | POA: Diagnosis not present

## 2020-11-18 LAB — CBC WITH DIFFERENTIAL/PLATELET
Basophils Absolute: 0 10*3/uL (ref 0.0–0.1)
Basophils Relative: 0.3 % (ref 0.0–3.0)
Eosinophils Absolute: 0.1 10*3/uL (ref 0.0–0.7)
Eosinophils Relative: 0.9 % (ref 0.0–5.0)
HCT: 38.4 % (ref 36.0–46.0)
Hemoglobin: 12.3 g/dL (ref 12.0–15.0)
Lymphocytes Relative: 33.1 % (ref 12.0–46.0)
Lymphs Abs: 2.1 10*3/uL (ref 0.7–4.0)
MCHC: 32.1 g/dL (ref 30.0–36.0)
MCV: 80.6 fl (ref 78.0–100.0)
Monocytes Absolute: 0.5 10*3/uL (ref 0.1–1.0)
Monocytes Relative: 8.1 % (ref 3.0–12.0)
Neutro Abs: 3.6 10*3/uL (ref 1.4–7.7)
Neutrophils Relative %: 57.6 % (ref 43.0–77.0)
Platelets: 341 10*3/uL (ref 150.0–400.0)
RBC: 4.76 Mil/uL (ref 3.87–5.11)
RDW: 14.6 % (ref 11.5–15.5)
WBC: 6.2 10*3/uL (ref 4.0–10.5)

## 2020-11-18 LAB — BASIC METABOLIC PANEL
BUN: 21 mg/dL (ref 6–23)
CO2: 32 mEq/L (ref 19–32)
Calcium: 9.5 mg/dL (ref 8.4–10.5)
Chloride: 101 mEq/L (ref 96–112)
Creatinine, Ser: 1.09 mg/dL (ref 0.40–1.20)
GFR: 57.55 mL/min — ABNORMAL LOW (ref >60.00)
Glucose, Bld: 81 mg/dL (ref 70–99)
Potassium: 4.1 mEq/L (ref 3.5–5.1)
Sodium: 139 mEq/L (ref 135–145)

## 2020-11-18 LAB — URINALYSIS, ROUTINE W REFLEX MICROSCOPIC
Bilirubin Urine: NEGATIVE
Ketones, ur: NEGATIVE
Leukocytes,Ua: NEGATIVE
Nitrite: NEGATIVE
Specific Gravity, Urine: 1.01 (ref 1.000–1.030)
Total Protein, Urine: NEGATIVE
Urine Glucose: NEGATIVE
Urobilinogen, UA: 0.2 (ref 0.0–1.0)
pH: 7 (ref 5.0–8.0)

## 2020-11-18 LAB — HEPATIC FUNCTION PANEL
ALT: 11 U/L (ref 0–35)
AST: 15 U/L (ref 0–37)
Albumin: 3.8 g/dL (ref 3.5–5.2)
Alkaline Phosphatase: 126 U/L — ABNORMAL HIGH (ref 39–117)
Bilirubin, Direct: 0 mg/dL (ref 0.0–0.3)
Total Bilirubin: 0.4 mg/dL (ref 0.2–1.2)
Total Protein: 7.4 g/dL (ref 6.0–8.3)

## 2020-11-18 LAB — LIPASE: Lipase: 24 U/L (ref 11.0–59.0)

## 2020-11-18 MED ORDER — LINACLOTIDE 290 MCG PO CAPS: 290.0000 ug | ORAL_CAPSULE | Freq: Every day | ORAL | 1 refills | Status: DC

## 2020-11-18 NOTE — Assessment & Plan Note (Signed)
Ok for restart linzess 290 pending ct results

## 2020-11-18 NOTE — Progress Notes (Signed)
Patient ID: Alicia Chen, female   DOB: [birth year], 55 y.o.   MRN: [id number]        Chief Complaint: right flank pain       HPI:  Alicia Chen is a 55 y.o. female here with c/o persistent right flank and side pain for at least 3 wks; she has a follow up with her bariatric surgeon about 3 wks ago and mentioned some mild discomfort and by report the surgeon mentioned renal stone might be a cause; then mon apr 11 the right flank and side became more frequent, severe crampy like, involving the flank and extending to the right side and RUQ area; pt is s/p CCX and sleeve gastric procedure several years ago.  Has also had chronic constipatoin, often taking linzess total 290 per day, but ran out 2 mo ago and not authorized again for refill.  Has tried the bariatric surgeon suggestion of metamucil and miralax combination daily but has not helped.  Did have some small relief briefly with dulcolox once last wk.  Has also had some nause, no vomiting, no fever or chills or blood.  Denies urinary symptoms such as dysuria, frequency, urgency,  hematuria or n/v, fever, chills.  After bariatric surgury wt has been as low as 150 but regained some;  Peak wt has been about 300 in past.        Wt Readings from Last 3 Encounters:  11/18/20 223 lb (101.2 kg)  06/18/20 217 lb 9.6 oz (98.7 kg)  04/15/20 210 lb (95.3 kg)   BP Readings from Last 3 Encounters:  11/18/20 128/82  06/18/20 140/90  03/30/20 136/82         Past Medical History:  Diagnosis Date  . ALLERGIC RHINITIS   . ANEMIA-NOS   . ARTHRITIS   . ASTHMA   . DEPRESSION   . Fibroid   . GERD   . MIGRAINE HEADACHE   . OBESITY, MORBID   . Vitamin D deficiency    Past Surgical History:  Procedure Laterality Date  . CHOLECYSTECTOMY  2009  . ESOPHAGOGASTRODUODENOSCOPY N/A 12/23/2012   Procedure: ESOPHAGOGASTRODUODENOSCOPY (EGD);  Surgeon: Juanita Craver, MD;  Location: [facility];  Service: Endoscopy;  Laterality: N/A;  . INTRAUTERINE DEVICE (IUD)  INSERTION  12-15-13   Mirena  . [place] RESECTION  2/17  . ROOT CANAL    . Skin graft surgery (R) leg     childhood    reports that she has never smoked. She has never used smokeless tobacco. She reports current alcohol use. She reports that she does not use drugs. family history includes Alcohol abuse in an other family member; Arthritis in her father, mother, and another family member; Breast cancer in her paternal aunt, sister, and sister; Diabetes in an other family member; Hyperlipidemia in an other family member; Hypertension in an other family member; Prostate cancer in an other family member. Allergies  Allergen Reactions  . Penicillins Nausea And Vomiting    hives   Current Outpatient Medications on File Prior to Visit  Medication Sig Dispense Refill  . albuterol (VENTOLIN HFA) 108 (90 Base) MCG/ACT inhaler Inhale 1-2 puffs into the lungs every 4 (four) hours as needed for wheezing or shortness of breath. 18 g 1  . ALPRAZolam (XANAX) 0.5 MG tablet TAKE 1 TABLET(0.5 MG) BY MOUTH DAILY AS NEEDED FOR ANXIETY 30 tablet 2  . budesonide-formoterol (SYMBICORT) 160-4.5 MCG/ACT inhaler INHALE 2 PUFFS INTO THE LUNGS TWICE DAILY 10.2 g 2  .  Cholecalciferol (VITAMIN D) 2000 units tablet Take 2,000 Units by mouth daily.    . ciclopirox (LOPROX) 0.77 % cream APPLY TOPICALLY TO THE AFFECTED AREA TWICE DAILY    . citalopram (CELEXA) 20 MG tablet Take 1 tablet a day. 90 tablet 3  . famotidine (PEPCID) 20 MG tablet Take 1 tablet (20 mg total) by mouth 2 (two) times daily as needed for heartburn or indigestion. 180 tablet 3  . loratadine (CLARITIN) 10 MG tablet Take 1 tablet (10 mg total) by mouth daily. 90 tablet 3  . meclizine (ANTIVERT) 25 MG tablet Take 1 tablet (25 mg total) by mouth 3 (three) times daily as needed for dizziness. 30 tablet 0  . Multiple Vitamin (MULTIVITAMIN) tablet Take 1 tablet by mouth daily.    Marland Kitchen nystatin ointment (MYCOSTATIN) APPLY TOPICALLY TWICE DAILY  90 g 3  . omeprazole (PRILOSEC) 20 MG capsule Take 1 capsule (20 mg total) by mouth daily. 90 capsule 3  . phentermine (ADIPEX-P) 37.5 MG tablet Take 37.5 mg by mouth every morning.    . phentermine 15 MG capsule Take 1 capsule by mouth daily.    . SUMAtriptan (IMITREX) 50 MG tablet Take 1 tablet (50 mg total) by mouth every 2 (two) hours as needed for migraine. May repeat in 2 hours if headache persists or recurs. 10 tablet 5  . terbinafine (LAMISIL) 250 MG tablet Take 1 tablet (250 mg total) by mouth daily. 30 tablet 1  . chlorpheniramine-HYDROcodone (TUSSIONEX PENNKINETIC ER) 10-8 MG/5ML SUER Take 5 mLs by mouth every 12 (twelve) hours as needed for cough. (Patient not taking: Reported on 11/18/2020) 100 mL 0  . doxycycline (VIBRA-TABS) 100 MG tablet Take 1 tablet (100 mg total) by mouth 2 (two) times daily. (Patient not taking: Reported on 11/18/2020) 20 tablet 0   No current facility-administered medications on file prior to visit.        ROS:  All others reviewed and negative.  Objective        PE:  BP 128/82 (BP Location: Left Arm, Patient Position: Sitting, Cuff Size: Large)   Pulse 80   Temp 97.9 F (36.6 C) (Oral)   Ht 5\' 2"  (1.575 m)   Wt 223 lb (101.2 kg)   SpO2 99%   BMI 40.79 kg/m                 Constitutional: Pt appears in NAD               HENT: Head: NCAT.                Right Ear: External ear normal.                 Left Ear: External ear normal.                Eyes: . Pupils are equal, round, and reactive to light. Conjunctivae and EOM are normal               Nose: without d/c or deformity               Neck: Neck supple. Gross normal ROM               Cardiovascular: Normal rate and regular rhythm.                 Pulmonary/Chest: Effort normal and breath sounds without rales or wheezing.  Abd:  Soft, NT, ND, + BS, no organomegaly, no flank tender               Neurological: Pt is alert. At baseline orientation, motor grossly intact                Skin: Skin is warm. No rashes, no other new lesions, LE edema - trace chronic bilat               Psychiatric: Pt behavior is normal without agitation   Micro: none  Cardiac tracings I have personally interpreted today:  none  Pertinent Radiological findings (summarize): none   Lab Results  Component Value Date   WBC 5.9 06/18/2020   HGB 12.5 06/18/2020   HCT 38.9 06/18/2020   PLT 316.0 06/18/2020   GLUCOSE 81 06/18/2020   CHOL 225 (H) 06/18/2020   TRIG 91.0 06/18/2020   HDL 87.90 06/18/2020   LDLDIRECT 142.0 01/30/2013   LDLCALC 119 (H) 06/18/2020   ALT 10 06/18/2020   AST 16 06/18/2020   NA 139 06/18/2020   K 4.3 06/18/2020   CL 102 06/18/2020   CREATININE 1.01 06/18/2020   BUN 12 06/18/2020   CO2 33 (H) 06/18/2020   TSH 1.08 02/11/2019   HGBA1C 5.5 06/18/2020   Assessment/Plan:  MEGHAM DWYER is a 55 y.o. Black or African American [2] female with  has a past medical history of ALLERGIC RHINITIS, ANEMIA-NOS, ARTHRITIS, ASTHMA, DEPRESSION, Fibroid, GERD, MIGRAINE HEADACHE, OBESITY, MORBID, and Vitamin D deficiency.  Right flank pain Associated with right side and RUQ pain (s/p ccx), denies GU symptoms and seems maybe related to constipation type GI pain, but given increased severity and frequency and failed tx so far, will need labs and CT as ordered - r/o renal stone  Chronic constipation Ok for restart linzess 290 pending ct results  Followup: Return if symptoms worsen or fail to improve.  Cathlean Cower, MD 11/18/2020 9:44 AM [facility]

## 2020-11-18 NOTE — Assessment & Plan Note (Addendum)
Associated with right side and RUQ pain (s/p ccx), denies GU symptoms and seems maybe related to constipation type GI pain, but given increased severity and frequency and failed tx so far, will need labs and CT as ordered - r/o renal stone

## 2020-11-18 NOTE — Patient Instructions (Addendum)
Ok to restart the linzess at 290 per day depending on the CT scan results  Please continue all other medications as before, and refills have been done if requested.  Please have the pharmacy call with any other refills you may need.  Please continue your efforts at being more active, low cholesterol diet, and weight control.  Please keep your appointments with your specialists as you may have planned  You will be contacted regarding the referral for: CT scan  - r/o kidney stone - to see PCCs before leave  Please go to the LAB at the blood drawing area for the tests to be done  You will be contacted by phone if any changes need to be made immediately.  Otherwise, you will receive a letter about your results with an explanation, but please check with MyChart first.  Please remember to sign up for MyChart if you have not done so, as this will be important to you in the future with finding out test results, communicating by private email, and scheduling acute appointments online when needed.

## 2020-11-18 NOTE — Addendum Note (Signed)
Addended by: Jacobo Forest on: 11/18/2020 09:56 AM   Modules accepted: Orders

## 2020-12-25 ENCOUNTER — Other Ambulatory Visit: Payer: Self-pay | Admitting: Internal Medicine

## 2021-03-21 NOTE — Progress Notes (Signed)
55 y.o. [id number] Divorced Black or African American Not Hispanic or Latino female here for annual exam. No vaginal bleeding. She has issues with constipation, has a GI MD  Patient states that she is tired and over whelmed. Fiance's sister died. Work is stressful. Stress with her daughters. H/O depression and anxiety, on 20 mg Celexa.  She isn't resting well, can't fall asleep. She is in therapy.   Her fiance got a kidney transplant in 3/22, doing well. Still trying to control his diabetes and HTN. They haven't been sexually active yet. Hope to be, still dealing with ED.   H/O fibroid uterus.     Patient's last menstrual period was 01/18/2014.          Sexually active: No.  The current method of family planning is post menopausal status.    Exercising: No.  The patient does not participate in regular exercise at present. Smoker:  no  Health Maintenance: Pap:  02/14/16 wnl Hr Hpv Neg  History of abnormal Pap:  no MMG:  04/06/20 density B Bi-rads 1 neg  BMD:   none  Colonoscopy: 2018, f/u 10 years TDaP:  02/27/18 Gardasil: na   reports that she has never smoked. She has never used smokeless tobacco. She reports current alcohol use. She reports that she does not use drugs. 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 Work is stressful. She has twin daughters, 62. One of her daughters has Crohn's disease. They live with each other here in [place]  Past Medical History:  Diagnosis Date   ALLERGIC RHINITIS    ANEMIA-NOS    ARTHRITIS    ASTHMA    DEPRESSION    Fibroid    GERD    MIGRAINE HEADACHE    OBESITY, MORBID    Vitamin D deficiency     Past Surgical History:  Procedure Laterality Date   CHOLECYSTECTOMY  2009   ESOPHAGOGASTRODUODENOSCOPY N/A 12/23/2012   Procedure: ESOPHAGOGASTRODUODENOSCOPY (EGD);  Surgeon: Juanita Craver, MD;  Location: [facility];  Service: Endoscopy;  Laterality: N/A;   INTRAUTERINE DEVICE (IUD) INSERTION  12-15-13   Mirena    LAPAROSCOPIC GASTRIC SLEEVE RESECTION  2/17   ROOT CANAL     Skin graft surgery (R) leg     childhood    Current Outpatient Medications  Medication Sig Dispense Refill   albuterol (VENTOLIN HFA) 108 (90 Base) MCG/ACT inhaler Inhale 1-2 puffs into the lungs every 4 (four) hours as needed for wheezing or shortness of breath. 18 g 1   budesonide-formoterol (SYMBICORT) 160-4.5 MCG/ACT inhaler INHALE 2 PUFFS INTO THE LUNGS TWICE DAILY 10.2 g 2   Cholecalciferol (VITAMIN D) 2000 units tablet Take 2,000 Units by mouth daily.     ciclopirox (LOPROX) 0.77 % cream APPLY TOPICALLY TO THE AFFECTED AREA TWICE DAILY     citalopram (CELEXA) 20 MG tablet Take 1 tablet a day. 90 tablet 3   famotidine (PEPCID) 20 MG tablet Take 1 tablet (20 mg total) by mouth 2 (two) times daily as needed for heartburn or indigestion. 180 tablet 3   linaclotide (LINZESS) 290 MCG CAPS capsule Take 1 capsule (290 mcg total) by mouth daily before breakfast. 90 capsule 1   loratadine (CLARITIN) 10 MG tablet Take 1 tablet (10 mg total) by mouth daily. 90 tablet 3   meclizine (ANTIVERT) 25 MG tablet Take 1 tablet (25 mg total) by mouth 3 (three) times daily as needed for dizziness. 30 tablet 0   Multiple Vitamin (MULTIVITAMIN)  tablet Take 1 tablet by mouth daily.     nystatin ointment (MYCOSTATIN) APPLY TOPICALLY TWICE DAILY 90 g 3   omeprazole (PRILOSEC) 20 MG capsule TAKE 1 CAPSULE(20 MG) BY MOUTH DAILY 90 capsule 3   phentermine (ADIPEX-P) 37.5 MG tablet Take 37.5 mg by mouth every morning.     SUMAtriptan (IMITREX) 50 MG tablet Take 1 tablet (50 mg total) by mouth every 2 (two) hours as needed for migraine. May repeat in 2 hours if headache persists or recurs. 10 tablet 5   terbinafine (LAMISIL) 250 MG tablet Take 1 tablet (250 mg total) by mouth daily. 30 tablet 1   No current facility-administered medications for this visit.    Family History  Problem Relation Age of Onset   Arthritis Mother    Arthritis Father     Alcohol abuse Other        Parents   Arthritis Other        grandparents   Diabetes Other        Parent & grandparent   Hyperlipidemia Other        Parent, grandparent, other relative   Hypertension Other        parent, grandparent, other relative   Prostate cancer Other        grandfather   Breast cancer Sister    Breast cancer Sister    Breast cancer Paternal Aunt     Review of Systems  All other systems reviewed and are negative.  Exam:   BP 130/72   Pulse 83   Ht '5\' 3"'$  (1.6 m)   Wt 222 lb (100.7 kg)   LMP 01/18/2014   SpO2 100%   BMI 39.33 kg/m   Weight change: '@WEIGHTCHANGE'$ @ Height:   Height: '5\' 3"'$  (160 cm)  Ht Readings from Last 3 Encounters:  03/23/21 '5\' 3"'$  (1.6 m)  11/18/20 '5\' 2"'$  (1.575 m)  06/18/20 '5\' 2"'$  ([id number] m)    General appearance: alert, cooperative and appears stated age Head: Normocephalic, without obvious abnormality, atraumatic Neck: no adenopathy, supple, symmetrical, trachea midline and thyroid normal to inspection and palpation Lungs: clear to auscultation bilaterally Cardiovascular: regular rate and rhythm Breasts: normal appearance, no masses or tenderness Abdomen: soft, non-tender; non distended,  no masses,  no organomegaly Extremities: extremities normal, atraumatic, no cyanosis or edema Skin: Skin color, texture, turgor normal. No rashes or lesions Lymph nodes: Cervical, supraclavicular, and axillary nodes normal. No abnormal inguinal nodes palpated Neurologic: Grossly normal   Pelvic: External genitalia:  no lesions              Urethra:  normal appearing urethra with no masses, tenderness or lesions              Bartholins and Skenes: normal                 Vagina: normal appearing vagina with normal color and discharge, no lesions              Cervix: no lesions               Bimanual Exam:  Uterus:   retroverted, irregular, slightly enlarged (c/w fibroid uterus)              Adnexa: no mass, fullness, tenderness                Rectovaginal: Confirms               Anus:  normal sphincter tone, no lesions  Eustace Pen  Doyle Askew chaperoned for the exam.  1. Well woman exam Discussed breast self exam Discussed calcium and vit D intake Labs with primary She will schedule her mammogram Colonoscopy UTD  2. Depression with anxiety Worsened anxiety, will increase her Celexa. F/u with therapist - citalopram (CELEXA) 20 MG tablet; Take 1 tablet a day. Take with the 10 mg of Celexa for a total dose of 30 mg a day  Dispense: 90 tablet; Refill: 3 - citalopram (CELEXA) 10 MG tablet; Take 1 tablet (10 mg total) by mouth daily. Take with the 20 mg of celexa for a total dose of 30 mg  Dispense: 90 tablet; Refill: 3  3. Screening for cervical cancer - Cytology - PAP

## 2021-03-23 ENCOUNTER — Other Ambulatory Visit (HOSPITAL_COMMUNITY)
Admission: RE | Admit: 2021-03-23 | Discharge: 2021-03-23 | Disposition: A | Discharge status: Home / Self Care | Payer: 59 | Source: Ambulatory Visit | Attending: Obstetrics and Gynecology | Admitting: Obstetrics and Gynecology

## 2021-03-23 ENCOUNTER — Other Ambulatory Visit: Payer: Self-pay

## 2021-03-23 ENCOUNTER — Ambulatory Visit (INDEPENDENT_AMBULATORY_CARE_PROVIDER_SITE_OTHER): Payer: 59 | Admitting: Obstetrics and Gynecology

## 2021-03-23 ENCOUNTER — Encounter: Payer: Self-pay | Admitting: Obstetrics and Gynecology

## 2021-03-23 VITALS — BP 130/72 | HR 83 | Ht 63.0 in | Wt 222.0 lb

## 2021-03-23 DIAGNOSIS — F418 Other specified anxiety disorders: Secondary | ICD-10-CM | POA: Diagnosis not present

## 2021-03-23 DIAGNOSIS — Z01419 Encounter for gynecological examination (general) (routine) without abnormal findings: Secondary | ICD-10-CM | POA: Diagnosis not present

## 2021-03-23 DIAGNOSIS — Z124 Encounter for screening for malignant neoplasm of cervix: Secondary | ICD-10-CM | POA: Diagnosis present

## 2021-03-23 MED ORDER — CITALOPRAM HYDROBROMIDE 10 MG PO TABS: 10.0000 mg | ORAL_TABLET | Freq: Every day | ORAL | 3 refills | Status: DC

## 2021-03-23 MED ORDER — CITALOPRAM HYDROBROMIDE 20 MG PO TABS: ORAL_TABLET | ORAL | 3 refills | Status: DC

## 2021-03-23 NOTE — Patient Instructions (Signed)

## 2021-03-24 LAB — CYTOLOGY - PAP
Comment: NEGATIVE
Diagnosis: NEGATIVE
High risk HPV: NEGATIVE

## 2021-03-26 IMAGING — MG DIGITAL SCREENING BILAT W/ TOMO W/ CAD
8 series · 8 of 24 positions shown · non-contrast
Comparison: Previous exam(s).

CLINICAL DATA: Screening.

EXAM:
DIGITAL SCREENING BILATERAL MAMMOGRAM WITH TOMO AND CAD

[R CC synth-2D]
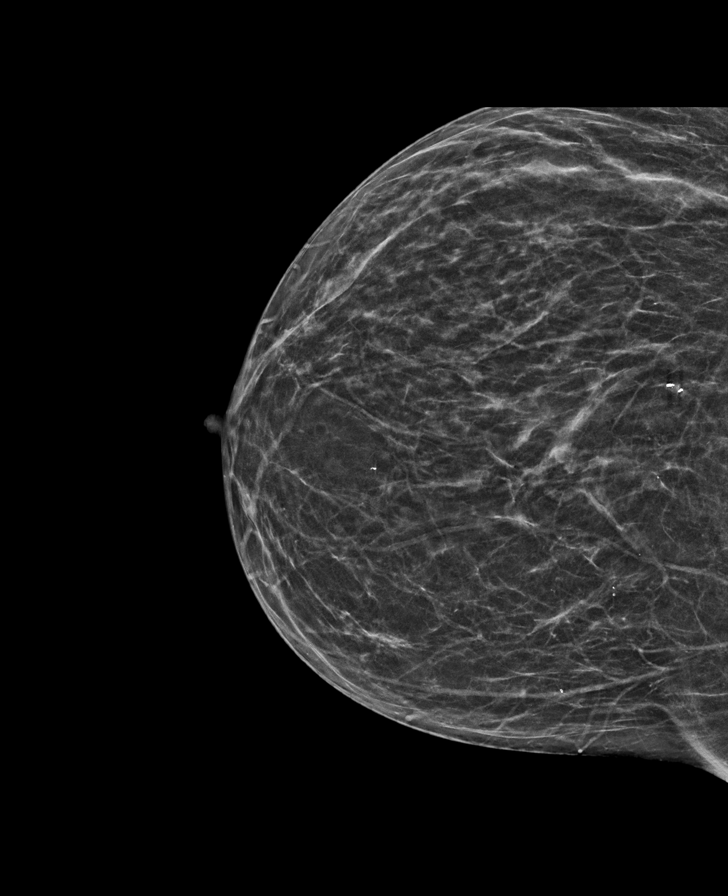

[L MLO synth-2D]
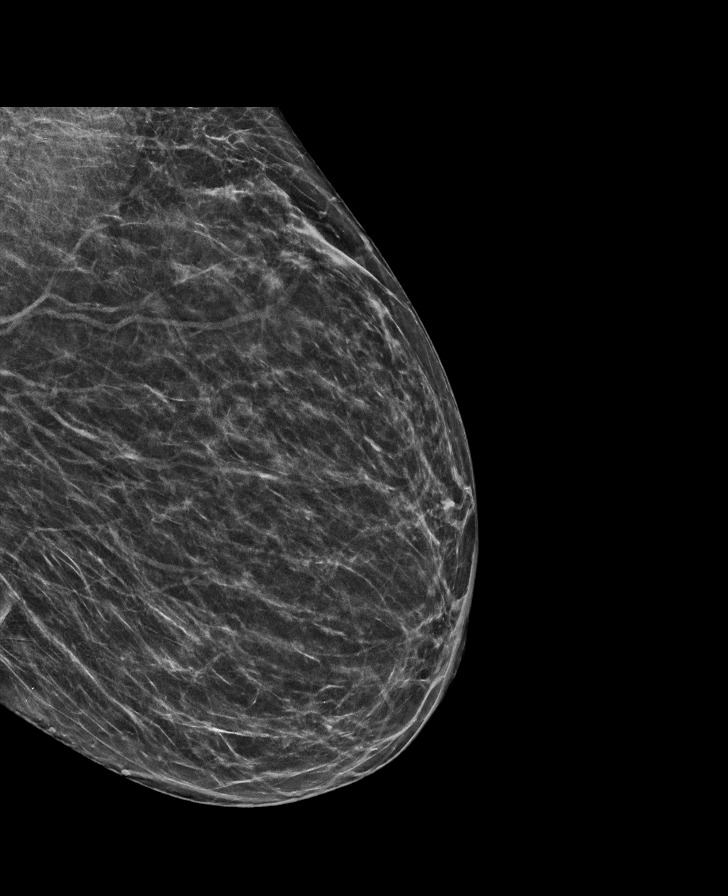

[L CC synth-2D]
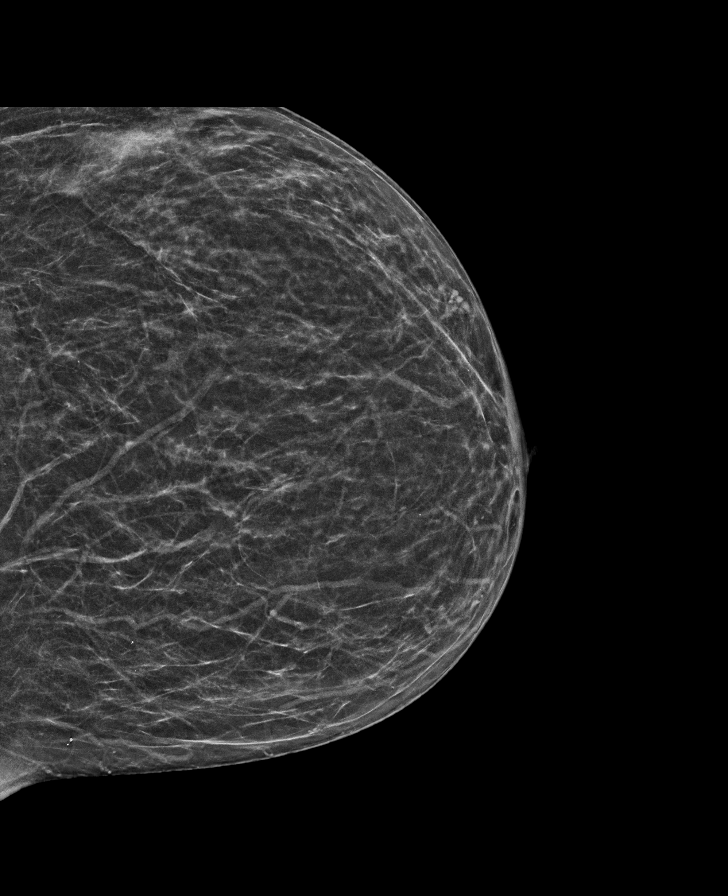

[R MLO synth-2D]
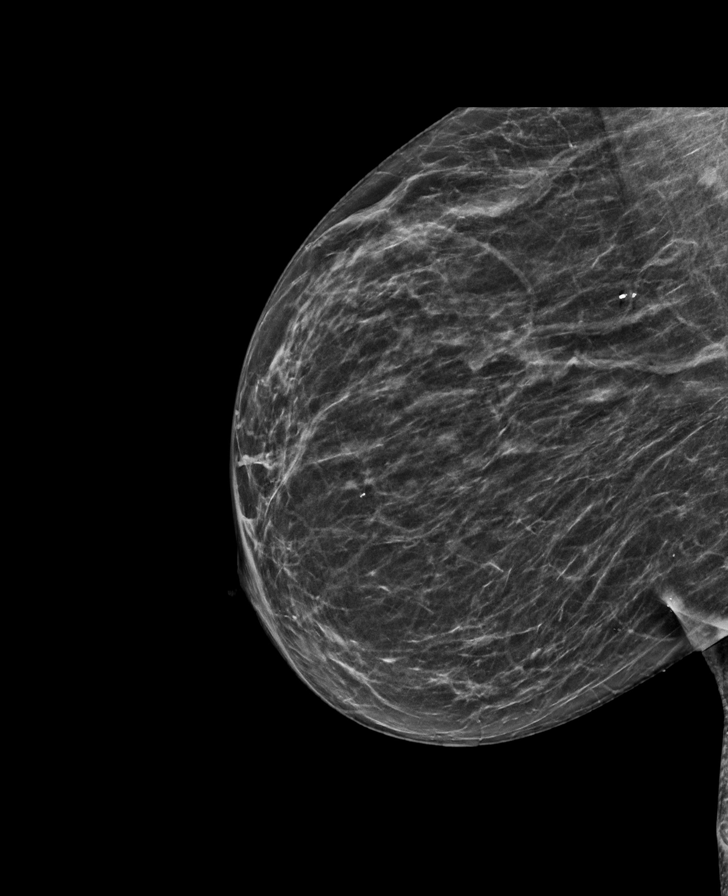

[L MLO tomo · tomo slice 29/58.0]
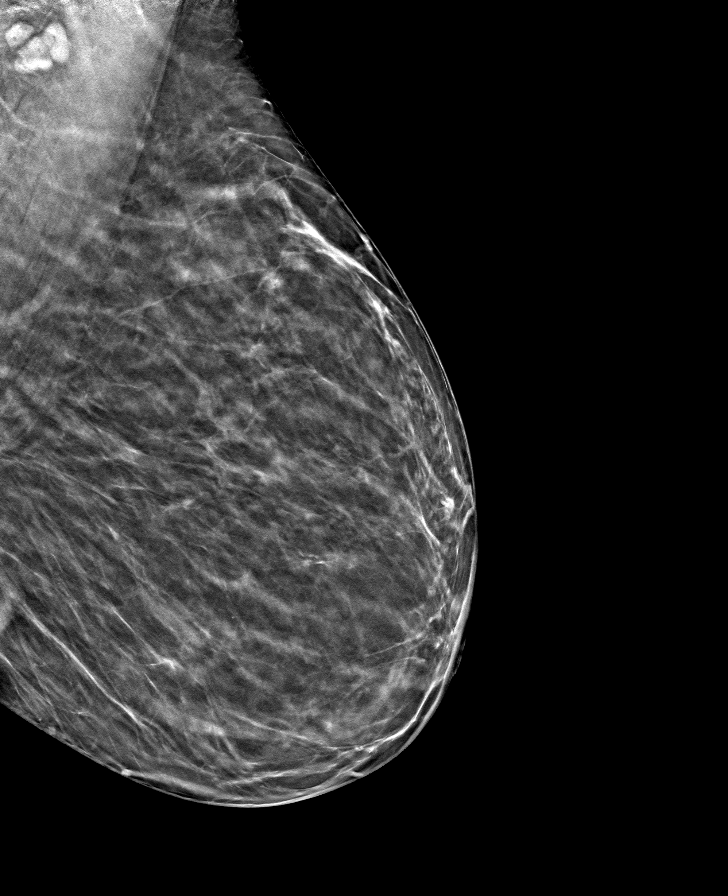

[L CC tomo · tomo slice 27/53.0]
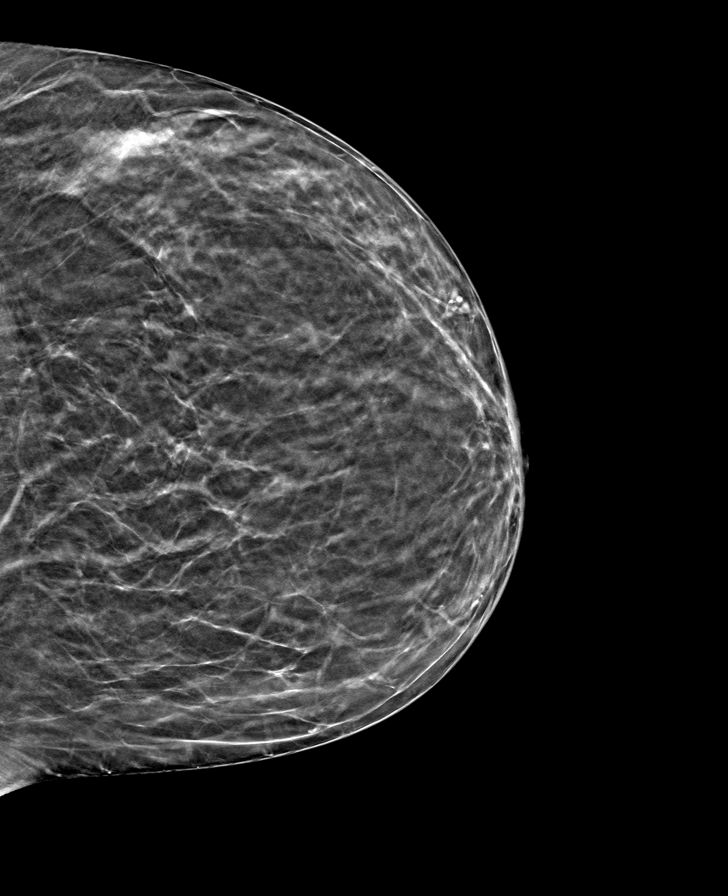

[R MLO tomo · tomo slice 31/61.0]
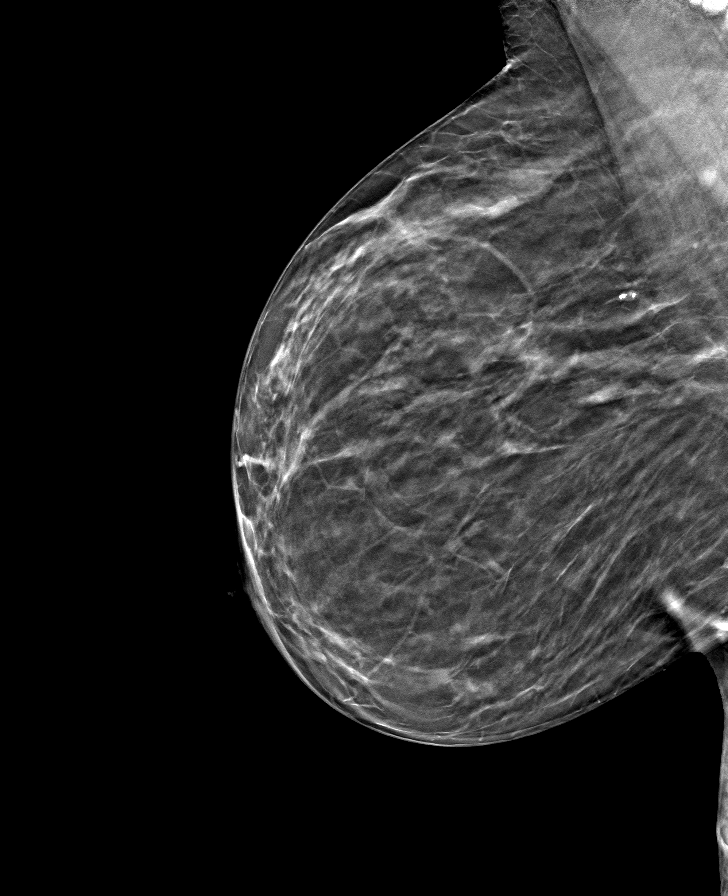

[R CC tomo · tomo slice 27/52.0]
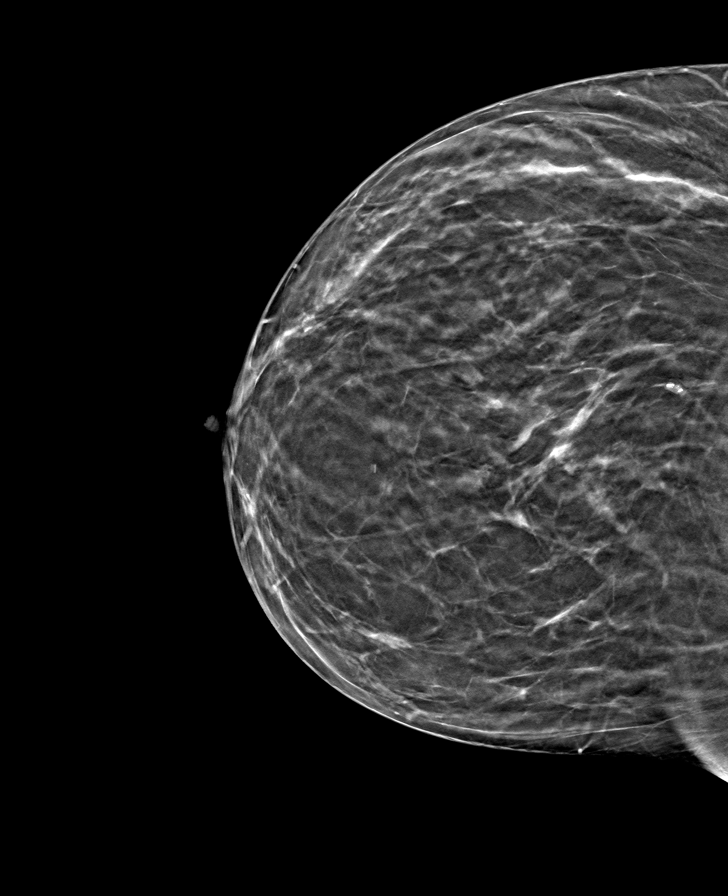

[8 of 24 positions shown; findings below may reference images not displayed]

ACR Breast Density Category b: There are scattered areas of
fibroglandular density.
FINDINGS: There are no findings suspicious for malignancy. Images were
processed with CAD.
IMPRESSION: No mammographic evidence of malignancy. A result letter of this
screening mammogram will be mailed directly to the patient.

RECOMMENDATION:
Screening mammogram in one year. (Code:[id number])

BI-RADS CATEGORY  1: Negative.

## 2021-04-06 ENCOUNTER — Other Ambulatory Visit: Payer: Self-pay | Admitting: Internal Medicine

## 2021-04-06 DIAGNOSIS — Z1231 Encounter for screening mammogram for malignant neoplasm of breast: Secondary | ICD-10-CM

## 2021-04-07 ENCOUNTER — Other Ambulatory Visit: Payer: Self-pay

## 2021-04-07 ENCOUNTER — Ambulatory Visit
Admission: RE | Admit: 2021-04-07 | Discharge: 2021-04-07 | Disposition: A | Discharge status: Home / Self Care | Payer: 59 | Source: Ambulatory Visit | Attending: Internal Medicine | Admitting: Internal Medicine

## 2021-04-07 DIAGNOSIS — Z1231 Encounter for screening mammogram for malignant neoplasm of breast: Secondary | ICD-10-CM

## 2021-05-11 ENCOUNTER — Other Ambulatory Visit: Payer: Self-pay | Admitting: *Deleted

## 2021-05-11 DIAGNOSIS — F418 Other specified anxiety disorders: Secondary | ICD-10-CM

## 2021-05-11 NOTE — Telephone Encounter (Signed)
I called and spoke with pharmacist and was informed patient has celexa 20 mg tablet refills at the pharmacy.

## 2021-06-03 ENCOUNTER — Other Ambulatory Visit: Payer: Self-pay

## 2021-06-03 ENCOUNTER — Ambulatory Visit: Payer: 59 | Admitting: Internal Medicine

## 2021-06-03 ENCOUNTER — Ambulatory Visit (INDEPENDENT_AMBULATORY_CARE_PROVIDER_SITE_OTHER): Payer: 59

## 2021-06-03 ENCOUNTER — Encounter: Payer: Self-pay | Admitting: Internal Medicine

## 2021-06-03 VITALS — BP 126/88 | HR 76 | Resp 18 | Ht 63.0 in | Wt 231.2 lb

## 2021-06-03 DIAGNOSIS — M25562 Pain in left knee: Secondary | ICD-10-CM | POA: Diagnosis not present

## 2021-06-03 DIAGNOSIS — M5416 Radiculopathy, lumbar region: Secondary | ICD-10-CM | POA: Insufficient documentation

## 2021-06-03 DIAGNOSIS — M25552 Pain in left hip: Secondary | ICD-10-CM | POA: Diagnosis not present

## 2021-06-03 DIAGNOSIS — M5442 Lumbago with sciatica, left side: Secondary | ICD-10-CM

## 2021-06-03 MED ORDER — MELOXICAM 15 MG PO TABS: 15.0000 mg | ORAL_TABLET | Freq: Every day | ORAL | 0 refills | Status: DC

## 2021-06-03 NOTE — Assessment & Plan Note (Signed)
X-ray left knee. Rx meloxicam for pain. No tear detected on exam but more tenderness LCL and MCL so could be partial tear or sprain there causing extended timeline of the pain. Avoid heels.

## 2021-06-03 NOTE — Patient Instructions (Signed)
We will check the x-rays to make sure no injury.  We have sent in meloxicam to take 1 pill daily for 2-4 weeks.

## 2021-06-03 NOTE — Assessment & Plan Note (Addendum)
New problem. Given fall and persistent pain about 1 month later checking x-ray hip. Rx meloxicam.

## 2021-06-03 NOTE — Progress Notes (Signed)
   Subjective:   Patient ID: Alicia Chen, female    DOB: August 18, 1965, 55 y.o.   MRN: [id number]  HPI Patient coming in for left leg and low back pain after fall 1 month ago. Overall improving about 50% better. Still swelling with activity.   Review of Systems  Constitutional: Negative.   HENT: Negative.    Eyes: Negative.   Respiratory:  Negative for cough, chest tightness and shortness of breath.   Cardiovascular:  Negative for chest pain, palpitations and leg swelling.  Gastrointestinal:  Negative for abdominal distention, abdominal pain, constipation, diarrhea, nausea and vomiting.  Musculoskeletal:  Positive for arthralgias, back pain, joint swelling and myalgias.  Skin: Negative.   Neurological: Negative.   Psychiatric/Behavioral: Negative.     Objective:  Physical Exam Constitutional:      Appearance: She is well-developed. She is obese.  HENT:     Head: Normocephalic and atraumatic.  Cardiovascular:     Rate and Rhythm: Normal rate and regular rhythm.  Pulmonary:     Effort: Pulmonary effort is normal. No respiratory distress.     Breath sounds: Normal breath sounds. No wheezing or rales.  Abdominal:     General: Bowel sounds are normal. There is no distension.     Palpations: Abdomen is soft.     Tenderness: There is no abdominal tenderness. There is no rebound.  Musculoskeletal:        General: Tenderness present.     Cervical back: Normal range of motion.     Comments: Tenderness lumbar, left thigh, left knee (more LCL and MCL region). ACL intact left knee  Skin:    General: Skin is warm and dry.  Neurological:     Mental Status: She is alert and oriented to person, place, and time.     Coordination: Coordination normal.    Vitals:   06/03/21 0927  BP: 126/88  Pulse: 76  Resp: 18  SpO2: 98%  Weight: 231 lb 3.2 oz (104.9 kg)  Height: 5\' 3"  (1.6 m)    This visit occurred during the SARS-CoV-2 public health emergency.  Safety protocols were in place,  including screening questions prior to the visit, additional usage of staff PPE, and extensive cleaning of exam room while observing appropriate contact time as indicated for disinfecting solutions.   Assessment & Plan:

## 2021-06-03 NOTE — Assessment & Plan Note (Signed)
S/P fall and still present about a month later. Checking x-ray lumbar. Rx meloxicam to take for pain. Avoid heels.

## 2021-06-06 ENCOUNTER — Encounter: Payer: Self-pay | Admitting: Internal Medicine

## 2021-06-06 DIAGNOSIS — M25562 Pain in left knee: Secondary | ICD-10-CM

## 2021-06-07 ENCOUNTER — Ambulatory Visit: Payer: 59 | Admitting: Family Medicine

## 2021-06-07 ENCOUNTER — Encounter: Payer: Self-pay | Admitting: Family Medicine

## 2021-06-07 ENCOUNTER — Ambulatory Visit: Payer: Self-pay

## 2021-06-07 ENCOUNTER — Other Ambulatory Visit: Payer: Self-pay

## 2021-06-07 VITALS — BP 104/70 | HR 83 | Ht 63.0 in | Wt 231.8 lb

## 2021-06-07 DIAGNOSIS — Z9884 Bariatric surgery status: Secondary | ICD-10-CM | POA: Diagnosis not present

## 2021-06-07 DIAGNOSIS — M25552 Pain in left hip: Secondary | ICD-10-CM | POA: Diagnosis not present

## 2021-06-07 DIAGNOSIS — M25562 Pain in left knee: Secondary | ICD-10-CM | POA: Diagnosis not present

## 2021-06-07 MED ORDER — CELECOXIB 100 MG PO CAPS: 100.0000 mg | ORAL_CAPSULE | Freq: Two times a day (BID) | ORAL | 1 refills | Status: DC | PRN

## 2021-06-07 NOTE — Progress Notes (Signed)
Subjective:    CC: L hip and L knee pain  I, Alicia Chen, LAT, ATC, am serving as scribe for Dr. [name].  HPI: Pt is a 55 y/o female presenting w/ c/o L hip, L knee and low back pain after suffering a fall approximately one month ago when she fell while at the hair salon.  She landed mainly on her L side and is still having L knee and L hip pain.  She was seen by her PCP for these c/o on 06/03/21 and noted 50% improvement in her symptoms and was prescribed Meloxicam.  Today, pt reports con't L knee, lateral thigh and lateral hip pain.  L hip pain: She locates her pain to her L lateral hip and thigh -Radiating pain: yes, into her L lateral thigh -Mechanical symptoms: No -Aggravating factors: walking/weight-bearing activity; when she wears certain shoes; transitioning from sitting to standing -Treatments tried: Meloxicam; Voltaren gel;   L knee pain: Locates pain to her L ant and post knee -Swelling: possibly -Mechanical symptoms: yes -Aggravating factors: walking/weight-bearing activity; transitioning from sitting to standing -Treatments tried: Meloxicam; Voltaren gel  Diagnostic testing: L-spine, L hip and L knee XR- 06/03/21   Pertinent review of Systems: No fevers or chills  Relevant historical information: Status post gastric sleeve   Objective:    Vitals:   06/07/21 1330  BP: 104/70  Pulse: 83  SpO2: 97%   General: Well Developed, well nourished, and in no acute distress.   MSK: Left hip normal-appearing Normal motion. Mildly tender palpation greater trochanter. Hip abduction strength diminished 4/5 with some pain.  Left knee mild effusion. Normal motion with crepitation. Mildly tender palpation anterior knee and lateral joint line. Stable ligamentous exam. Intact strength.   Lab and Radiology Results  EXAM: LUMBAR SPINE - COMPLETE 4+ VIEW   COMPARISON:  None.   FINDINGS: Subtle curvature of the lumbar spine convex left. Vertebral  body heights are maintained. Mild spondylosis throughout the lumbar spine to include facet arthropathy. Subtle disc space narrowing at the L4-5 level. No acute compression fracture or spondylolisthesis/spondylolysis.   IMPRESSION: 1. No acute findings. 2. Mild spondylosis of the lumbar spine with mild disc disease at the L4-5 level.     Electronically Signed   By: Marin Olp M.D.   On: 06/05/2021 12:28   EXAM: DG HIP (WITH OR WITHOUT PELVIS) 2-3V LEFT   COMPARISON:  None.   FINDINGS: Mild symmetric degenerative changes of the hips. No evidence of acute fracture or dislocation. Single surgical clip over the left pelvis. Degenerative change of the spine.   IMPRESSION: 1. No acute findings. 2. Mild symmetric degenerative change of the hips.     Electronically Signed   By: Marin Olp M.D.   On: 06/05/2021 12:26    EXAM: LEFT KNEE - COMPLETE 4+ VIEW   COMPARISON:  None.   FINDINGS: No evidence of acute fracture or dislocation. No significant joint effusion. Possible 3 mm loose body in the region of the intercondylar notch. Remainder of the exam is unremarkable.   IMPRESSION: 1. No acute findings. 2. Possible 3 mm loose body in the region of the intercondylar notch.     Electronically Signed   By: Marin Olp M.D.   On: 06/05/2021 12:25   I, [name], personally (independently) visualized and performed the interpretation of the images attached in this note.  Impression and Recommendations:    Assessment and Plan: 55 y.o. female with left lateral hip pain and  left knee pain after a fall occurring about a month ago.  Left knee pain due to exacerbation of DJD.  Its possible that she does have a more significant injury not seen on x-ray such as a meniscus injury or even an osteochondral injury.  Discussed options.  Plan for trial of physical therapy first and if not better then either proceed to trial of intra-articular steroid injection or potentially  MRI.  Check back in about 6 weeks.  Left lateral hip pain after fall due to greater trochanteric bursitis.  Again she is a good candidate for physical therapy.  Plan to refer to PT and check back in about 6 weeks.  NSAIDs: Discussed NSAID safety.  Patient has a history of a gastric sleeve and was prescribed meloxicam.  I think it is okay to take NSAIDs occasionally but in general she should try away from them if possible.  Recommend using topical Voltaren gel and Tylenol as first-line and if needed we will try Celebrex as it may be a little safer for her than meloxicam.  Celebrex prescribed but spent some time discussing NSAID safety.Marland Kitchen  PDMP not reviewed this encounter. Orders Placed This Encounter  Procedures   Ambulatory referral to Physical Therapy    Referral Priority:   Routine    Referral Type:   Physical Medicine    Referral Reason:   Specialty Services Required    Requested Specialty:   Physical Therapy    Number of Visits Requested:   1   Meds ordered this encounter  Medications   celecoxib (CELEBREX) 100 MG capsule    Sig: Take 1 capsule (100 mg total) by mouth 2 (two) times daily as needed.    Dispense:  60 capsule    Refill:  1    Discussed warning signs or symptoms. Please see discharge instructions. Patient expresses understanding.   The above documentation has been reviewed and is accurate and complete [name], M.D.

## 2021-06-07 NOTE — Patient Instructions (Addendum)
Nice to meet you today.  I've referred you to Physical Therapy.  Please let us know if you haven't heard from them in one week regarding scheduling.  Con't using Voltaren gel.  Follow-up: 6 weeks

## 2021-06-14 ENCOUNTER — Other Ambulatory Visit: Payer: Self-pay

## 2021-06-14 ENCOUNTER — Ambulatory Visit: Payer: 59 | Admitting: Physical Therapy

## 2021-06-14 DIAGNOSIS — R2689 Other abnormalities of gait and mobility: Secondary | ICD-10-CM | POA: Diagnosis not present

## 2021-06-14 DIAGNOSIS — R262 Difficulty in walking, not elsewhere classified: Secondary | ICD-10-CM

## 2021-06-14 DIAGNOSIS — M6281 Muscle weakness (generalized): Secondary | ICD-10-CM | POA: Diagnosis not present

## 2021-06-14 DIAGNOSIS — M25662 Stiffness of left knee, not elsewhere classified: Secondary | ICD-10-CM | POA: Diagnosis not present

## 2021-06-14 DIAGNOSIS — M25562 Pain in left knee: Secondary | ICD-10-CM

## 2021-06-14 NOTE — Patient Instructions (Signed)
Access Code: [email or web address] URL: [email or web address] Date: 06/14/2021 Prepared by: Estill Bamberg April Thurnell Garbe  Exercises Clamshell - 1 x daily - 7 x weekly - 2 sets - 10 reps Small Range Straight Leg Raise - 1 x daily - 7 x weekly - 2 sets - 10 reps Prone Quadriceps Stretch with Strap - 1 x daily - 7 x weekly - 2 sets - 30 sec hold Supine Bridge - 1 x daily - 7 x weekly - 2 sets - 10 reps

## 2021-06-15 NOTE — Therapy (Signed)
[name] [address] [place], [place], [address] Phone: [phone]   Fax:  [phone]  Physical Therapy Evaluation  Patient Details  Name: Alicia Chen MRN: [id number] Date of Birth: [birth year] Referring Provider (PT): Gregor Hams, MD   Encounter Date: 06/14/2021   PT End of Session - 06/14/21 2248     Visit Number 1    Number of Visits 12    Date for PT Re-Evaluation 07/26/21    Authorization Type UHC    PT Start Time 1520    PT Stop Time 1600    PT Time Calculation (min) 40 min    Activity Tolerance Patient tolerated treatment well    Behavior During Therapy [facility] for tasks assessed/performed             Past Medical History:  Diagnosis Date   ALLERGIC RHINITIS    ANEMIA-NOS    ARTHRITIS    ASTHMA    DEPRESSION    Fibroid    GERD    MIGRAINE HEADACHE    OBESITY, MORBID    Vitamin D deficiency     Past Surgical History:  Procedure Laterality Date   CHOLECYSTECTOMY  2009   ESOPHAGOGASTRODUODENOSCOPY N/A 12/23/2012   Procedure: ESOPHAGOGASTRODUODENOSCOPY (EGD);  Surgeon: Juanita Craver, MD;  Location: [facility];  Service: Endoscopy;  Laterality: N/A;   INTRAUTERINE DEVICE (IUD) INSERTION  12-15-13   Mirena   LAPAROSCOPIC GASTRIC SLEEVE RESECTION  2/17   ROOT CANAL     Skin graft surgery (R) leg     childhood    There were no vitals filed for this visit.    Subjective Assessment - 06/14/21 1438     Subjective Pt reports she fell a month ago and she's still hurting in her L knee and into the hip. Pt was in the hair salon, got hung up and fell forward straight on to the knee. Pt notes that pain wraps around the knee can sometimes go to her foot as well. Pt states it had gotten better but she started to exercise, walk, and wear heels and boots and the pain came back again.    Limitations Standing;Walking;Lifting    How long can you sit comfortably? Stiffens if sitting too long    How long can you stand comfortably?  ~1 hr    How long can you walk comfortably? Notices it more when she's done walking not during; can tell on the treadmill    Currently in Pain? Yes    Pain Score 2    At worst 6/10 after seeing sports med   Pain Location Knee    Pain Orientation Left    Pain Descriptors / Indicators Aching;Throbbing    Pain Type Acute pain    Pain Radiating Towards hip and foot    Aggravating Factors  Increased walking, wearing heels    Pain Relieving Factors Stretching, voltaren gel, pain meds                [facility] PT Assessment - 06/15/21 0001       Assessment   Medical Diagnosis M25.552 (ICD-10-CM) - Left hip pain  M25.562 (ICD-10-CM) - Acute pain of left knee    Referring Provider (PT) Gregor Hams, MD    Onset Date/Surgical Date --   ~a month ago   Prior Therapy Shoulder      Precautions   Precautions None      Restrictions   Weight Bearing Restrictions No  Balance Screen   Has the patient fallen in the past 6 months Yes    How many times? 1    Has the patient had a decrease in activity level because of a fear of falling?  No    Is the patient reluctant to leave their home because of a fear of falling?  No      Home Environment   Living Environment Private residence    Living Arrangements Spouse/significant other    Type of [place] Access Level entry    Additional Comments "pressure going up the steps"      Prior Function   Vocation Full time employment    Vocation Requirements Desk job      Observation/Other Assessments   Focus on Therapeutic Outcomes (FOTO)  50 (Risk adjusted 48); predicted 62      Functional Tests   Functional tests Single leg stance      Single Leg Stance   Comments able to maintain 30 sec bilat but with increased instability on L and minor LOBs      Strength   Left Hip Flexion 3+/5    Left Hip Extension 3+/5    Left Hip ABduction 3+/5    Left Hip ADduction 4/5    Right Knee Flexion 4/5    Right Knee Extension 4+/5    Left  Knee Flexion 4/5    Left Knee Extension 4-/5   Limited due to pain     Flexibility   Soft Tissue Assessment /Muscle Length yes    Quadriceps L tighter than R      Palpation   Palpation comment TTP quadriceps tendon      Special Tests    Special Tests Knee Special Tests    Knee Special tests  McConnell Test;Step-up/Step Down Test;Patellofemoral Apprehension Test;Patellofemoral Grind Test (Clarke's Sign)      McConnell Test   Findings Positive    Side Left      Patellofemoral Apprehension Test    Findings Negative      Patellofemoral Grind test (Clark's Sign)   Findings Negative    Side  Left                    Vestibular Assessment - 06/15/21 0001       Symptom Behavior   Subjective history of current problem Reports this has been an ongoing issue for the last couple of years. Usually dizziness resolves within a few seconds. Notes history of motion sensitivity (i.e. getting on/off airplane).    Type of Dizziness  Spinning;"World moves"    Frequency of Dizziness Laying down    Duration of Dizziness a few seconds    Symptom Nature Positional;Motion provoked    Aggravating Factors Lying supine;Rolling to right;Rolling to left;Supine to sit    Relieving Factors Head stationary;Closing eyes    Progression of Symptoms No change since onset                Objective measurements completed on examination: See above findings.                PT Education - 06/14/21 1451     Education Details Exam findings, POC, and HEP.    Person(s) Educated Patient    Methods Explanation;Demonstration;Tactile cues;Verbal cues;Handout    Comprehension Verbalized understanding;Returned demonstration;Verbal cues required;Tactile cues required                 PT Long Term Goals - 06/14/21  2255       PT LONG TERM GOAL #1   Title Pt will be independent with HEP    Time 6    Period Weeks    Status New    Target Date 07/26/21      PT LONG TERM GOAL #2    Title Pt will report resolution of her knee pain    Time 6    Period Weeks    Status New    Target Date 07/26/21      PT LONG TERM GOAL #3   Title Pt will improve FOTO score to 62    Baseline 50    Time 6    Period Weeks    Status New    Target Date 07/26/21      PT LONG TERM GOAL #4   Title Pt will be able to demo at least 4+/5 knee and hip strength    Time 6    Period Weeks    Status New    Target Date 07/26/21                    Plan - 06/14/21 2249     Clinical Impression Statement Ms. Tyquisha Sharps is a 55 y/o F presenting to OPPT s/p fall on her L knee. On assessment, pt's L knee remains TTP along patellar tendon, pes anserinus, and tibiofemoral joint with weak and tight quad and hips L worse than R and L knee instability. Pt also notes ongoing dizziness when laying down that lasts a few seconds -- this may benefit from further PT assessment for possible BPPV.  Pt would benefit from PT to address these issues to improve on her pain and return to PLOF.    Personal Factors and Comorbidities Age;Past/Current Experience;Fitness    Examination-Activity Limitations Squat;Stand;Locomotion Level    Examination-Participation Restrictions Community Activity;Shop;Yard Work    Stability/Clinical Decision Making Stable/Uncomplicated    Clinical Decision Making Low    Rehab Potential Good    PT Frequency 1x / week    PT Duration 6 weeks    PT Treatment/Interventions ADLs/Self Care Home Management;Cryotherapy;Electrical Stimulation;Iontophoresis 4mg /ml Dexamethasone;Moist Heat;Ultrasound;Gait training;Stair training;Functional mobility training;Therapeutic activities;Therapeutic exercise;Balance training;Neuromuscular re-education;Patient/family education;Orthotic Fit/Training;Manual techniques;Passive range of motion;Dry needling;Vasopneumatic Device;Taping;Vestibular;Canalith Repostioning    PT Next Visit Plan Progress knee and hip strengthening as able. Work on L LE stability.  BPPV assessment if able.    PT Home Exercise Plan Access Code: [email or web address]    Consulted and Agree with Plan of Care Patient             Patient will benefit from skilled therapeutic intervention in order to improve the following deficits and impairments:  Decreased range of motion, Decreased strength, Increased fascial restricitons, Pain, Improper body mechanics, Decreased balance, Decreased mobility, Increased edema, Postural dysfunction, Dizziness, Difficulty walking  Visit Diagnosis: Acute pain of left knee  Stiffness of left knee, not elsewhere classified  Muscle weakness (generalized)  Other abnormalities of gait and mobility  Difficulty in walking, not elsewhere classified     Problem List Patient Active Problem List   Diagnosis Date Noted   Acute pain of left knee 06/03/2021   Acute bilateral low back pain with left-sided sciatica 06/03/2021   Left hip pain 06/03/2021   Right flank pain 11/18/2020   Chronic constipation 11/18/2020   Anxiety 02/11/2019   Rash 11/07/2017   Elevated LFTs 12/04/2016   Routine general medical examination at a health care facility 07/11/2016   S/P laparoscopic sleeve  gastrectomy 10/14/2015   Multiple gastric polyps 08/18/2015   Trigger thumb of left hand 04/05/2015   OSA (obstructive sleep apnea) 02/05/2013   Morbid obesity ([place]) 09/26/2010   Sinusitis 08/15/2010   MIGRAINE HEADACHE 02/09/2010   ALLERGIC RHINITIS 02/09/2010   Asthma 02/09/2010   GERD 02/09/2010   Arthritis 02/09/2010    [facility] [address] [place], DPT 06/15/2021, 8:00 AM  [facility] [address] [place], [place], [address] Phone: [phone]   Fax:  [phone]  Name: NEREYDA BOWLER MRN: [id number] Date of Birth: 1965/08/31

## 2021-06-20 ENCOUNTER — Ambulatory Visit (INDEPENDENT_AMBULATORY_CARE_PROVIDER_SITE_OTHER): Payer: 59 | Admitting: Internal Medicine

## 2021-06-20 ENCOUNTER — Encounter: Payer: Self-pay | Admitting: Internal Medicine

## 2021-06-20 ENCOUNTER — Other Ambulatory Visit: Payer: Self-pay

## 2021-06-20 VITALS — BP 122/82 | HR 70 | Resp 18 | Ht 63.0 in | Wt 232.0 lb

## 2021-06-20 DIAGNOSIS — Z0000 Encounter for general adult medical examination without abnormal findings: Secondary | ICD-10-CM | POA: Diagnosis not present

## 2021-06-20 DIAGNOSIS — R21 Rash and other nonspecific skin eruption: Secondary | ICD-10-CM

## 2021-06-20 DIAGNOSIS — F419 Anxiety disorder, unspecified: Secondary | ICD-10-CM

## 2021-06-20 DIAGNOSIS — R7989 Other specified abnormal findings of blood chemistry: Secondary | ICD-10-CM | POA: Diagnosis not present

## 2021-06-20 DIAGNOSIS — J4520 Mild intermittent asthma, uncomplicated: Secondary | ICD-10-CM | POA: Diagnosis not present

## 2021-06-20 LAB — COMPREHENSIVE METABOLIC PANEL
ALT: 11 U/L (ref 0–35)
AST: 18 U/L (ref 0–37)
Albumin: 4.1 g/dL (ref 3.5–5.2)
Alkaline Phosphatase: 147 U/L — ABNORMAL HIGH (ref 39–117)
BUN: 14 mg/dL (ref 6–23)
CO2: 31 mEq/L (ref 19–32)
Calcium: 9.3 mg/dL (ref 8.4–10.5)
Chloride: 105 mEq/L (ref 96–112)
Creatinine, Ser: 1.11 mg/dL (ref 0.40–1.20)
GFR: 56.07 mL/min — ABNORMAL LOW (ref >60.00)
Glucose, Bld: 84 mg/dL (ref 70–99)
Potassium: 4.7 mEq/L (ref 3.5–5.1)
Sodium: 141 mEq/L (ref 135–145)
Total Bilirubin: 0.4 mg/dL (ref 0.2–1.2)
Total Protein: 7.2 g/dL (ref 6.0–8.3)

## 2021-06-20 LAB — CBC
HCT: 40.3 % (ref 36.0–46.0)
Hemoglobin: 12.7 g/dL (ref 12.0–15.0)
MCHC: 31.5 g/dL (ref 30.0–36.0)
MCV: 81.8 fl (ref 78.0–100.0)
Platelets: 313 10*3/uL (ref 150.0–400.0)
RBC: 4.92 Mil/uL (ref 3.87–5.11)
RDW: 15 % (ref 11.5–15.5)
WBC: 5.8 10*3/uL (ref 4.0–10.5)

## 2021-06-20 LAB — LIPID PANEL
Cholesterol: 243 mg/dL — ABNORMAL HIGH (ref 0–200)
HDL: 83.5 mg/dL (ref >39.00)
LDL Cholesterol: 140 mg/dL — ABNORMAL HIGH (ref 0–99)
NonHDL: 159.64
Total CHOL/HDL Ratio: 3
Triglycerides: 99 mg/dL (ref 0.0–149.0)
VLDL: 19.8 mg/dL (ref 0.0–40.0)

## 2021-06-20 LAB — HEMOGLOBIN A1C: Hgb A1c MFr Bld: 5.7 % (ref 4.6–6.5)

## 2021-06-20 MED ORDER — NYSTATIN 100000 UNIT/GM EX OINT: TOPICAL_OINTMENT | Freq: Two times a day (BID) | CUTANEOUS | 3 refills | Status: DC

## 2021-06-20 NOTE — Patient Instructions (Signed)
We have refilled the cream for you.  Make sure to get the covid-19 booster shot.

## 2021-06-22 NOTE — Assessment & Plan Note (Signed)
Refill nystatin ointment to use on the fungal rash on chest wall.

## 2021-06-22 NOTE — Assessment & Plan Note (Signed)
Taking celexa 30 mg daily and control is okay. She does not desire change today.

## 2021-06-22 NOTE — Progress Notes (Signed)
   Subjective:   Patient ID: Alicia Chen, female    DOB: 1966-06-16, 55 y.o.   MRN: [id number]  HPI The patient is a 55 YO female coming in for physical.   PMH, [place], social history reviewed and updated  Review of Systems  Constitutional: Negative.   HENT: Negative.    Eyes: Negative.   Respiratory:  Negative for cough, chest tightness and shortness of breath.   Cardiovascular:  Negative for chest pain, palpitations and leg swelling.  Gastrointestinal:  Negative for abdominal distention, abdominal pain, constipation, diarrhea, nausea and vomiting.  Musculoskeletal:  Positive for arthralgias.  Skin: Negative.   Neurological: Negative.   Psychiatric/Behavioral: Negative.     Objective:  Physical Exam Constitutional:      Appearance: She is well-developed.  HENT:     Head: Normocephalic and atraumatic.  Cardiovascular:     Rate and Rhythm: Normal rate and regular rhythm.  Pulmonary:     Effort: Pulmonary effort is normal. No respiratory distress.     Breath sounds: Normal breath sounds. No wheezing or rales.     Comments: Rash on chest wall Abdominal:     General: Bowel sounds are normal. There is no distension.     Palpations: Abdomen is soft.     Tenderness: There is no abdominal tenderness. There is no rebound.  Musculoskeletal:     Cervical back: Normal range of motion.  Skin:    General: Skin is warm and dry.  Neurological:     Mental Status: She is alert and oriented to person, place, and time.     Coordination: Coordination normal.    Vitals:   06/20/21 0809  BP: 122/82  Pulse: 70  Resp: 18  SpO2: 98%  Weight: 232 lb (105.2 kg)  Height: 5\' 3"  (1.6 m)    This visit occurred during the SARS-CoV-2 public health emergency.  Safety protocols were in place, including screening questions prior to the visit, additional usage of staff PPE, and extensive cleaning of exam room while observing appropriate contact time as indicated for disinfecting solutions.    Assessment & Plan:

## 2021-06-22 NOTE — Assessment & Plan Note (Signed)
Uses prn symbicort or albuterol and has not needed lately. No flare.

## 2021-06-22 NOTE — Assessment & Plan Note (Signed)
Counseled about diet and exercise to help.

## 2021-06-22 NOTE — Assessment & Plan Note (Signed)
Flu shot up to date. Covid-19 booster recommended. Pneumonia up to date until 20. Shingrix complete. Tetanus up to date. Colonoscopy up to date in care everywhere. Mammogram up to date, pap smear up to date. Counseled about sun safety and mole surveillance. Counseled about the dangers of distracted driving. Given 10 year screening recommendations.

## 2021-06-22 NOTE — Assessment & Plan Note (Signed)
Checking CMP for stability.  

## 2021-06-23 ENCOUNTER — Encounter: Payer: Self-pay | Admitting: Internal Medicine

## 2021-06-24 ENCOUNTER — Ambulatory Visit: Payer: 59 | Admitting: Rehabilitative and Restorative Service Providers"

## 2021-06-24 ENCOUNTER — Other Ambulatory Visit: Payer: Self-pay

## 2021-06-24 ENCOUNTER — Encounter: Payer: Self-pay | Admitting: Rehabilitative and Restorative Service Providers"

## 2021-06-24 DIAGNOSIS — R262 Difficulty in walking, not elsewhere classified: Secondary | ICD-10-CM

## 2021-06-24 DIAGNOSIS — M6281 Muscle weakness (generalized): Secondary | ICD-10-CM | POA: Diagnosis not present

## 2021-06-24 DIAGNOSIS — M25562 Pain in left knee: Secondary | ICD-10-CM | POA: Diagnosis not present

## 2021-06-24 DIAGNOSIS — M25662 Stiffness of left knee, not elsewhere classified: Secondary | ICD-10-CM | POA: Diagnosis not present

## 2021-06-24 NOTE — Therapy (Signed)
[facility] [address] [place], [place], [address] Phone: [phone]   Fax:  [phone]  Physical Therapy Treatment  Patient Details  Name: Alicia Chen MRN: [id number] Date of Birth: Jun 29, 1966 Referring Provider (PT): Gregor Hams, MD   Encounter Date: 06/24/2021   PT End of Session - 06/24/21 1216     Visit Number 2    Number of Visits 12    Date for PT Re-Evaluation 07/26/21    Authorization Type UHC    PT Start Time 0847    PT Stop Time 0930    PT Time Calculation (min) 43 min    Activity Tolerance Patient tolerated treatment well;No increased pain    Behavior During Therapy WFL for tasks assessed/performed             Past Medical History:  Diagnosis Date   ALLERGIC RHINITIS    ANEMIA-NOS    ARTHRITIS    ASTHMA    DEPRESSION    Fibroid    GERD    MIGRAINE HEADACHE    OBESITY, MORBID    Vitamin D deficiency     Past Surgical History:  Procedure Laterality Date   CHOLECYSTECTOMY  2009   ESOPHAGOGASTRODUODENOSCOPY N/A 12/23/2012   Procedure: ESOPHAGOGASTRODUODENOSCOPY (EGD);  Surgeon: Juanita Craver, MD;  Location: [facility];  Service: Endoscopy;  Laterality: N/A;   INTRAUTERINE DEVICE (IUD) INSERTION  12-15-13   Mirena   LAPAROSCOPIC GASTRIC SLEEVE RESECTION  2/17   ROOT CANAL     Skin graft surgery (R) leg     childhood    There were no vitals filed for this visit.   Subjective Assessment - 06/24/21 0857     Subjective Lateshia notes progress with her L knee and hip.  Her R knee has started to bother her.    Limitations Standing;Walking;Lifting    How long can you sit comfortably? Stiffens if sitting too long    How long can you stand comfortably? ~1 hr    How long can you walk comfortably? Notices it more when she's done walking not during; can tell on the treadmill    Currently in Pain? Yes    Pain Score 3     Pain Location Knee    Pain Orientation Left    Pain Descriptors / Indicators Aching;Sore     Pain Type Acute pain    Pain Radiating Towards R & L sided pain (overuse on the R side with compensating)    Pain Onset More than a month ago    Pain Frequency Intermittent    Aggravating Factors  Prolonged sitting, prolonged WB    Pain Relieving Factors Voltaren gel    Effect of Pain on Daily Activities Not walking for exercise, affects sit to stand    Multiple Pain Sites No                               [facility] Adult PT Treatment/Exercise - 06/24/21 0001       Exercises   Exercises Knee/Hip      Knee/Hip Exercises: Stretches   Quad Stretch Both;2 reps;20 seconds;Limitations    Quad Stretch Limitations Prone with belt and standing with foot in chair 2X each      Knee/Hip Exercises: Seated   Long Arc Quad Strengthening;Both;3 sets;5 reps;Limitations    Long Arc Quad Limitations Seated straight leg raises    Other Seated Knee/Hip Exercises Tailgate knee flexion 1 minute  B    Sit to Sand 5 reps;without UE support      Knee/Hip Exercises: Supine   Bridges Strengthening;Both;2 sets;10 reps;Limitations    Bridges Limitations 5 seconds      Knee/Hip Exercises: Sidelying   Clams 2 sets of 10 B                     PT Education - 06/24/21 1215     Education Details Reviewed day 1 HEP with change of SLR from supine to seated to emphasize quadriceps activation.  Recommendations to do tailgate knee flexion with prolonged sitting to avoid stiffness when unable to stand.    Person(s) Educated Patient    Methods Explanation;Handout;Demonstration;Tactile cues;Verbal cues    Comprehension Verbal cues required;Returned demonstration;Need further instruction;Tactile cues required;Verbalized understanding                 PT Long Term Goals - 06/14/21 2255       PT LONG TERM GOAL #1   Title Pt will be independent with HEP    Time 6    Period Weeks    Status New    Target Date 07/26/21      PT LONG TERM GOAL #2   Title Pt will report resolution of  her knee pain    Time 6    Period Weeks    Status New    Target Date 07/26/21      PT LONG TERM GOAL #3   Title Pt will improve FOTO score to 62    Baseline 50    Time 6    Period Weeks    Status New    Target Date 07/26/21      PT LONG TERM GOAL #4   Title Pt will be able to demo at least 4+/5 knee and hip strength    Time 6    Period Weeks    Status New    Target Date 07/26/21                   Plan - 06/24/21 1217     Clinical Impression Statement Alicia Chen reports good early compliance with her HEP.  She is already noticing improvements with her L hip and knee.  She may be getting some overuse on the R side affecting her R knee.  I recommended Iyannah do exercises bilaterally to address this (particularly seated straight leg raises).  Continue quadriceps, hip abductors and general strength work to meet LTGs.    Personal Factors and Comorbidities Age;Past/Current Experience;Fitness    Examination-Activity Limitations Squat;Stand;Locomotion Level    Examination-Participation Restrictions Community Activity;Shop;Yard Work    Stability/Clinical Decision Making Stable/Uncomplicated    Rehab Potential Good    PT Frequency 1x / week    PT Duration 6 weeks    PT Treatment/Interventions ADLs/Self Care Home Management;Cryotherapy;Electrical Stimulation;Iontophoresis 4mg /ml Dexamethasone;Moist Heat;Ultrasound;Gait training;Stair training;Functional mobility training;Therapeutic activities;Therapeutic exercise;Balance training;Neuromuscular re-education;Patient/family education;Orthotic Fit/Training;Manual techniques;Passive range of motion;Dry needling;Vasopneumatic Device;Taping;Vestibular;Canalith Repostioning    PT Next Visit Plan Progress B LE strength.  Flexibility and AROM as needed.  General conditioning.    PT Home Exercise Plan Access Code: [email or web address]    Consulted and Agree with Plan of Care Patient             Patient will benefit from skilled therapeutic intervention in  order to improve the following deficits and impairments:  Decreased range of motion, Decreased strength, Increased fascial restricitons, Pain, Improper body mechanics, Decreased balance, Decreased mobility, Increased  edema, Postural dysfunction, Dizziness, Difficulty walking  Visit Diagnosis: Difficulty in walking, not elsewhere classified  Stiffness of left knee, not elsewhere classified  Acute pain of left knee  Muscle weakness (generalized)     Problem List Patient Active Problem List   Diagnosis Date Noted   Acute pain of left knee 06/03/2021   Acute bilateral low back pain with left-sided sciatica 06/03/2021   Left hip pain 06/03/2021   Chronic constipation 11/18/2020   Anxiety 02/11/2019   Rash 11/07/2017   Elevated LFTs 12/04/2016   Routine general medical examination at a health care facility 07/11/2016   S/P laparoscopic sleeve gastrectomy 10/14/2015   Multiple gastric polyps 08/18/2015   Trigger thumb of left hand 04/05/2015   OSA (obstructive sleep apnea) 02/05/2013   Morbid obesity ([place]) 09/26/2010   MIGRAINE HEADACHE 02/09/2010   ALLERGIC RHINITIS 02/09/2010   Asthma 02/09/2010   GERD 02/09/2010   Arthritis 02/09/2010    Farley Ly, PT, MPT 06/24/2021, 12:20 PM  [facility] [address] [place], [place], [address] Phone: [phone]   Fax:  [phone]  Name: Alicia Chen MRN: [id number] Date of Birth: Mar 05, 1966

## 2021-06-24 NOTE — Patient Instructions (Addendum)
Access Code: [email or web address] URL: [email or web address] Date: 06/24/2021 Prepared by: Vista Mink  Exercises Clamshell - 1 x daily - 7 x weekly - 2 sets - 10 reps Prone Quadriceps Stretch with Strap - 1 x daily - 7 x weekly - 2 sets - 30 sec hold Supine Bridge - 1 x daily - 7 x weekly - 2 sets - 10 reps Seated Straight Leg Raise - 1-2 x daily - 7 x weekly - 3-5 sets - 5 reps - 3 seconds hold

## 2021-06-29 ENCOUNTER — Encounter: Payer: 59 | Admitting: Physical Therapy

## 2021-07-05 ENCOUNTER — Other Ambulatory Visit: Payer: Self-pay

## 2021-07-05 ENCOUNTER — Encounter: Payer: Self-pay | Admitting: Rehabilitative and Restorative Service Providers"

## 2021-07-05 ENCOUNTER — Ambulatory Visit: Payer: 59 | Admitting: Rehabilitative and Restorative Service Providers"

## 2021-07-05 DIAGNOSIS — R262 Difficulty in walking, not elsewhere classified: Secondary | ICD-10-CM | POA: Diagnosis not present

## 2021-07-05 DIAGNOSIS — M25662 Stiffness of left knee, not elsewhere classified: Secondary | ICD-10-CM | POA: Diagnosis not present

## 2021-07-05 DIAGNOSIS — M6281 Muscle weakness (generalized): Secondary | ICD-10-CM | POA: Diagnosis not present

## 2021-07-05 DIAGNOSIS — M25562 Pain in left knee: Secondary | ICD-10-CM | POA: Diagnosis not present

## 2021-07-05 NOTE — Therapy (Signed)
[facility] [address] [place], [place], [address] Phone: [phone]   Fax:  [phone]  Physical Therapy Treatment  Patient Details  Name: Alicia Chen MRN: [id number] Date of Birth: Dec 28, 1965 Referring Provider (PT): Gregor Hams, MD   Encounter Date: 07/05/2021   PT End of Session - 07/05/21 1648     Visit Number 3    Number of Visits 12    Date for PT Re-Evaluation 07/26/21    Authorization Type UHC    PT Start Time 1062    PT Stop Time 6948    PT Time Calculation (min) 40 min    Activity Tolerance Patient tolerated treatment well;No increased pain    Behavior During Therapy WFL for tasks assessed/performed             Past Medical History:  Diagnosis Date   ALLERGIC RHINITIS    ANEMIA-NOS    ARTHRITIS    ASTHMA    DEPRESSION    Fibroid    GERD    MIGRAINE HEADACHE    OBESITY, MORBID    Vitamin D deficiency     Past Surgical History:  Procedure Laterality Date   CHOLECYSTECTOMY  2009   ESOPHAGOGASTRODUODENOSCOPY N/A 12/23/2012   Procedure: ESOPHAGOGASTRODUODENOSCOPY (EGD);  Surgeon: Juanita Craver, MD;  Location: [facility];  Service: Endoscopy;  Laterality: N/A;   INTRAUTERINE DEVICE (IUD) INSERTION  12-15-13   Mirena   LAPAROSCOPIC GASTRIC SLEEVE RESECTION  2/17   ROOT CANAL     Skin graft surgery (R) leg     childhood    There were no vitals filed for this visit.   Subjective Assessment - 07/05/21 1612     Subjective Alicia Chen reports that because of a death in her family (brother) she has not had great HEP compliance.  The drive to and from [facility] was a challenge.    Limitations Standing;Walking;Lifting    How long can you sit comfortably? Stiffens if sitting too long    How long can you stand comfortably? ~1 hr    How long can you walk comfortably? Notices it more when she's done walking not during; can tell on the treadmill    Currently in Pain? Yes    Pain Score 4     Pain Location Knee    Pain  Orientation Right    Pain Descriptors / Indicators Aching;Sore    Pain Type Acute pain    Pain Radiating Towards R knee (overuse)    Pain Onset More than a month ago    Pain Frequency Intermittent    Aggravating Factors  Prolonged sitting and WB    Pain Relieving Factors Ice and Voltaren gel    Effect of Pain on Daily Activities Not walking for exercise, affects sit to stand particularly after prolonged sitting    Multiple Pain Sites No                               [facility] Adult PT Treatment/Exercise - 07/05/21 0001       Exercises   Exercises Knee/Hip      Knee/Hip Exercises: Stretches   Quad Stretch Both;2 reps;20 seconds;Limitations    Quad Stretch Limitations Standing with foot in chair 2X each      Knee/Hip Exercises: Aerobic   Recumbent Bike Seat 3 for 8 minutes Level 3      Knee/Hip Exercises: Machines for Strengthening   Cybex Knee Extension 20X Up  B, down single leg (alternating) 20# slow eccentrics 90-30 degrees (avoid full extension)    Total Gym Leg Press 150# double leg and 75# single leg each leg slow eccentrics 10X each      Knee/Hip Exercises: Seated   Long Arc Quad Strengthening;Both;3 sets;5 reps;Limitations    Long Arc Quad Limitations Seated straight leg raises    Other Seated Knee/Hip Exercises Tailgate knee flexion 1 minute B    Sit to [employer] 5 reps;without UE support      Knee/Hip Exercises: Supine   Bridges Strengthening;Both;2 sets;10 reps;Limitations    Bridges Limitations 5 seconds      Knee/Hip Exercises: Sidelying   Clams --                     PT Education - 07/05/21 1647     Education Details Reviewed HEP with time spent on activities she can do at the gym, focusing on technique.    Person(s) Educated Patient    Methods Explanation;Demonstration;Tactile cues;Verbal cues    Comprehension Verbal cues required;Tactile cues required;Verbalized understanding;Returned demonstration;Need further instruction                  PT Long Term Goals - 06/14/21 2255       PT LONG TERM GOAL #1   Title Pt will be independent with HEP    Time 6    Period Weeks    Status New    Target Date 07/26/21      PT LONG TERM GOAL #2   Title Pt will report resolution of her knee pain    Time 6    Period Weeks    Status New    Target Date 07/26/21      PT LONG TERM GOAL #3   Title Pt will improve FOTO score to 62    Baseline 50    Time 6    Period Weeks    Status New    Target Date 07/26/21      PT LONG TERM GOAL #4   Title Pt will be able to demo at least 4+/5 knee and hip strength    Time 6    Period Weeks    Status New    Target Date 07/26/21                   Plan - 07/05/21 1648     Clinical Impression Statement Alicia Chen reports poor HEP compliance due to a death in her family.  She did a good job with her technique today although she needed cues and correction for the best results (slow eccentrics and avoid hyperextension).  Continue quadriceps strengthening emphasis to address knee and hip pain.    Personal Factors and Comorbidities Age;Past/Current Experience;Fitness    Examination-Activity Limitations Squat;Stand;Locomotion Level    Examination-Participation Restrictions Community Activity;Shop;Yard Work    Stability/Clinical Decision Making Stable/Uncomplicated    Rehab Potential Good    PT Frequency 1x / week    PT Duration 6 weeks    PT Treatment/Interventions ADLs/Self Care Home Management;Cryotherapy;Electrical Stimulation;Iontophoresis 4mg /ml Dexamethasone;Moist Heat;Ultrasound;Gait training;Stair training;Functional mobility training;Therapeutic activities;Therapeutic exercise;Balance training;Neuromuscular re-education;Patient/family education;Orthotic Fit/Training;Manual techniques;Passive range of motion;Dry needling;Vasopneumatic Device;Taping;Vestibular;Canalith Repostioning    PT Next Visit Plan Progress B quadriceps and hip abductors strength.  Flexibility and AROM as  needed.  General conditioning.    PT Home Exercise Plan Access Code: [email or web address]    Consulted and Agree with Plan of Care Patient  Patient will benefit from skilled therapeutic intervention in order to improve the following deficits and impairments:  Decreased range of motion, Decreased strength, Increased fascial restricitons, Pain, Improper body mechanics, Decreased balance, Decreased mobility, Increased edema, Postural dysfunction, Dizziness, Difficulty walking  Visit Diagnosis: Difficulty in walking, not elsewhere classified  Stiffness of left knee, not elsewhere classified  Acute pain of left knee  Muscle weakness (generalized)     Problem List Patient Active Problem List   Diagnosis Date Noted   Acute pain of left knee 06/03/2021   Acute bilateral low back pain with left-sided sciatica 06/03/2021   Left hip pain 06/03/2021   Chronic constipation 11/18/2020   Anxiety 02/11/2019   Rash 11/07/2017   Elevated LFTs 12/04/2016   Routine general medical examination at a health care facility 07/11/2016   S/P laparoscopic sleeve gastrectomy 10/14/2015   Multiple gastric polyps 08/18/2015   Trigger thumb of left hand 04/05/2015   OSA (obstructive sleep apnea) 02/05/2013   Morbid obesity ([place]) 09/26/2010   MIGRAINE HEADACHE 02/09/2010   ALLERGIC RHINITIS 02/09/2010   Asthma 02/09/2010   GERD 02/09/2010   Arthritis 02/09/2010    Farley Ly, PT, MPT 07/05/2021, 4:51 PM  [facility] [address] [place], [place], [address] Phone: [phone]   Fax:  [phone]  Name: Alicia Chen MRN: [id number] Date of Birth: [birth year]

## 2021-07-05 NOTE — Patient Instructions (Signed)
Continue current HEP with emphasis on seated straight leg raises (avoiding hyperextension)

## 2021-07-13 ENCOUNTER — Ambulatory Visit: Payer: 59 | Admitting: Rehabilitative and Restorative Service Providers"

## 2021-07-13 ENCOUNTER — Other Ambulatory Visit: Payer: Self-pay

## 2021-07-13 ENCOUNTER — Encounter: Payer: Self-pay | Admitting: Rehabilitative and Restorative Service Providers"

## 2021-07-13 DIAGNOSIS — M6281 Muscle weakness (generalized): Secondary | ICD-10-CM | POA: Diagnosis not present

## 2021-07-13 DIAGNOSIS — R262 Difficulty in walking, not elsewhere classified: Secondary | ICD-10-CM | POA: Diagnosis not present

## 2021-07-13 DIAGNOSIS — M25562 Pain in left knee: Secondary | ICD-10-CM | POA: Diagnosis not present

## 2021-07-13 DIAGNOSIS — M25662 Stiffness of left knee, not elsewhere classified: Secondary | ICD-10-CM | POA: Diagnosis not present

## 2021-07-13 NOTE — Therapy (Signed)
[facility] [address] [place], [place], [address] Phone: [phone]   Fax:  [phone]  Physical Therapy Treatment  Patient Details  Name: Alicia Chen MRN: [id number] Date of Birth: June 23, 1966 Referring Provider (PT): Gregor Hams, MD   Encounter Date: 07/13/2021   PT End of Session - 07/13/21 1652     Visit Number 4    Number of Visits 12    Date for PT Re-Evaluation 07/26/21    Authorization Type UHC    PT Start Time 1600    PT Stop Time 1601    PT Time Calculation (min) 44 min    Activity Tolerance Patient tolerated treatment well;No increased pain    Behavior During Therapy WFL for tasks assessed/performed             Past Medical History:  Diagnosis Date   ALLERGIC RHINITIS    ANEMIA-NOS    ARTHRITIS    ASTHMA    DEPRESSION    Fibroid    GERD    MIGRAINE HEADACHE    OBESITY, MORBID    Vitamin D deficiency     Past Surgical History:  Procedure Laterality Date   CHOLECYSTECTOMY  2009   ESOPHAGOGASTRODUODENOSCOPY N/A 12/23/2012   Procedure: ESOPHAGOGASTRODUODENOSCOPY (EGD);  Surgeon: Juanita Craver, MD;  Location: [facility];  Service: Endoscopy;  Laterality: N/A;   INTRAUTERINE DEVICE (IUD) INSERTION  12-15-13   Mirena   LAPAROSCOPIC GASTRIC SLEEVE RESECTION  2/17   ROOT CANAL     Skin graft surgery (R) leg     childhood    There were no vitals filed for this visit.   Subjective Assessment - 07/13/21 1603     Subjective Alicia Chen reports better HEP compliance over the last week including 2X at the gym.  No pain meds needed this week.    Limitations Standing;Walking;Lifting    How long can you sit comfortably? Stiffens if sitting too long    How long can you stand comfortably? ~1 hr    How long can you walk comfortably? Notices it more when she's done walking not during; can tell on the treadmill    Currently in Pain? Yes    Pain Score 3     Pain Location Knee    Pain Orientation Right    Pain Descriptors /  Indicators Aching;Sore    Pain Type Acute pain    Pain Radiating Towards R knee overuse (3/10) and L knee (1/10)    Pain Onset More than a month ago    Pain Frequency Intermittent    Aggravating Factors  Prolonged sitting, sit to stand and WB    Pain Relieving Factors Has not needed ice or Voltaren over the past week    Effect of Pain on Daily Activities Sit to stand after prolonged sitting and stairs are limited    Multiple Pain Sites No                               [facility] Adult PT Treatment/Exercise - 07/13/21 0001       Therapeutic Activites    Therapeutic Activities ADL's    ADL's Sit to stand; leg press for stairs      Exercises   Exercises Knee/Hip      Knee/Hip Exercises: Stretches   Quad Stretch Both;2 reps;20 seconds;Limitations    Quad Stretch Limitations Standing with foot in chair 2X each      Knee/Hip Exercises:  Aerobic   Recumbent Bike Seat 3 for 8 minutes Level 3-5      Knee/Hip Exercises: Machines for Strengthening   Cybex Knee Extension 20X Up B, down single leg (alternating) 20# slow eccentrics 90-30 degrees (avoid full extension)    Total Gym Leg Press 150# double leg and 75# single leg each leg slow eccentrics 15X each      Knee/Hip Exercises: Seated   Long Arc Quad Strengthening;Both;3 sets;5 reps;Limitations    Long Arc Quad Limitations Seated straight leg raises    Other Seated Knee/Hip Exercises Tailgate knee flexion 1 minute B    Sit to [employer] 5 reps;without UE support      Knee/Hip Exercises: Supine   Bridges Strengthening;Both;2 sets;10 reps;Limitations    Bridges Limitations 5 seconds      Knee/Hip Exercises: Sidelying   Clams 2 sets of 10 B                     PT Education - 07/13/21 1651     Education Details Reviewed home and gym program with emphasis on functional movements (leg press and sit to stand) to help with stairs and discomfort with sit to stand.    Person(s) Educated Patient    Methods  Explanation;Demonstration;Tactile cues;Verbal cues    Comprehension Verbal cues required;Returned demonstration;Verbalized understanding;Need further instruction;Tactile cues required                 PT Long Term Goals - 06/14/21 2255       PT LONG TERM GOAL #1   Title Pt will be independent with HEP    Time 6    Period Weeks    Status New    Target Date 07/26/21      PT LONG TERM GOAL #2   Title Pt will report resolution of her knee pain    Time 6    Period Weeks    Status New    Target Date 07/26/21      PT LONG TERM GOAL #3   Title Pt will improve FOTO score to 62    Baseline 50    Time 6    Period Weeks    Status New    Target Date 07/26/21      PT LONG TERM GOAL #4   Title Pt will be able to demo at least 4+/5 knee and hip strength    Time 6    Period Weeks    Status New    Target Date 07/26/21                   Plan - 07/13/21 1653     Clinical Impression Statement Alicia Chen reports better HEP compliance this week.  Her knees are feeling better and she has been able to do more stairs before increasing B knee pain.  Quadriceps strength and function are the focus of her home and clinic program.  RA next visit to make additional recommendations.    Personal Factors and Comorbidities Age;Past/Current Experience;Fitness    Examination-Activity Limitations Squat;Stand;Locomotion Level    Examination-Participation Restrictions Community Activity;Shop;Yard Work    Stability/Clinical Decision Making Stable/Uncomplicated    Clinical Decision Making Low    Rehab Potential Good    PT Frequency 1x / week    PT Duration 6 weeks    PT Treatment/Interventions ADLs/Self Care Home Management;Cryotherapy;Electrical Stimulation;Iontophoresis 4mg /ml Dexamethasone;Moist Heat;Ultrasound;Gait training;Stair training;Functional mobility training;Therapeutic activities;Therapeutic exercise;Balance training;Neuromuscular re-education;Patient/family education;Orthotic  Fit/Training;Manual techniques;Passive range of motion;Dry needling;Vasopneumatic Device;Taping;Vestibular;Canalith Repostioning  PT Next Visit Plan Progress B quadriceps and hip abductors strength.  Flexibility and AROM as needed.  RA and FOTO next visit.    PT Home Exercise Plan Access Code: [email or web address]    Consulted and Agree with Plan of Care Patient             Patient will benefit from skilled therapeutic intervention in order to improve the following deficits and impairments:  Decreased range of motion, Decreased strength, Increased fascial restricitons, Pain, Improper body mechanics, Decreased balance, Decreased mobility, Increased edema, Postural dysfunction, Dizziness, Difficulty walking  Visit Diagnosis: Difficulty in walking, not elsewhere classified  Stiffness of left knee, not elsewhere classified  Acute pain of left knee  Muscle weakness (generalized)     Problem List Patient Active Problem List   Diagnosis Date Noted   Acute pain of left knee 06/03/2021   Acute bilateral low back pain with left-sided sciatica 06/03/2021   Left hip pain 06/03/2021   Chronic constipation 11/18/2020   Anxiety 02/11/2019   Rash 11/07/2017   Elevated LFTs 12/04/2016   Routine general medical examination at a health care facility 07/11/2016   S/P laparoscopic sleeve gastrectomy 10/14/2015   Multiple gastric polyps 08/18/2015   Trigger thumb of left hand 04/05/2015   OSA (obstructive sleep apnea) 02/05/2013   Morbid obesity ([place]) 09/26/2010   MIGRAINE HEADACHE 02/09/2010   ALLERGIC RHINITIS 02/09/2010   Asthma 02/09/2010   GERD 02/09/2010   Arthritis 02/09/2010    Farley Ly, PT, MPT 07/13/2021, 4:55 PM  [facility] [address] [place], [place], [address] Phone: [phone]   Fax:  [phone]  Name: Alicia Chen MRN: [id number] Date of Birth: 03-May-1966

## 2021-07-13 NOTE — Patient Instructions (Signed)
Access Code: [email or web address] URL: [email or web address] Date: 07/13/2021 Prepared by: Vista Mink  Exercises Clamshell - 1 x daily - 7 x weekly - 2 sets - 10 reps Prone Quadriceps Stretch with Strap - 1 x daily - 7 x weekly - 2 sets - 30 sec hold Supine Bridge - 1 x daily - 7 x weekly - 2 sets - 10 reps Small Range Straight Leg Raise - 1-2 x daily - 7 x weekly - 3-5 sets - 5 reps - 3 seconds hold

## 2021-07-19 ENCOUNTER — Ambulatory Visit: Payer: Self-pay

## 2021-07-19 ENCOUNTER — Ambulatory Visit (INDEPENDENT_AMBULATORY_CARE_PROVIDER_SITE_OTHER): Payer: 59

## 2021-07-19 ENCOUNTER — Ambulatory Visit: Payer: 59 | Admitting: Family Medicine

## 2021-07-19 ENCOUNTER — Other Ambulatory Visit: Payer: Self-pay

## 2021-07-19 VITALS — BP 122/82 | HR 77 | Ht 63.0 in | Wt 237.6 lb

## 2021-07-19 DIAGNOSIS — G8929 Other chronic pain: Secondary | ICD-10-CM

## 2021-07-19 DIAGNOSIS — M25561 Pain in right knee: Secondary | ICD-10-CM

## 2021-07-19 DIAGNOSIS — M25552 Pain in left hip: Secondary | ICD-10-CM

## 2021-07-19 NOTE — Progress Notes (Signed)
   I, [name], LAT, ATC acting as a scribe for [name], MD.  Alicia Chen is a 55 y.o. female who presents to [facility] at [facility] today for f/u L hip and L knee pain that she injured from a fall at a hair salon. Pt was last seen by Dr. Georgina Snell on 06/07/21 and was referred to PT, completing 4 visits. Today, pt reports L hip and L knee are much improved. Pt c/o R knee started hurting about 3 weeks ago after walking up 3 flights of stairs. Pt has been treating the R knee at PT and pain is improving.   Dx imaging: 06/03/21 L-spine, L hip, & L knee XR  Pertinent review of systems: No fevers or chills  Relevant historical information: History status post lap sleeve gastrectomy   Exam:  BP 122/82   Pulse 77   Ht 5\' 3"  (1.6 m)   Wt 237 lb 9.6 oz (107.8 kg)   LMP 01/18/2014   SpO2 98%   BMI 42.09 kg/m  General: Well Developed, well nourished, and in no acute distress.   MSK: Right knee normal-appearing Tender palpation medial joint line. Normal knee motion. Intact strength. Stable ligamentous exam.   Left knee normal-appearing normal motion intact strength.  L-spine nontender normal lumbar motion.  Left hip normal motion.    Lab and Radiology Results  X-ray images right knee obtained today personally and independently interpreted Mild medial and mild to moderate patellofemoral DJD.  No acute fractures. Await formal radiology review    Assessment and Plan: 55 y.o. female with right knee pain thought to be due to DJD exacerbation.  Patient had is already getting better as physical therapy is started to focus on her right knee as well.  Certainly steroid injection would be reasonable to proceed in the future if needed.  She would like to wait on it for now.  Her left knee left hip and low back pain addressed 6 weeks ago is already starting to improve with physical therapy significantly.  This is great news.  Recheck back as needed.   PDMP not  reviewed this encounter. Orders Placed This Encounter  Procedures   DG Knee AP/LAT W/Sunrise Right    Standing Status:   Future    Number of Occurrences:   1    Standing Expiration Date:   07/19/2022    Order Specific Question:   Reason for Exam (SYMPTOM  OR DIAGNOSIS REQUIRED)    Answer:   right knee pain    Order Specific Question:   Preferred imaging location?    Answer:   [facility]    Order Specific Question:   Is patient pregnant?    Answer:   No   No orders of the defined types were placed in this encounter.    Discussed warning signs or symptoms. Please see discharge instructions. Patient expresses understanding.   The above documentation has been reviewed and is accurate and complete [name], M.D.

## 2021-07-19 NOTE — Patient Instructions (Addendum)
Thank you for coming in today.   Please get an Xray today before you leave   Let me know if you knee pain worsens and we can do a steroid injection  Recheck back as needed

## 2021-07-20 ENCOUNTER — Ambulatory Visit: Payer: 59 | Admitting: Rehabilitative and Restorative Service Providers"

## 2021-07-20 ENCOUNTER — Encounter: Payer: Self-pay | Admitting: Rehabilitative and Restorative Service Providers"

## 2021-07-20 DIAGNOSIS — M25562 Pain in left knee: Secondary | ICD-10-CM

## 2021-07-20 DIAGNOSIS — M25662 Stiffness of left knee, not elsewhere classified: Secondary | ICD-10-CM | POA: Diagnosis not present

## 2021-07-20 DIAGNOSIS — R262 Difficulty in walking, not elsewhere classified: Secondary | ICD-10-CM

## 2021-07-20 DIAGNOSIS — M6281 Muscle weakness (generalized): Secondary | ICD-10-CM

## 2021-07-20 NOTE — Therapy (Addendum)
[facility] [address] [place], [place], [address] Phone: [phone]   Fax:  [phone]  Physical Therapy Treatment/Progress Note /Discharge  Patient Details  Name: Alicia Chen MRN: [id number] Date of Birth: 1966-03-29 Referring Provider (PT): Gregor Hams, MD  Progress Note Reporting Period 06/14/2021 to 07/20/2021  See note below for Objective Data and Assessment of Progress/Goals.     Encounter Date: 07/20/2021   PT End of Session - 07/20/21 1708     Visit Number 5    Number of Visits 12    Date for PT Re-Evaluation 07/26/21    Authorization Type UHC    PT Start Time 1600    PT Stop Time 0350    PT Time Calculation (min) 44 min    Activity Tolerance Patient tolerated treatment well;No increased pain    Behavior During Therapy WFL for tasks assessed/performed             Past Medical History:  Diagnosis Date   ALLERGIC RHINITIS    ANEMIA-NOS    ARTHRITIS    ASTHMA    DEPRESSION    Fibroid    GERD    MIGRAINE HEADACHE    OBESITY, MORBID    Vitamin D deficiency     Past Surgical History:  Procedure Laterality Date   CHOLECYSTECTOMY  2009   ESOPHAGOGASTRODUODENOSCOPY N/A 12/23/2012   Procedure: ESOPHAGOGASTRODUODENOSCOPY (EGD);  Surgeon: Juanita Craver, MD;  Location: [facility];  Service: Endoscopy;  Laterality: N/A;   INTRAUTERINE DEVICE (IUD) INSERTION  12-15-13   Mirena   LAPAROSCOPIC GASTRIC SLEEVE RESECTION  2/17   ROOT CANAL     Skin graft surgery (R) leg     childhood    There were no vitals filed for this visit.   Subjective Assessment - 07/20/21 1605     Subjective [place] continues good gym and HEP compliance.  Still no need for pain meds.    Limitations Standing;Walking;Lifting    How long can you sit comfortably? Stiffens if sitting too long    How long can you stand comfortably? ~1 hr    How long can you walk comfortably? Notices it more when she's done walking not during; can tell on the  treadmill    Currently in Pain? Yes    Pain Score 3     Pain Location Knee    Pain Orientation Right    Pain Descriptors / Indicators Aching;Sore    Pain Type Acute pain    Pain Radiating Towards R knee 2.5/10 and L knee 1/10    Pain Onset More than a month ago    Pain Frequency Intermittent    Aggravating Factors  Prolonged standing and WB increase discomfort although this is improving    Effect of Pain on Daily Activities Fatigues quickly and pain incraeses with increases in activity    Multiple Pain Sites No                [facility] PT Assessment - 07/20/21 0001       Observation/Other Assessments   Focus on Therapeutic Outcomes (FOTO)  60 (was 48 on visit 5, Goal 62 in 10 visits)      ROM / Strength   AROM / PROM / Strength Strength      Strength   Overall Strength Deficits    Strength Assessment Site Knee    Right/Left Knee Left;Right    Right Knee Extension --   65.3 pounds   Left Knee Extension --  75.3 pounds                          [facility] Adult PT Treatment/Exercise - 07/20/21 0001       Therapeutic Activites    Therapeutic Activities ADL's    ADL's Sit to stand; leg press for stairs      Exercises   Exercises Knee/Hip      Knee/Hip Exercises: Stretches   Quad Stretch --    Sports administrator Limitations --      Knee/Hip Exercises: Aerobic   Recumbent Bike Seat 3 for 8 minutes Level 3-5      Knee/Hip Exercises: Machines for Strengthening   Cybex Knee Extension 20X Up B, down single leg (alternating) 20# slow eccentrics 90-30 degrees (avoid full extension)    Total Gym Leg Press 150# double leg and 75# single leg each leg slow eccentrics 15X each      Knee/Hip Exercises: Seated   Long Arc Quad Strengthening;Both;1 set;5 reps;Limitations    Long Arc Quad Limitations Seated straight leg raises    Other Seated Knee/Hip Exercises --    Sit to [employer] 5 reps;without UE support      Knee/Hip Exercises: Supine   Bridges --    Bridges  Limitations --                     PT Education - 07/20/21 1614     Education Details Reviewed RA findings and HEP with emphasis on the gym 3X/week. Bike, Seated SLR and bridging are the emphasis if unable to get to the gym.    Person(s) Educated Patient    Methods Explanation;Demonstration;Verbal cues    Comprehension Verbalized understanding;Returned demonstration;Need further instruction;Verbal cues required                 PT Long Term Goals - 07/20/21 1706       PT LONG TERM GOAL #1   Title Pt will be independent with HEP    Time 4    Period Weeks    Status On-going    Target Date 08/17/21      PT LONG TERM GOAL #2   Title Pt will report resolution of her knee pain    Time 4    Period Weeks    Status On-going    Target Date 08/17/21      PT LONG TERM GOAL #3   Title Pt will improve FOTO score to 62    Baseline 60 (was 48)    Time 4    Period Weeks    Status New    Target Date 08/17/21      PT LONG TERM GOAL #4   Title Pt will be able to demo at least 4+/5 knee and hip strength    Baseline Improving and will benefit from additional work.    Time 4    Period Weeks    Status New    Target Date 08/17/21                   Plan - 07/20/21 1709     Clinical Impression Statement [name] reports a "significant" decrease in her B hip and knee pain since starting physical therapy.  She will benefit from another 2-4 weeks to prepare her for transition into independent rehabilitation at home and at her gym.  R sided symptoms still limit function although I expect upcoming changes to her program to address this.  [name] will also benefit from a R sided Epley to address vertigo that is affecting her ability to perform supine exercises.  I anticipate DC to independent PT in 2-4 weeks.    Personal Factors and Comorbidities Age;Past/Current Experience;Fitness    Examination-Activity Limitations Squat;Stand;Locomotion Level    Examination-Participation  Restrictions Community Activity;Shop;Yard Work    Stability/Clinical Decision Making Stable/Uncomplicated    Rehab Potential Good    PT Frequency 1x / week    PT Duration 4 weeks    PT Treatment/Interventions ADLs/Self Care Home Management;Cryotherapy;Electrical Stimulation;Iontophoresis 4mg /ml Dexamethasone;Moist Heat;Ultrasound;Gait training;Stair training;Functional mobility training;Therapeutic activities;Therapeutic exercise;Balance training;Neuromuscular re-education;Patient/family education;Orthotic Fit/Training;Manual techniques;Passive range of motion;Dry needling;Vasopneumatic Device;Taping;Vestibular;Canalith Repostioning    PT Next Visit Plan R sided Epley.  Balance for hip strength.    PT Home Exercise Plan Access Code: [email or web address]    Consulted and Agree with Plan of Care Patient             Patient will benefit from skilled therapeutic intervention in order to improve the following deficits and impairments:  Decreased range of motion, Decreased strength, Increased fascial restricitons, Pain, Improper body mechanics, Decreased balance, Decreased mobility, Increased edema, Postural dysfunction, Dizziness, Difficulty walking  Visit Diagnosis: Difficulty in walking, not elsewhere classified  Stiffness of left knee, not elsewhere classified  Acute pain of left knee  Muscle weakness (generalized)     Problem List Patient Active Problem List   Diagnosis Date Noted   Acute pain of left knee 06/03/2021   Acute bilateral low back pain with left-sided sciatica 06/03/2021   Left hip pain 06/03/2021   Chronic constipation 11/18/2020   Anxiety 02/11/2019   Rash 11/07/2017   Elevated LFTs 12/04/2016   Routine general medical examination at a health care facility 07/11/2016   S/P laparoscopic sleeve gastrectomy 10/14/2015   Multiple gastric polyps 08/18/2015   Trigger thumb of left hand 04/05/2015   OSA (obstructive sleep apnea) 02/05/2013   Morbid obesity ([place]) 09/26/2010    MIGRAINE HEADACHE 02/09/2010   ALLERGIC RHINITIS 02/09/2010   Asthma 02/09/2010   GERD 02/09/2010   Arthritis 02/09/2010    Farley Ly, PT, MPT 07/20/2021, 5:13 PM   PHYSICAL THERAPY DISCHARGE SUMMARY  Visits from Start of Care: 5  Current functional level related to goals / functional outcomes: See note   Remaining deficits: See note   Education / Equipment: HEP   Patient agrees to discharge. Patient goals were partially met. Patient is being discharged due to not returning since the last visit.  Scot Jun, PT, DPT, OCS, ATC 01/19/22  1:38 PM     [facility] [address] [place], [place], [address] Phone: [phone]   Fax:  [phone]  Name: TYNIESHA HOWALD MRN: [id number] Date of Birth: 09/12/1965

## 2021-07-20 NOTE — Patient Instructions (Addendum)
Access Code: [email or web address] URL: [email or web address] Date: 07/20/2021 Prepared by: Vista Mink  Exercises Clamshell - 1 x daily - 7 x weekly - 2 sets - 10 reps Prone Quadriceps Stretch with Strap - 1 x daily - 7 x weekly - 2 sets - 30 sec hold Supine Bridge - 1 x daily - 7 x weekly - 2 sets - 10 reps Seated Straight Leg Raise - 1-2 x daily - 7 x weekly - 3-5 sets - 5 reps - 3 seconds hold

## 2021-07-21 NOTE — Progress Notes (Signed)
Right knee x-ray shows some early arthritis underneath the kneecap but otherwise the x-ray looks okay.

## 2021-09-28 ENCOUNTER — Other Ambulatory Visit: Payer: Self-pay

## 2021-09-28 ENCOUNTER — Ambulatory Visit
Admission: RE | Admit: 2021-09-28 | Discharge: 2021-09-28 | Disposition: A | Discharge status: Home / Self Care | Payer: 59 | Source: Ambulatory Visit | Attending: Physician Assistant | Admitting: Physician Assistant

## 2021-09-28 VITALS — BP 137/81 | HR 88 | Temp 98.3°F | Resp 18

## 2021-09-28 DIAGNOSIS — H8110 Benign paroxysmal vertigo, unspecified ear: Secondary | ICD-10-CM | POA: Diagnosis not present

## 2021-09-28 MED ORDER — MECLIZINE HCL 25 MG PO TABS: 25.0000 mg | ORAL_TABLET | Freq: Three times a day (TID) | ORAL | 0 refills | Status: AC | PRN

## 2021-09-28 NOTE — ED Provider Notes (Signed)
[facility]    CSN: [id number] Arrival date & time: 09/28/21  [place]      History   Chief Complaint Chief Complaint  Patient presents with   Dizziness    HPI Alicia Chen is a 56 y.o. female.   Patient here today for evaluation of dizziness that is similar to prior episodes of vertigo.  She states symptoms started when her dentist laid her back further than she is used to and she started to feel like the room was spinning at that time.  She now has worsening symptoms when she turns her head certain ways.  She has not had any fever.  She does not report headache.  She does report some right ear discomfort.  She does not report treatment for symptoms.  The history is provided by the patient.   Past Medical History:  Diagnosis Date   ALLERGIC RHINITIS    ANEMIA-NOS    ARTHRITIS    ASTHMA    DEPRESSION    Fibroid    GERD    MIGRAINE HEADACHE    OBESITY, MORBID    Vitamin D deficiency     Patient Active Problem List   Diagnosis Date Noted   Acute pain of left knee 06/03/2021   Acute bilateral low back pain with left-sided sciatica 06/03/2021   Left hip pain 06/03/2021   Chronic constipation 11/18/2020   Anxiety 02/11/2019   Rash 11/07/2017   Elevated LFTs 12/04/2016   Routine general medical examination at a health care facility 07/11/2016   S/P laparoscopic sleeve gastrectomy 10/14/2015   Multiple gastric polyps 08/18/2015   Trigger thumb of left hand 04/05/2015   OSA (obstructive sleep apnea) 02/05/2013   Morbid obesity ([place]) 09/26/2010   MIGRAINE HEADACHE 02/09/2010   ALLERGIC RHINITIS 02/09/2010   Asthma 02/09/2010   GERD 02/09/2010   Arthritis 02/09/2010    Past Surgical History:  Procedure Laterality Date   CHOLECYSTECTOMY  2009   ESOPHAGOGASTRODUODENOSCOPY N/A 12/23/2012   Procedure: ESOPHAGOGASTRODUODENOSCOPY (EGD);  Surgeon: Juanita Craver, MD;  Location: [facility];  Service: Endoscopy;  Laterality: N/A;   INTRAUTERINE DEVICE (IUD)  INSERTION  12-15-13   Mirena   LAPAROSCOPIC GASTRIC SLEEVE RESECTION  2/17   ROOT CANAL     Skin graft surgery (R) leg     childhood    OB History     Gravida  2   Para  2   Term  1   Preterm  1   AB      Living  2      SAB      IAB      Ectopic      Multiple  2   Live Births  4            Home Medications    Prior to Admission medications   Medication Sig Start Date End Date Taking? Authorizing Provider  meclizine (ANTIVERT) 25 MG tablet Take 1 tablet (25 mg total) by mouth 3 (three) times daily as needed for dizziness. 09/28/21  Yes Francene Finders, PA-C  albuterol (VENTOLIN HFA) 108 (90 Base) MCG/ACT inhaler Inhale 1-2 puffs into the lungs every 4 (four) hours as needed for wheezing or shortness of breath. 08/16/20   Binnie Rail, MD  budesonide-formoterol (SYMBICORT) 160-4.5 MCG/ACT inhaler INHALE 2 PUFFS INTO THE LUNGS TWICE DAILY 08/11/20   Hoyt Koch, MD  celecoxib (CELEBREX) 100 MG capsule Take 1 capsule (100 mg total) by mouth 2 (two) times daily  as needed. 06/07/21   Gregor Hams, MD  Cholecalciferol (VITAMIN D) 2000 units tablet Take 2,000 Units by mouth daily.    [provider]  ciclopirox (LOPROX) 0.77 % cream APPLY TOPICALLY TO THE AFFECTED AREA TWICE DAILY 12/31/19   [provider]  citalopram (CELEXA) 10 MG tablet Take 1 tablet (10 mg total) by mouth daily. Take with the 20 mg of celexa for a total dose of 30 mg 03/23/21   Salvadore Dom, MD  citalopram (CELEXA) 20 MG tablet Take 1 tablet a day. Take with the 10 mg of Celexa for a total dose of 30 mg a day 03/23/21   Salvadore Dom, MD  famotidine (PEPCID) 20 MG tablet Take 1 tablet (20 mg total) by mouth 2 (two) times daily as needed for heartburn or indigestion. 07/11/16   Hoyt Koch, MD  linaclotide Rolan Lipa) 290 MCG CAPS capsule Take 1 capsule (290 mcg total) by mouth daily before breakfast. 11/18/20   Biagio Borg, MD  loratadine (CLARITIN) 10  MG tablet Take 1 tablet (10 mg total) by mouth daily. 07/02/17   Hoyt Koch, MD  Multiple Vitamin (MULTIVITAMIN) tablet Take 1 tablet by mouth daily.    [provider]  nystatin ointment (MYCOSTATIN) Apply topically 2 (two) times daily. 06/20/21   Hoyt Koch, MD  omeprazole (PRILOSEC) 20 MG capsule TAKE 1 CAPSULE(20 MG) BY MOUTH DAILY 12/27/20   Hoyt Koch, MD  Phentermine-Topiramate [facility]) 11.25-69 MG CP24 Take 1 capsule by mouth daily. 05/17/21   [provider]  SUMAtriptan (IMITREX) 50 MG tablet Take 1 tablet (50 mg total) by mouth every 2 (two) hours as needed for migraine. May repeat in 2 hours if headache persists or recurs. 12/25/19   Hoyt Koch, MD  terbinafine (LAMISIL) 250 MG tablet Take 1 tablet (250 mg total) by mouth daily. 04/20/20   Evelina Bucy, DPM    Family History Family History  Problem Relation Age of Onset   Arthritis Mother    Arthritis Father    Alcohol abuse Other        Parents   Arthritis Other        grandparents   Diabetes Other        Parent & grandparent   Hyperlipidemia Other        Parent, grandparent, other relative   Hypertension Other        parent, grandparent, other relative   Prostate cancer Other        grandfather   Breast cancer Sister    Breast cancer Sister    Breast cancer Paternal Aunt     Social History Social History   Tobacco Use   Smoking status: Never   Smokeless tobacco: Never  Vaping Use   Vaping Use: Never used  Substance Use Topics   Alcohol use: Yes    Comment: Social   Drug use: No     Allergies   Penicillins   Review of Systems Review of Systems  Constitutional:  Negative for chills and fever.  HENT:  Positive for ear pain.   Eyes:  Negative for discharge and redness.  Respiratory:  Negative for shortness of breath.   Gastrointestinal:  Negative for abdominal pain, nausea and vomiting.  Neurological:  Positive for dizziness.     Physical Exam Triage Vital Signs ED Triage Vitals  Enc Vitals Group     BP      Pulse  Resp      Temp      Temp src      SpO2      Weight      Height      Head Circumference      Peak Flow      Pain Score      Pain Loc      Pain Edu?      Excl. in [place]?    No data found.  Updated Vital Signs BP 137/81 (BP Location: Left Arm)    Pulse 88    Temp 98.3 F (36.8 C) (Oral)    Resp 18    LMP 01/18/2014    SpO2 98%  \ Physical Exam Vitals and nursing note reviewed.  Constitutional:      General: She is not in acute distress.    Appearance: Normal appearance. She is not ill-appearing.  HENT:     Head: Normocephalic and atraumatic.     Right Ear: Tympanic membrane, ear canal and external ear normal.     Left Ear: Tympanic membrane, ear canal and external ear normal.  Eyes:     Conjunctiva/sclera: Conjunctivae normal.  Cardiovascular:     Rate and Rhythm: Normal rate.  Pulmonary:     Effort: Pulmonary effort is normal. No respiratory distress.  Neurological:     Mental Status: She is alert.  Psychiatric:        Mood and Affect: Mood normal.        Behavior: Behavior normal.        Thought Content: Thought content normal.     UC Treatments / Results  Labs (all labs ordered are listed, but only abnormal results are displayed) Labs Reviewed - No data to display  EKG   Radiology No results found.  Procedures Procedures (including critical care time)  Medications Ordered in UC Medications - No data to display  Initial Impression / Assessment and Plan / UC Course  I have reviewed the triage vital signs and the nursing notes.  Pertinent labs & imaging results that were available during my care of the patient were reviewed by me and considered in my medical decision making (see chart for details).    Meclizine prescribed for suspected BPPV.  Patient does have physical therapist that she can contact for further treatment if needed.  Recommended emergency  department with any worsening symptoms.  Patient expresses understanding.  Final Clinical Impressions(s) / UC Diagnoses   Final diagnoses:  Benign paroxysmal positional vertigo, unspecified laterality   Discharge Instructions   None    ED Prescriptions     Medication Sig Dispense Auth. Provider   meclizine (ANTIVERT) 25 MG tablet Take 1 tablet (25 mg total) by mouth 3 (three) times daily as needed for dizziness. 30 tablet Francene Finders, PA-C      PDMP not reviewed this encounter.   Francene Finders, PA-C 09/28/21 1740

## 2021-09-28 NOTE — ED Triage Notes (Signed)
Pt c/o vertigo and ear fullness onset last weekend.

## 2021-10-30 ENCOUNTER — Other Ambulatory Visit: Payer: Self-pay

## 2021-10-30 ENCOUNTER — Ambulatory Visit
Admission: EM | Admit: 2021-10-30 | Discharge: 2021-10-30 | Disposition: A | Discharge status: Home / Self Care | Payer: 59 | Attending: Internal Medicine | Admitting: Internal Medicine

## 2021-10-30 DIAGNOSIS — R509 Fever, unspecified: Secondary | ICD-10-CM | POA: Diagnosis not present

## 2021-10-30 DIAGNOSIS — J4541 Moderate persistent asthma with (acute) exacerbation: Secondary | ICD-10-CM | POA: Diagnosis not present

## 2021-10-30 DIAGNOSIS — H66001 Acute suppurative otitis media without spontaneous rupture of ear drum, right ear: Secondary | ICD-10-CM | POA: Diagnosis not present

## 2021-10-30 DIAGNOSIS — J329 Chronic sinusitis, unspecified: Secondary | ICD-10-CM

## 2021-10-30 DIAGNOSIS — B349 Viral infection, unspecified: Secondary | ICD-10-CM | POA: Diagnosis not present

## 2021-10-30 DIAGNOSIS — J302 Other seasonal allergic rhinitis: Secondary | ICD-10-CM

## 2021-10-30 DIAGNOSIS — J310 Chronic rhinitis: Secondary | ICD-10-CM

## 2021-10-30 MED ORDER — CEFDINIR 300 MG PO CAPS: 300.0000 mg | ORAL_CAPSULE | Freq: Two times a day (BID) | ORAL | 0 refills | Status: AC

## 2021-10-30 MED ORDER — FEXOFENADINE HCL 180 MG PO TABS: 180.0000 mg | ORAL_TABLET | Freq: Every day | ORAL | 0 refills | Status: DC

## 2021-10-30 MED ORDER — GUAIFENESIN 400 MG PO TABS: ORAL_TABLET | ORAL | 0 refills | Status: DC

## 2021-10-30 MED ORDER — CETIRIZINE HCL 10 MG PO TABS: 10.0000 mg | ORAL_TABLET | Freq: Every day | ORAL | 2 refills | Status: DC

## 2021-10-30 MED ORDER — FLUTICASONE PROPIONATE 50 MCG/ACT NA SUSP: 1.0000 | Freq: Every day | NASAL | 1 refills | Status: AC

## 2021-10-30 MED ORDER — ACETAMINOPHEN 325 MG PO TABS
650.0000 mg | ORAL_TABLET | Freq: Once | ORAL | Status: AC
  Administered 2021-10-30: 650 mg via ORAL

## 2021-10-30 MED ORDER — PROMETHAZINE-DM 6.25-15 MG/5ML PO SYRP: 5.0000 mL | ORAL_SOLUTION | Freq: Four times a day (QID) | ORAL | 0 refills | Status: DC | PRN

## 2021-10-30 MED ORDER — IPRATROPIUM BROMIDE 0.06 % NA SOLN: 2.0000 | Freq: Four times a day (QID) | NASAL | 0 refills | Status: DC

## 2021-10-30 MED ORDER — OSELTAMIVIR PHOSPHATE 75 MG PO CAPS: 75.0000 mg | ORAL_CAPSULE | Freq: Two times a day (BID) | ORAL | 0 refills | Status: AC

## 2021-10-30 NOTE — ED Triage Notes (Signed)
Pt c/o cough, sore throat, fatigue, body aches, fever, nasal drainage, diarrhea  ? ?Denies earache, headache, nausea, vomiting, constipation  ? ?Onset ~ Friday night  ?

## 2021-10-30 NOTE — ED Provider Notes (Signed)
?[place] ? ? ? ?CSN: [id number] ?Arrival date & time: 10/30/21  0903 ?  ? ?HISTORY  ? ?Chief Complaint  ?Patient presents with  ? Cough  ? ?HPI ?Alicia Chen is a 56 y.o. female. Pt c/o cough, sore throat, fatigue, body aches, fever, nasal drainage, diarrhea that began with an acute onset yesterday morning.  Patient denies earache, headache, nausea, vomiting, constipation however endorses history of frequent otitis media in both ears, significant allergies and asthma.  Patient currently on Symbicort and albuterol as needed as well is taking Claritin and Zyrtec for allergies.  Not currently using a nasal steroid.  Patient states her husband has also been coughing a little bit but does not feel as bad as she does.  Patient denies known sick contacts but states she did go out in public on Friday evening with her husband and also works face-to-face with public weekdays. ?  ? ? ?Cough ?Past Medical History:  ?Diagnosis Date  ? ALLERGIC RHINITIS   ? ANEMIA-NOS   ? ARTHRITIS   ? ASTHMA   ? DEPRESSION   ? Fibroid   ? GERD   ? MIGRAINE HEADACHE   ? OBESITY, MORBID   ? Vitamin D deficiency   ? ?Patient Active Problem List  ? Diagnosis Date Noted  ? Acute pain of left knee 06/03/2021  ? Acute bilateral low back pain with left-sided sciatica 06/03/2021  ? Left hip pain 06/03/2021  ? Chronic constipation 11/18/2020  ? Anxiety 02/11/2019  ? Rash 11/07/2017  ? Elevated LFTs 12/04/2016  ? Routine general medical examination at a health care facility 07/11/2016  ? S/P laparoscopic sleeve gastrectomy 10/14/2015  ? Multiple gastric polyps 08/18/2015  ? Trigger thumb of left hand 04/05/2015  ? OSA (obstructive sleep apnea) 02/05/2013  ? Morbid obesity ([place]) 09/26/2010  ? MIGRAINE HEADACHE 02/09/2010  ? ALLERGIC RHINITIS 02/09/2010  ? Asthma 02/09/2010  ? GERD 02/09/2010  ? Arthritis 02/09/2010  ? ?Past Surgical History:  ?Procedure Laterality Date  ? CHOLECYSTECTOMY  2009  ? ESOPHAGOGASTRODUODENOSCOPY N/A 12/23/2012  ?  Procedure: ESOPHAGOGASTRODUODENOSCOPY (EGD);  Surgeon: Juanita Craver, MD;  Location: [facility];  Service: Endoscopy;  Laterality: N/A;  ? INTRAUTERINE DEVICE (IUD) INSERTION  12-15-13  ? Mirena  ? [place] RESECTION  2/17  ? ROOT CANAL    ? Skin graft surgery (R) leg    ? childhood  ? ?OB History   ? ? Gravida  ?2  ? Para  ?2  ? Term  ?1  ? Preterm  ?1  ? AB  ?   ? Living  ?2  ?  ? ? SAB  ?   ? IAB  ?   ? Ectopic  ?   ? Multiple  ?2  ? Live Births  ?4  ?   ?  ?  ? ?Home Medications   ? ?Prior to Admission medications   ?Medication Sig Start Date End Date Taking? Authorizing Provider  ?cefdinir (OMNICEF) 300 MG capsule Take 1 capsule (300 mg total) by mouth 2 (two) times daily for 10 days. 10/30/21 11/09/21 Yes Lynden Oxford Scales, PA-C  ?fluticasone (FLONASE) 50 MCG/ACT nasal spray Place 1 spray into both nostrils daily. Begin by using 2 sprays in each nare daily for 3 to 5 days, then decrease to 1 spray in each nare daily. 10/30/21  Yes Lynden Oxford Scales, PA-C  ?guaifenesin (HUMIBID E) 400 MG TABS tablet Take 1 tablet 3 times daily as needed for chest congestion  and cough 10/30/21  Yes Lynden Oxford Scales, PA-C  ?ipratropium (ATROVENT) 0.06 % nasal spray Place 2 sprays into both nostrils 4 (four) times daily. As needed for nasal congestion, runny nose 10/30/21  Yes Lynden Oxford Scales, PA-C  ?oseltamivir (TAMIFLU) 75 MG capsule Take 1 capsule (75 mg total) by mouth every 12 (twelve) hours for 5 days. 10/30/21 11/04/21 Yes Lynden Oxford Scales, PA-C  ?promethazine-dextromethorphan (PROMETHAZINE-DM) 6.25-15 MG/5ML syrup Take 5 mLs by mouth 4 (four) times daily as needed for cough. 10/30/21  Yes Lynden Oxford Scales, PA-C  ?albuterol (VENTOLIN HFA) 108 (90 Base) MCG/ACT inhaler Inhale 1-2 puffs into the lungs every 4 (four) hours as needed for wheezing or shortness of breath. 08/16/20   Binnie Rail, MD  ?budesonide-formoterol [facility]) 160-4.5 MCG/ACT inhaler INHALE 2 PUFFS INTO THE  LUNGS TWICE DAILY 08/11/20   Hoyt Koch, MD  ?celecoxib (CELEBREX) 100 MG capsule Take 1 capsule (100 mg total) by mouth 2 (two) times daily as needed. 06/07/21   Gregor Hams, MD  ?Cholecalciferol (VITAMIN D) 2000 units tablet Take 2,000 Units by mouth daily.    [provider]  ?ciclopirox (LOPROX) 0.77 % cream APPLY TOPICALLY TO THE AFFECTED AREA TWICE DAILY 12/31/19   [provider]  ?citalopram (CELEXA) 10 MG tablet Take 1 tablet (10 mg total) by mouth daily. Take with the 20 mg of celexa for a total dose of 30 mg 03/23/21   Salvadore Dom, MD  ?citalopram (CELEXA) 20 MG tablet Take 1 tablet a day. Take with the 10 mg of Celexa for a total dose of 30 mg a day 03/23/21   Salvadore Dom, MD  ?famotidine (PEPCID) 20 MG tablet Take 1 tablet (20 mg total) by mouth 2 (two) times daily as needed for heartburn or indigestion. 07/11/16   Hoyt Koch, MD  ?fexofenadine (ALLEGRA) 180 MG tablet Take 1 tablet (180 mg total) by mouth daily. 10/30/21 01/28/22  Lynden Oxford Scales, PA-C  ?linaclotide Rolan Lipa) 290 MCG CAPS capsule Take 1 capsule (290 mcg total) by mouth daily before breakfast. 11/18/20   Biagio Borg, MD  ?meclizine (ANTIVERT) 25 MG tablet Take 1 tablet (25 mg total) by mouth 3 (three) times daily as needed for dizziness. 09/28/21   Francene Finders, PA-C  ?Multiple Vitamin (MULTIVITAMIN) tablet Take 1 tablet by mouth daily.    [provider]  ?omeprazole (PRILOSEC) 20 MG capsule TAKE 1 CAPSULE(20 MG) BY MOUTH DAILY 12/27/20   Hoyt Koch, MD  ?Phentermine-Topiramate [facility]) 11.25-69 MG CP24 Take 1 capsule by mouth daily. 05/17/21   [provider]  ?SUMAtriptan (IMITREX) 50 MG tablet Take 1 tablet (50 mg total) by mouth every 2 (two) hours as needed for migraine. May repeat in 2 hours if headache persists or recurs. 12/25/19   Hoyt Koch, MD  ? ?Family History ?Family History  ?Problem Relation Age of Onset  ? Arthritis  Mother   ? Arthritis Father   ? Alcohol abuse Other   ?     Parents  ? Arthritis Other   ?     grandparents  ? Diabetes Other   ?     Parent & grandparent  ? Hyperlipidemia Other   ?     Parent, grandparent, other relative  ? Hypertension Other   ?     parent, grandparent, other relative  ? Prostate cancer Other   ?     grandfather  ? Breast cancer Sister   ?  Breast cancer Sister   ? Breast cancer Paternal Aunt   ? ?Social History ?Social History  ? ?Tobacco Use  ? Smoking status: Never  ? Smokeless tobacco: Never  ?Vaping Use  ? Vaping Use: Never used  ?Substance Use Topics  ? Alcohol use: Yes  ?  Comment: Social  ? Drug use: No  ? ?Allergies   ?Penicillins ? ?Review of Systems ?Review of Systems  ?Respiratory:  Positive for cough.   ?Pertinent findings noted in history of present illness.  ? ?Physical Exam ?Triage Vital Signs ?ED Triage Vitals  ?Enc Vitals Group  ?   BP 06/03/21 0827 (!) 147/82  ?   Pulse Rate 06/03/21 0827 72  ?   Resp 06/03/21 0827 18  ?   Temp 06/03/21 0827 98.3 ?F (36.8 ?C)  ?   Temp Source 06/03/21 0827 Oral  ?   SpO2 06/03/21 0827 98 %  ?   Weight --   ?   Height --   ?   Head Circumference --   ?   Peak Flow --   ?   Pain Score 06/03/21 0826 5  ?   Pain Loc --   ?   Pain Edu? --   ?   Excl. in [place]? --   ?No data found. ? ?Updated Vital Signs ?BP (!) 146/97 (BP Location: Right Arm)   Pulse (!) 105   Temp (!) 101.2 ?F (38.4 ?C) (Oral)   Resp 18   LMP 01/18/2014   SpO2 96%  ? ?Physical Exam ?Vitals and nursing note reviewed.  ?Constitutional:   ?   General: She is not in acute distress. ?   Appearance: Normal appearance. She is ill-appearing.  ?HENT:  ?   Head: Normocephalic and atraumatic.  ?   Salivary Glands: Right salivary gland is not diffusely enlarged or tender. Left salivary gland is not diffusely enlarged or tender.  ?   Right Ear: Ear canal and external ear normal. No drainage. A middle ear effusion is present. There is no impacted cerumen. Tympanic membrane is bulging. Tympanic  membrane is not injected or erythematous.  ?   Left Ear: Ear canal and external ear normal. No drainage. A middle ear effusion is present. There is no impacted cerumen. Tympanic membrane is bulging. Tympanic memb

## 2021-10-30 NOTE — Discharge Instructions (Signed)
Physical exam today revealed that you have a bacterial infection in your right ear.  I recommend that you begin antibiotics now. ? ?Your symptoms and physical exam findings are also concerning for a viral respiratory infection.  You were tested for both COVID and influenza today, the result of your viral testing will be posted to your MyChart once it is complete, this typically takes 24 to 48 hours.  If there is a positive result, you will be contacted by phone with further recommendations, if any.  ?  ?I recommend that you begin taking Tamiflu now for empiric treatment of presumed influenza based on the history provided to me today along with my physical exam findings. Tamiflu is an antiviral medication that decreases the severity, duration and transmissibility of influenza virus by preventing the virus from reproducing itself in your body.   ?  ?If the influenza result is positive, please continue the full 5-day course of Tamiflu.  If the result is negative, please feel free to discontinue Tamiflu if you prefer but do keep in mind that Tamiflu can also be taken preventatively.  Finishing the full 5-day course will decrease the chances of catching influenza from anyone else and will not cause harm otherwise.  ?   ?Your symptoms and my physical exam findings are also concerning for exacerbation of your underlying allergies.  It is important that you are consistent with taking allergy medications exactly as prescribed.   ?  ?Please see the list below for recommended medications, dosages and frequencies to provide relief of your current symptoms:   ?  ?Cefdinir (Omnicef):  1 capsule twice daily for 10 days, you can take it with or without food.  This antibiotic can cause upset stomach, this will resolve once antibiotics are complete.  You are welcome to use a probiotic, eat yogurt, take Imodium while taking this medication.  Please avoid other systemic medications such as Maalox, Pepto-Bismol or milk of magnesia as  they can interfere with your body's ability to absorb the antibiotics. ?  ?Ibuprofen  (Advil, Motrin): This is a good anti-inflammatory medication which not only addresses aches, pains but also significantly reduces soft tissue inflammation of the upper airways that causes sinus and nasal congestion as well as inflammation of the lower airways which makes you feel like your breathing is constricted or your cough feel tight.  I recommend that you take between 400 mg every 8 hours as needed.    ?  ?Allegra (fexofenadine): This is an excellent second-generation antihistamine that helps to reduce respiratory inflammatory response to environmental allergens.  This medication is not known to cause daytime sleepiness so it can be taken in the daytime.  If you find that it does make you sleepy, please feel free to take it at bedtime. ?  ?Flonase (fluticasone): This is a steroid nasal spray that you use once daily, 1 spray in each nare.  This medication does not work well if you decide to use it only used as you feel you need to, it works best used on a daily basis.  After 3 to 5 days of use, you will notice significant reduction of the inflammation and mucus production that is currently being caused by exposure to allergens, whether seasonal or environmental.  The most common side effect of this medication is nosebleeds.  If you experience a nosebleed, please discontinue use for 1 week, then feel free to resume.  I have provided you with a prescription but you can also purchase this  medication over-the-counter if your insurance will not cover it. ?  ?Ipratropium (Atrovent): This is an excellent nasal decongestant spray that does not cause rebound congestion, please instill 2 sprays into each nare with each use.  Because nasal steroids can take several days before they begin to provide full benefit, I recommend that you use this spray in addition to the nasal steroid prescribed for you.  Please use it after you have used  your nasal steroid and repeat up to 4 times daily as needed.  I have provided you with a prescription for this medication.    ? ?Albuterol HFA: This is a bronchodilator, it relaxes the smooth muscles that constrict your airway in your lungs when you are feeling sick or having inflammation secondary to allergies or upper respiratory infection.  Please inhale 2 puffs twice daily every day using the spacer provided.  You can also inhale 2 more puffs as often as needed throughout the day for aggravating cough, chest tightness, feeling short of breath, wheezing.    ?  ?Guaifenesin (Robitussin, Mucinex): This is an expectorant.  This helps break up chest congestion and loosen up thick nasal drainage making phlegm and drainage more liquid and therefore easier to remove.  I recommend being 400 mg three times daily as needed.    ?  ?Promethazine DM: Promethazine is both a nasal decongestant and an antinausea medication that makes most patients feel fairly sleepy.  The DM is dextromethorphan, a cough suppressant found in many over-the-counter cough medications.  Please take 5 mL before bedtime to minimize your cough which will help you sleep better.  I have provided you with a prescription for this medication.    ?  ?Conservative care is also recommended at this time.  This includes rest, pushing clear fluids and activity as tolerated.  Warm beverages such as teas and broths versus cold beverages/popsicles and frozen sherbet/sorbet are your choice, both warm and cold are beneficial.  You may also notice that your appetite is reduced; this is okay as long as you are drinking plenty of clear fluids.  ?  ?Please follow-up within the next 3 to 5 days either with your primary care provider or urgent care if your symptoms do not resolve.  If you do not have a primary care provider, we will assist you in finding one. ?  ?Thank you for visiting urgent care today.  We appreciate the opportunity to participate in your care. ? ?

## 2021-11-01 ENCOUNTER — Encounter: Payer: Self-pay | Admitting: Internal Medicine

## 2021-11-01 LAB — COVID-19, FLU A+B NAA
Influenza A, NAA: NOT DETECTED
Influenza B, NAA: NOT DETECTED
SARS-CoV-2, NAA: DETECTED — AB

## 2021-11-02 ENCOUNTER — Telehealth (HOSPITAL_COMMUNITY): Payer: Self-pay | Admitting: Emergency Medicine

## 2021-11-02 ENCOUNTER — Encounter: Payer: 59 | Admitting: Physician Assistant

## 2021-11-02 MED ORDER — NIRMATRELVIR/RITONAVIR (PAXLOVID) TABLET (RENAL DOSING): 2.0000 | ORAL_TABLET | Freq: Two times a day (BID) | ORAL | 0 refills | Status: AC

## 2021-11-02 NOTE — Progress Notes (Signed)
The patient no-showed for appointment despite this provider sending direct link and waiting for at least 10 minutes from appointment time for patient to join. However, it was noted that they were called by [facility], with whom they saw on 10/30/21, and prescribed Paxlovid already. Will mark appt erroneous vs NOS since it seems as a possible duplicate encounter. ? ?Mar Daring, PA-C ? ? ? ?

## 2021-11-04 MED ORDER — BUSPIRONE HCL 5 MG PO TABS: 5.0000 mg | ORAL_TABLET | Freq: Two times a day (BID) | ORAL | 0 refills | Status: DC | PRN

## 2022-01-16 ENCOUNTER — Other Ambulatory Visit: Payer: Self-pay | Admitting: Internal Medicine

## 2022-02-03 ENCOUNTER — Encounter: Payer: Self-pay | Admitting: Internal Medicine

## 2022-02-03 ENCOUNTER — Ambulatory Visit: Payer: 59 | Admitting: Internal Medicine

## 2022-02-03 VITALS — BP 128/80 | HR 91 | Resp 18 | Ht 63.0 in | Wt 245.4 lb

## 2022-02-03 DIAGNOSIS — F419 Anxiety disorder, unspecified: Secondary | ICD-10-CM | POA: Diagnosis not present

## 2022-02-03 DIAGNOSIS — R0602 Shortness of breath: Secondary | ICD-10-CM | POA: Diagnosis not present

## 2022-02-03 MED ORDER — ALPRAZOLAM 0.25 MG PO TABS: 0.2500 mg | ORAL_TABLET | Freq: Every day | ORAL | 0 refills | Status: DC | PRN

## 2022-02-03 MED ORDER — OZEMPIC (0.25 OR 0.5 MG/DOSE) 2 MG/3ML ~~LOC~~ SOPN: PEN_INJECTOR | SUBCUTANEOUS | 0 refills | Status: DC

## 2022-02-03 NOTE — Progress Notes (Signed)
   Subjective:   Patient ID: Alicia Chen, female    DOB: May 12, 1966, 56 y.o.   MRN: [id number]  HPI The patient is a 56 YO female coming in for concerns.  Review of Systems  Constitutional:  Positive for activity change, appetite change and unexpected weight change.  HENT: Negative.    Eyes: Negative.   Respiratory:  Negative for cough, chest tightness and shortness of breath.   Cardiovascular:  Negative for chest pain, palpitations and leg swelling.  Gastrointestinal:  Negative for abdominal distention, abdominal pain, constipation, diarrhea, nausea and vomiting.  Musculoskeletal: Negative.   Skin: Negative.   Neurological: Negative.   Psychiatric/Behavioral:  Positive for dysphoric mood. Negative for hallucinations. The patient is nervous/anxious.     Objective:  Physical Exam Constitutional:      Appearance: She is well-developed. She is obese.  HENT:     Head: Normocephalic and atraumatic.  Cardiovascular:     Rate and Rhythm: Normal rate and regular rhythm.  Pulmonary:     Effort: Pulmonary effort is normal. No respiratory distress.     Breath sounds: Normal breath sounds. No wheezing or rales.  Abdominal:     General: Bowel sounds are normal. There is no distension.     Palpations: Abdomen is soft.     Tenderness: There is no abdominal tenderness. There is no rebound.  Musculoskeletal:     Cervical back: Normal range of motion.     Right lower leg: Edema present.     Left lower leg: Edema present.  Skin:    General: Skin is warm and dry.  Neurological:     Mental Status: She is alert and oriented to person, place, and time.     Coordination: Coordination normal.     Vitals:   02/03/22 1600  BP: 128/80  Pulse: 91  Resp: 18  SpO2: 99%  Weight: 245 lb 6.4 oz (111.3 kg)  Height: '5\' 3"'$  (1.6 m)    Assessment & Plan:

## 2022-02-03 NOTE — Assessment & Plan Note (Signed)
With swelling more prominent left side than right and heart problems in the family checking echo. If normal can consider calcium score. No hypertension or diabetes. Some high cholesterol and she wants to work on diet and recheck in 3-6 months.

## 2022-02-03 NOTE — Assessment & Plan Note (Signed)
Wants to try ozempic and willing to purchase since she does not have coverage for wegovy. Rx 0.25 mg weekly for 4 weeks then increase to 0.5 mg weekly for 4 weeks then check in with [place]. Can increase per schedule as tolerated.

## 2022-02-03 NOTE — Assessment & Plan Note (Signed)
Recent loss of multiple family members and tried buspar which did not help. Has rarely used xanax in the past and agree with this for short term usage. Alprazolam 0.25 mg daily prn #30 no refills done today and she understands this is for short term usage.

## 2022-02-03 NOTE — Patient Instructions (Addendum)
We will check the ultrasound of the heart to see the heart function and valves.  For ozempic do 0.25 mg weekly for 4 weeks and then increase to 0.5 mg weekly for 4 weeks then let us know how you are doing and we will keep going up until you get to max dose or get side effects.

## 2022-02-09 ENCOUNTER — Telehealth: Payer: Self-pay | Admitting: *Deleted

## 2022-02-09 NOTE — Telephone Encounter (Signed)
Rec'd determinatin med has been DENIED. It states " This request has received an Unfavorable outcome. medicine is covered only if: All of the following: (1) You have a diagnosis of type 2 diabetes mellitus confirmed by accepted laboratory testing methodologies per treatment guidelines (for example, A1C greater than or equal to 6.5%, fasting plasma glucose greater than or equal to '126mg'$ /dL and/or 2-hour plasma glucose greater than or equal../lmb

## 2022-02-09 NOTE — Telephone Encounter (Signed)
Rec'd fax PA pt need PA on Ozempic 0.25 mg. Submitted PA w/ (Key: [id number]). Rec;d msg " Your information has been sent to [facility].".Marland Kitchen/lm,b

## 2022-03-06 ENCOUNTER — Telehealth (HOSPITAL_COMMUNITY): Payer: Self-pay | Admitting: Internal Medicine

## 2022-03-06 NOTE — Telephone Encounter (Signed)
We have attempted to contact the ordering providers office to obtain a prior authorization for the ordered test.  However, we have been unsuccesful. Order will be removed from the active order WQ. Once the prior authorization is obtained we will reinstate the order and schedule patient.   03/06/22 Ordered cancelled due to no response from Ordering MD office for PA#  02/27/22 Inbasket sent x 3 for PA#  02/20/22 Inbasket sent for PA# x2  inbasket sent 02/06/22'@1106'$  for PA#    Thank you

## 2022-03-06 NOTE — Telephone Encounter (Signed)
Can you clarify what test it is you are referring to? Alicia Chen can you help with a PA if needed for this testing?

## 2022-03-08 ENCOUNTER — Ambulatory Visit: Payer: 59 | Admitting: Family Medicine

## 2022-03-08 ENCOUNTER — Ambulatory Visit: Payer: Self-pay

## 2022-03-08 VITALS — BP 136/84 | HR 71 | Ht 63.0 in | Wt 244.0 lb

## 2022-03-08 DIAGNOSIS — M25561 Pain in right knee: Secondary | ICD-10-CM | POA: Diagnosis not present

## 2022-03-08 DIAGNOSIS — G8929 Other chronic pain: Secondary | ICD-10-CM

## 2022-03-08 NOTE — Progress Notes (Unsigned)
I, Alicia Chen, LAT, ATC acting as a scribe for Alicia Leader, MD.  Alicia Chen is a 56 y.o. female who presents to [place] at [facility] today for R knee pain. Pt was last seen by Dr. Georgina Snell on 07/19/21 for her R knee pain and completed prior course of PT. Today, pt reports R knee pain returned after the 4th of July after she was doing a lot of dancing. Pt locates pain to the anterior and posterior aspect of the R knee.   R knee swelling: unable to determine  Dx imaging: 07/19/21 R knee XR  Pertinent review of systems: No fevers or chills  Relevant historical information: Obesity.   Exam:  BP 136/84   Pulse 71   Ht '5\' 3"'$  (1.6 m)   Wt 244 lb (110.7 kg)   LMP 01/18/2014   SpO2 99%   BMI 43.22 kg/m  General: Well Developed, well nourished, and in no acute distress.   MSK: Right knee mild effusion otherwise normal appearing Normal motion with crepitation.  Tender palpation medial joint line. Stable ligamentous exam.    Lab and Radiology Results  Procedure: Real-time Ultrasound Guided Injection of right knee superior lateral patellar space Device: Philips Affiniti 50G Images permanently stored and available for review in PACS Verbal informed consent obtained.  Discussed risks and benefits of procedure. Warned about infection, bleeding, hyperglycemia damage to structures among others. Patient expresses understanding and agreement Time-out conducted.   Noted no overlying erythema, induration, or other signs of local infection.   Skin prepped in a sterile fashion.   Local anesthesia: Topical Ethyl chloride.   With sterile technique and under real time ultrasound guidance: 40 mg of Kenalog and 2 mL of Marcaine injected into knee joint. Fluid seen entering the joint capsule.   Completed without difficulty   Pain immediately resolved suggesting accurate placement of the medication.   Advised to call if fevers/chills, erythema, induration, drainage, or  persistent bleeding.   Images permanently stored and available for review in the ultrasound unit.  Impression: Technically successful ultrasound guided injection.  EXAM: RIGHT KNEE 3 VIEWS   COMPARISON:  None.   FINDINGS: Joint space narrowing and spurring in the patellofemoral compartment. No acute bony abnormality. Specifically, no fracture, subluxation, or dislocation. No joint effusion.   IMPRESSION: Early degenerative changes in the patellofemoral compartment. No acute bony abnormality.     Electronically Signed   By: Rolm Baptise M.D.   On: 07/20/2021 23:34 I, Alicia Chen, personally (independently) visualized and performed the interpretation of the images attached in this note.      Assessment and Plan: 56 y.o. female with right knee pain due to exacerbation of DJD.  Plan for steroid injection today.  Also recommend Voltaren gel topically.  Recommend avoiding oral NSAIDs if possible.  Recheck back as needed.   We also talked about weight management strategies as well as weight loss will be helpful.  PDMP not reviewed this encounter. Orders Placed This Encounter  Procedures   [place] LIMITED JOINT SPACE STRUCTURES LOW RIGHT(NO LINKED CHARGES)    Order Specific Question:   Reason for Exam (SYMPTOM  OR DIAGNOSIS REQUIRED)    Answer:   right knee pain    Order Specific Question:   Preferred imaging location?    Answer:   [place]   No orders of the defined types were placed in this encounter.    Discussed warning signs or symptoms. Please see discharge instructions. Patient  expresses understanding.   The above documentation has been reviewed and is accurate and complete Alicia Chen, M.D.

## 2022-03-08 NOTE — Patient Instructions (Addendum)
Thank you for coming in today.   Call or go to the ER if you develop a large red swollen joint with extreme pain or oozing puss.    Please use Voltaren gel (Generic Diclofenac Gel) up to 4x daily for pain as needed.  This is available over-the-counter as both the name brand Voltaren gel and the generic diclofenac gel.   Recheck as needed.

## 2022-03-15 ENCOUNTER — Ambulatory Visit (HOSPITAL_COMMUNITY): Payer: 59 | Attending: Internal Medicine

## 2022-03-15 DIAGNOSIS — R0602 Shortness of breath: Secondary | ICD-10-CM | POA: Diagnosis present

## 2022-03-15 LAB — ECHOCARDIOGRAM COMPLETE
Area-P 1/2: 3.36 cm2
S' Lateral: 2.6 cm

## 2022-04-04 NOTE — Progress Notes (Signed)
56 y.o. G68P1102 Divorced Black or [place] American Not Hispanic or Latino female here for annual exam.  No vaginal bleeding.   She has mild urge incontinence, stable and tolerable.   She noticed a small knot on her right breast last week. Not sure if she bruised her breast, it was tender, no longer tender.     Her 27 year old brother died this year. She has 5 other siblings.   Her good friend died unexpectedly the week prior to her brother. She has lost 2 Aunts, 2 cousin's and 2 more friends. Lots of loss.    She and her fiance broke up earlier this year. She is doing okay, feels free.  She has been dating an old friend. Initially hurt to be sexually active, felt like she tore. No longer tender.   Mom has dementia.   She has a h/o anxiety and depression. She is on Celexa. Plans to go back to therapy.   Patient's last menstrual period was 01/18/2014.          Sexually active: Yes.    The current method of family planning is post menopausal status.    Exercising: Yes.     Gym 2x a week and walking  Smoker:  no  Health Maintenance: Pap:  03/23/21 WNL Hr HPV Neg, 02/14/16 wnl Hr Hpv Neg  History of abnormal Pap:  no MMG:  04/11/21 density B Bi-rads 1 neg  BMD:   none Colonoscopy 2018, f/u 10 years TDaP:  02/27/18 Gardasil: na   reports that she has never smoked. She has never used smokeless tobacco. She reports current alcohol use. She reports that she does not use drugs. 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 Considering leaving [employer] and doing something else part time. She has twin daughters, 65. One of her daughters has Crohn's disease. They live with each other here in [place].  Past Medical History:  Diagnosis Date   ALLERGIC RHINITIS    ANEMIA-NOS    ARTHRITIS    ASTHMA    DEPRESSION    Fibroid    GERD    MIGRAINE HEADACHE    OBESITY, MORBID    Vitamin D deficiency     Past Surgical History:  Procedure Laterality Date   CHOLECYSTECTOMY   2009   ESOPHAGOGASTRODUODENOSCOPY N/A 12/23/2012   Procedure: ESOPHAGOGASTRODUODENOSCOPY (EGD);  Surgeon: Juanita Craver, MD;  Location: [facility];  Service: Endoscopy;  Laterality: N/A;   INTRAUTERINE DEVICE (IUD) INSERTION  12-15-13   Mirena   LAPAROSCOPIC GASTRIC SLEEVE RESECTION  2/17   ROOT CANAL     Skin graft surgery (R) leg     childhood    Current Outpatient Medications  Medication Sig Dispense Refill   albuterol (VENTOLIN HFA) 108 (90 Base) MCG/ACT inhaler Inhale 1-2 puffs into the lungs every 4 (four) hours as needed for wheezing or shortness of breath. 18 g 1   ALPRAZolam (XANAX) 0.25 MG tablet Take 1 tablet (0.25 mg total) by mouth daily as needed for anxiety. 30 tablet 0   budesonide-formoterol (SYMBICORT) 160-4.5 MCG/ACT inhaler INHALE 2 PUFFS INTO THE LUNGS TWICE DAILY 10.2 g 2   celecoxib (CELEBREX) 100 MG capsule Take 1 capsule (100 mg total) by mouth 2 (two) times daily as needed. 60 capsule 1   Cholecalciferol (VITAMIN D) 2000 units tablet Take 2,000 Units by mouth daily.     ciclopirox (LOPROX) 0.77 % cream APPLY TOPICALLY TO THE AFFECTED AREA TWICE DAILY  citalopram (CELEXA) 10 MG tablet Take 1 tablet (10 mg total) by mouth daily. Take with the 20 mg of celexa for a total dose of 30 mg 90 tablet 3   citalopram (CELEXA) 20 MG tablet Take 1 tablet a day. Take with the 10 mg of Celexa for a total dose of 30 mg a day 90 tablet 3   famotidine (PEPCID) 20 MG tablet Take 1 tablet (20 mg total) by mouth 2 (two) times daily as needed for heartburn or indigestion. 180 tablet 3   fluticasone (FLONASE) 50 MCG/ACT nasal spray Place 1 spray into both nostrils daily. Begin by using 2 sprays in each nare daily for 3 to 5 days, then decrease to 1 spray in each nare daily. 32 mL 1   ipratropium (ATROVENT) 0.06 % nasal spray Place 2 sprays into both nostrils 4 (four) times daily. As needed for nasal congestion, runny nose 15 mL 0   linaclotide (LINZESS) 290 MCG CAPS capsule Take 1 capsule  (290 mcg total) by mouth daily before breakfast. 90 capsule 1   meclizine (ANTIVERT) 25 MG tablet Take 1 tablet (25 mg total) by mouth 3 (three) times daily as needed for dizziness. 30 tablet 0   Multiple Vitamin (MULTIVITAMIN) tablet Take 1 tablet by mouth daily.     omeprazole (PRILOSEC) 20 MG capsule TAKE 1 CAPSULE(20 MG) BY MOUTH DAILY 90 capsule 3   SUMAtriptan (IMITREX) 50 MG tablet Take 1 tablet (50 mg total) by mouth every 2 (two) hours as needed for migraine. May repeat in 2 hours if headache persists or recurs. 10 tablet 5   fexofenadine (ALLEGRA) 180 MG tablet Take 1 tablet (180 mg total) by mouth daily. 90 tablet 0   No current facility-administered medications for this visit.    Family History  Problem Relation Age of Onset   Arthritis Mother    Arthritis Father    Alcohol abuse Other        Parents   Arthritis Other        grandparents   Diabetes Other        Parent & grandparent   Hyperlipidemia Other        Parent, grandparent, other relative   Hypertension Other        parent, grandparent, other relative   Prostate cancer Other        grandfather   Breast cancer Sister    Breast cancer Sister    Breast cancer Paternal Aunt     Review of Systems  All other systems reviewed and are negative.   Exam:   BP 110/68   Pulse 72   Ht '5\' 2"'$  (1.575 m)   Wt 244 lb (110.7 kg)   LMP 01/18/2014   SpO2 99%   BMI 44.63 kg/m   Weight change: '@WEIGHTCHANGE'$ @ Height:   Height: '5\' 2"'$  (157.5 cm)  Ht Readings from Last 3 Encounters:  04/18/22 '5\' 2"'$  (1.575 m)  03/08/22 '5\' 3"'$  (1.6 m)  02/03/22 '5\' 3"'$  (1.6 m)    General appearance: alert, cooperative and appears stated age Head: Normocephalic, without obvious abnormality, atraumatic Neck: no adenopathy, supple, symmetrical, trachea midline and thyroid normal to inspection and palpation Lungs: clear to auscultation bilaterally Cardiovascular: regular rate and rhythm Breasts: normal appearance, no masses or tenderness, she  states the area in question was on her skin.  Abdomen: soft, non-tender; non distended,  no masses,  no organomegaly Extremities: extremities normal, atraumatic, no cyanosis or edema Skin: Skin color, texture, turgor normal.  No rashes or lesions Lymph nodes: Cervical, supraclavicular, and axillary nodes normal. No abnormal inguinal nodes palpated Neurologic: Grossly normal   Pelvic: External genitalia:  no lesions              Urethra:  normal appearing urethra with no masses, tenderness or lesions              Bartholins and Skenes: normal                 Vagina: normal appearing vagina with normal color and discharge, no lesions              Cervix: nabothian cyst               Bimanual Exam:  Uterus:   retroverted, irregular, slightly enlarged, stable              Adnexa: no mass, fullness, tenderness               Rectovaginal: Confirms               Anus:  normal sphincter tone, no lesions  Gae Dry, CMA chaperoned for the exam.     1. Well woman exam Discussed breast self exam Discussed calcium and vit D intake She will schedule her mammogram No pap this year Colonoscopy UTD  2. Depression with anxiety - citalopram (CELEXA) 10 MG tablet; Take 1 tablet (10 mg total) by mouth daily. Take with the 20 mg of celexa for a total dose of 30 mg  Dispense: 90 tablet; Refill: 3 - citalopram (CELEXA) 20 MG tablet; Take 1 tablet a day. Take with the 10 mg of Celexa for a total dose of 30 mg a day  Dispense: 90 tablet; Refill: 3  3. Screening examination for STD (sexually transmitted disease) - RPR - HIV Antibody (routine testing w rflx) - Hepatitis C antibody - SURESWAB CT/NG/T. Vaginalis

## 2022-04-12 ENCOUNTER — Other Ambulatory Visit: Payer: Self-pay

## 2022-04-12 MED ORDER — LINACLOTIDE 290 MCG PO CAPS: 290.0000 ug | ORAL_CAPSULE | Freq: Every day | ORAL | 1 refills | Status: DC

## 2022-04-18 ENCOUNTER — Ambulatory Visit (INDEPENDENT_AMBULATORY_CARE_PROVIDER_SITE_OTHER): Payer: 59 | Admitting: Obstetrics and Gynecology

## 2022-04-18 ENCOUNTER — Encounter: Payer: Self-pay | Admitting: Obstetrics and Gynecology

## 2022-04-18 ENCOUNTER — Other Ambulatory Visit: Payer: Self-pay | Admitting: Internal Medicine

## 2022-04-18 VITALS — BP 110/68 | HR 72 | Ht 62.0 in | Wt 244.0 lb

## 2022-04-18 DIAGNOSIS — R1310 Dysphagia, unspecified: Secondary | ICD-10-CM | POA: Insufficient documentation

## 2022-04-18 DIAGNOSIS — Z113 Encounter for screening for infections with a predominantly sexual mode of transmission: Secondary | ICD-10-CM

## 2022-04-18 DIAGNOSIS — Z01419 Encounter for gynecological examination (general) (routine) without abnormal findings: Secondary | ICD-10-CM

## 2022-04-18 DIAGNOSIS — R143 Flatulence: Secondary | ICD-10-CM | POA: Insufficient documentation

## 2022-04-18 DIAGNOSIS — F418 Other specified anxiety disorders: Secondary | ICD-10-CM

## 2022-04-18 DIAGNOSIS — R1013 Epigastric pain: Secondary | ICD-10-CM | POA: Insufficient documentation

## 2022-04-18 DIAGNOSIS — R141 Gas pain: Secondary | ICD-10-CM | POA: Insufficient documentation

## 2022-04-18 DIAGNOSIS — K589 Irritable bowel syndrome without diarrhea: Secondary | ICD-10-CM | POA: Insufficient documentation

## 2022-04-18 MED ORDER — CITALOPRAM HYDROBROMIDE 10 MG PO TABS: 10.0000 mg | ORAL_TABLET | Freq: Every day | ORAL | 3 refills | Status: DC

## 2022-04-18 MED ORDER — CITALOPRAM HYDROBROMIDE 20 MG PO TABS: ORAL_TABLET | ORAL | 3 refills | Status: DC

## 2022-04-18 NOTE — Patient Instructions (Signed)

## 2022-04-19 LAB — SURESWAB CT/NG/T. VAGINALIS
C. trachomatis RNA, TMA: NOT DETECTED
N. gonorrhoeae RNA, TMA: NOT DETECTED
Trichomonas vaginalis RNA: NOT DETECTED

## 2022-04-19 LAB — HEPATITIS C ANTIBODY: Hepatitis C Ab: NONREACTIVE

## 2022-04-19 LAB — HIV ANTIBODY (ROUTINE TESTING W REFLEX): HIV 1&2 Ab, 4th Generation: NONREACTIVE

## 2022-04-19 LAB — RPR: RPR Ser Ql: NONREACTIVE

## 2022-05-23 ENCOUNTER — Other Ambulatory Visit: Payer: Self-pay | Admitting: Obstetrics and Gynecology

## 2022-05-23 DIAGNOSIS — Z1231 Encounter for screening mammogram for malignant neoplasm of breast: Secondary | ICD-10-CM

## 2022-06-04 ENCOUNTER — Other Ambulatory Visit: Payer: Self-pay | Admitting: Internal Medicine

## 2022-06-22 ENCOUNTER — Encounter: Payer: Self-pay | Admitting: Internal Medicine

## 2022-06-22 ENCOUNTER — Ambulatory Visit (INDEPENDENT_AMBULATORY_CARE_PROVIDER_SITE_OTHER): Payer: 59 | Admitting: Internal Medicine

## 2022-06-22 VITALS — BP 138/82 | HR 91 | Temp 98.1°F | Ht 62.0 in | Wt 243.5 lb

## 2022-06-22 DIAGNOSIS — J4520 Mild intermittent asthma, uncomplicated: Secondary | ICD-10-CM

## 2022-06-22 DIAGNOSIS — K5909 Other constipation: Secondary | ICD-10-CM | POA: Diagnosis not present

## 2022-06-22 DIAGNOSIS — Z0000 Encounter for general adult medical examination without abnormal findings: Secondary | ICD-10-CM

## 2022-06-22 DIAGNOSIS — R7989 Other specified abnormal findings of blood chemistry: Secondary | ICD-10-CM

## 2022-06-22 DIAGNOSIS — F419 Anxiety disorder, unspecified: Secondary | ICD-10-CM

## 2022-06-22 LAB — COMPREHENSIVE METABOLIC PANEL
ALT: 10 U/L (ref 0–35)
AST: 15 U/L (ref 0–37)
Albumin: 4 g/dL (ref 3.5–5.2)
Alkaline Phosphatase: 136 U/L — ABNORMAL HIGH (ref 39–117)
BUN: 13 mg/dL (ref 6–23)
CO2: 31 mEq/L (ref 19–32)
Calcium: 9.3 mg/dL (ref 8.4–10.5)
Chloride: 102 mEq/L (ref 96–112)
Creatinine, Ser: 1.09 mg/dL (ref 0.40–1.20)
GFR: 56.91 mL/min — ABNORMAL LOW (ref >60.00)
Glucose, Bld: 84 mg/dL (ref 70–99)
Potassium: 4.3 mEq/L (ref 3.5–5.1)
Sodium: 138 mEq/L (ref 135–145)
Total Bilirubin: 0.4 mg/dL (ref 0.2–1.2)
Total Protein: 7.1 g/dL (ref 6.0–8.3)

## 2022-06-22 LAB — CBC
HCT: 39 % (ref 36.0–46.0)
Hemoglobin: 12.7 g/dL (ref 12.0–15.0)
MCHC: 32.4 g/dL (ref 30.0–36.0)
MCV: 80.3 fl (ref 78.0–100.0)
Platelets: 355 10*3/uL (ref 150.0–400.0)
RBC: 4.86 Mil/uL (ref 3.87–5.11)
RDW: 14.9 % (ref 11.5–15.5)
WBC: 6.9 10*3/uL (ref 4.0–10.5)

## 2022-06-22 LAB — LIPID PANEL
Cholesterol: 229 mg/dL — ABNORMAL HIGH (ref 0–200)
HDL: 65.3 mg/dL (ref >39.00)
LDL Cholesterol: 136 mg/dL — ABNORMAL HIGH (ref 0–99)
NonHDL: 164.03
Total CHOL/HDL Ratio: 4
Triglycerides: 140 mg/dL (ref 0.0–149.0)
VLDL: 28 mg/dL (ref 0.0–40.0)

## 2022-06-22 LAB — HEMOGLOBIN A1C: Hgb A1c MFr Bld: 5.7 % (ref 4.6–6.5)

## 2022-06-22 MED ORDER — ALPRAZOLAM 0.25 MG PO TABS: 0.2500 mg | ORAL_TABLET | Freq: Every day | ORAL | 1 refills | Status: DC | PRN

## 2022-06-22 MED ORDER — LINACLOTIDE 290 MCG PO CAPS: 290.0000 ug | ORAL_CAPSULE | Freq: Every day | ORAL | 3 refills | Status: DC

## 2022-06-22 MED ORDER — FAMOTIDINE 20 MG PO TABS: 20.0000 mg | ORAL_TABLET | Freq: Two times a day (BID) | ORAL | 3 refills | Status: AC | PRN

## 2022-06-22 NOTE — Progress Notes (Signed)
   Subjective:   Patient ID: Alicia Chen, female    DOB: [birth year], 56 y.o.   MRN: [id number]  HPI The patient is here for physical.  Colonoscopy digestive health [facility]  PMH, [facility], social history reviewed and updated  Review of Systems  Constitutional: Negative.   HENT: Negative.    Eyes: Negative.   Respiratory:  Negative for cough, chest tightness and shortness of breath.   Cardiovascular:  Negative for chest pain, palpitations and leg swelling.  Gastrointestinal:  Negative for abdominal distention, abdominal pain, constipation, diarrhea, nausea and vomiting.  Musculoskeletal: Negative.   Skin: Negative.   Neurological: Negative.   Psychiatric/Behavioral: Negative.      Objective:  Physical Exam Constitutional:      Appearance: She is well-developed. She is obese.  HENT:     Head: Normocephalic and atraumatic.  Cardiovascular:     Rate and Rhythm: Normal rate and regular rhythm.  Pulmonary:     Effort: Pulmonary effort is normal. No respiratory distress.     Breath sounds: Normal breath sounds. No wheezing or rales.  Abdominal:     General: Bowel sounds are normal. There is no distension.     Palpations: Abdomen is soft.     Tenderness: There is no abdominal tenderness. There is no rebound.  Musculoskeletal:     Cervical back: Normal range of motion.  Skin:    General: Skin is warm and dry.  Neurological:     Mental Status: She is alert and oriented to person, place, and time.     Coordination: Coordination normal.     Vitals:   06/22/22 0805  BP: 138/82  Pulse: 91  Temp: 98.1 F (36.7 C)  TempSrc: Oral  SpO2: 99%  Weight: 243 lb 8 oz (110.5 kg)  Height: '5\' 2"'$  (1.575 m)    Assessment & Plan:

## 2022-06-22 NOTE — Patient Instructions (Signed)
We will check the labs today. 

## 2022-06-23 NOTE — Assessment & Plan Note (Signed)
Flu shot up to date. Covid-19 up to date. Shingrix up to date. Tetanus up to date. Colonoscopy up to date. Mammogram up to date, pap smear up to date. Counseled about sun safety and mole surveillance. Counseled about the dangers of distracted driving. Given 10 year screening recommendations.

## 2022-06-23 NOTE — Assessment & Plan Note (Signed)
Refilled xanax 0.25 mg daily prn and she uses rarely. She is in stressful situation currently.

## 2022-06-23 NOTE — Assessment & Plan Note (Signed)
Checking CMP and adjust as needed.  

## 2022-06-23 NOTE — Assessment & Plan Note (Signed)
No flare today. Uses symbicort and albuterol prn only. Uses allergy medication regularly.

## 2022-06-23 NOTE — Assessment & Plan Note (Signed)
Refilled linzess 290 mcg daily. She is not taking daily due to work schedule but will try to take more regularly as this does help.

## 2022-06-23 NOTE — Assessment & Plan Note (Signed)
Checking HgA1c and lipid panel. Counseled about diet and exercise.

## 2022-07-10 ENCOUNTER — Ambulatory Visit
Admission: RE | Admit: 2022-07-10 | Discharge: 2022-07-10 | Disposition: A | Discharge status: Home / Self Care | Payer: 59 | Source: Ambulatory Visit | Attending: Obstetrics and Gynecology | Admitting: Obstetrics and Gynecology

## 2022-07-10 DIAGNOSIS — Z1231 Encounter for screening mammogram for malignant neoplasm of breast: Secondary | ICD-10-CM

## 2022-08-13 ENCOUNTER — Ambulatory Visit
Admission: RE | Admit: 2022-08-13 | Discharge: 2022-08-13 | Disposition: A | Discharge status: Home / Self Care | Payer: 59 | Source: Ambulatory Visit | Attending: Nurse Practitioner | Admitting: Nurse Practitioner

## 2022-08-13 VITALS — BP 122/83 | HR 112 | Temp 101.7°F | Resp 17

## 2022-08-13 DIAGNOSIS — J4520 Mild intermittent asthma, uncomplicated: Secondary | ICD-10-CM

## 2022-08-13 DIAGNOSIS — R509 Fever, unspecified: Secondary | ICD-10-CM

## 2022-08-13 DIAGNOSIS — R6889 Other general symptoms and signs: Secondary | ICD-10-CM

## 2022-08-13 MED ORDER — OSELTAMIVIR PHOSPHATE 75 MG PO CAPS: 75.0000 mg | ORAL_CAPSULE | Freq: Two times a day (BID) | ORAL | 0 refills | Status: DC

## 2022-08-13 MED ORDER — ALBUTEROL SULFATE HFA 108 (90 BASE) MCG/ACT IN AERS: 1.0000 | INHALATION_SPRAY | Freq: Four times a day (QID) | RESPIRATORY_TRACT | 0 refills | Status: DC | PRN

## 2022-08-13 MED ORDER — BENZONATATE 200 MG PO CAPS: 200.0000 mg | ORAL_CAPSULE | Freq: Three times a day (TID) | ORAL | 0 refills | Status: DC | PRN

## 2022-08-13 MED ORDER — ACETAMINOPHEN 325 MG PO TABS
650.0000 mg | ORAL_TABLET | Freq: Once | ORAL | Status: AC
  Administered 2022-08-13: 650 mg via ORAL

## 2022-08-13 NOTE — Discharge Instructions (Signed)
Tamiflu twice daily for 5 days Tessalon as needed for cough Albuterol inhaler as needed Rest and fluids Over-the-counter ibuprofen or Tylenol as needed for fever management Please follow-up with your PCP 2 to 3 days for recheck ER for any worsening symptoms

## 2022-08-13 NOTE — ED Triage Notes (Signed)
Pt presents with productive cough with yellow mucous, headache, and generalized body aches X 2 days.

## 2022-08-13 NOTE — ED Provider Notes (Signed)
[facility]    CSN: [id number] Arrival date & time: 08/13/22  7026      History   Chief Complaint Chief Complaint  Patient presents with   Cough    Cough, congestion in my chest, and feeling tightness in my chest and coughing up phlegm. - Entered by patient   Headache   Generalized Body Aches    HPI Alicia Chen is a 57 y.o. female who presents for evaluation of URI symptoms for 2 days. Patient reports associated symptoms of fever of 101 degrees, cough, congestion, body aches, nausea, wheezing. Denies N/V/D, sore throat, ear pain, shortness of breath.  Patient does have a hx of asthma.  She has an albuterol inhaler and has been using since symptom onset with relief of wheezing.  No smoking. No known sick contacts and no recent travel. Pt is vaccinated for COVID. Pt is vaccinated for flu this season. Pt has taken cough medicine OTC for symptoms. Pt has no other concerns at this time.    Cough Associated symptoms: fever, headaches, myalgias and wheezing   Headache Associated symptoms: congestion, cough, fever and myalgias     Past Medical History:  Diagnosis Date   ALLERGIC RHINITIS    ANEMIA-NOS    ARTHRITIS    ASTHMA    DEPRESSION    Fibroid    GERD    MIGRAINE HEADACHE    OBESITY, MORBID    Vitamin D deficiency     Patient Active Problem List   Diagnosis Date Noted   Dysphagia 04/18/2022   Irritable bowel syndrome 04/18/2022   SOB (shortness of breath) 02/03/2022   Acute bilateral low back pain with left-sided sciatica 06/03/2021   Left hip pain 06/03/2021   Chronic constipation 11/18/2020   Anxiety 02/11/2019   Elevated LFTs 12/04/2016   Routine general medical examination at a health care facility 07/11/2016   S/P laparoscopic sleeve gastrectomy 10/14/2015   Multiple gastric polyps 08/18/2015   Trigger thumb of left hand 04/05/2015   OSA (obstructive sleep apnea) 02/05/2013   Morbid obesity ([place]) 09/26/2010   MIGRAINE HEADACHE 02/09/2010    ALLERGIC RHINITIS 02/09/2010   Asthma 02/09/2010   GERD 02/09/2010   Arthritis 02/09/2010    Past Surgical History:  Procedure Laterality Date   CHOLECYSTECTOMY  2009   ESOPHAGOGASTRODUODENOSCOPY N/A 12/23/2012   Procedure: ESOPHAGOGASTRODUODENOSCOPY (EGD);  Surgeon: Juanita Craver, MD;  Location: [facility];  Service: Endoscopy;  Laterality: N/A;   INTRAUTERINE DEVICE (IUD) INSERTION  12-15-13   Mirena   LAPAROSCOPIC GASTRIC SLEEVE RESECTION  2/17   ROOT CANAL     Skin graft surgery (R) leg     childhood    OB History     Gravida  2   Para  2   Term  1   Preterm  1   AB      Living  2      SAB      IAB      Ectopic      Multiple  2   Live Births  4            Home Medications    Prior to Admission medications   Medication Sig Start Date End Date Taking? Authorizing Provider  albuterol (VENTOLIN HFA) 108 (90 Base) MCG/ACT inhaler Inhale 1-2 puffs into the lungs every 6 (six) hours as needed for wheezing or shortness of breath. 08/13/22  Yes Melynda Ripple, NP  benzonatate (TESSALON) 200 MG capsule Take 1 capsule (  200 mg total) by mouth 3 (three) times daily as needed for cough. 08/13/22  Yes Melynda Ripple, NP  oseltamivir (TAMIFLU) 75 MG capsule Take 1 capsule (75 mg total) by mouth every 12 (twelve) hours. 08/13/22  Yes Melynda Ripple, NP  ALPRAZolam Duanne Moron) 0.25 MG tablet Take 1 tablet (0.25 mg total) by mouth daily as needed for anxiety. 06/22/22   Hoyt Koch, MD  budesonide-formoterol [facility]) 160-4.5 MCG/ACT inhaler INHALE 2 PUFFS INTO THE LUNGS TWICE DAILY 08/11/20   Hoyt Koch, MD  celecoxib (CELEBREX) 100 MG capsule Take 1 capsule (100 mg total) by mouth 2 (two) times daily as needed. 06/07/21   Gregor Hams, MD  Cholecalciferol (VITAMIN D) 2000 units tablet Take 2,000 Units by mouth daily.    [provider]  ciclopirox (LOPROX) 0.77 % cream APPLY TOPICALLY TO THE AFFECTED AREA TWICE DAILY 12/31/19   [provider]   citalopram (CELEXA) 10 MG tablet Take 1 tablet (10 mg total) by mouth daily. Take with the 20 mg of celexa for a total dose of 30 mg 04/18/22   Salvadore Dom, MD  citalopram (CELEXA) 20 MG tablet Take 1 tablet a day. Take with the 10 mg of Celexa for a total dose of 30 mg a day 04/18/22   Salvadore Dom, MD  famotidine (PEPCID) 20 MG tablet Take 1 tablet (20 mg total) by mouth 2 (two) times daily as needed for heartburn or indigestion. 06/22/22   Hoyt Koch, MD  fexofenadine (ALLEGRA) 180 MG tablet Take 1 tablet (180 mg total) by mouth daily. 10/30/21 01/28/22  Lynden Oxford Scales, PA-C  fluticasone (FLONASE) 50 MCG/ACT nasal spray Place 1 spray into both nostrils daily. Begin by using 2 sprays in each nare daily for 3 to 5 days, then decrease to 1 spray in each nare daily. 10/30/21   Lynden Oxford Scales, PA-C  ipratropium (ATROVENT) 0.06 % nasal spray Place 2 sprays into both nostrils 4 (four) times daily. As needed for nasal congestion, runny nose 10/30/21   Lynden Oxford Scales, PA-C  linaclotide [facility]) 290 MCG CAPS capsule Take 1 capsule (290 mcg total) by mouth daily before breakfast. 06/22/22   Hoyt Koch, MD  meclizine (ANTIVERT) 25 MG tablet Take 1 tablet (25 mg total) by mouth 3 (three) times daily as needed for dizziness. 09/28/21   Francene Finders, PA-C  Multiple Vitamin (MULTIVITAMIN) tablet Take 1 tablet by mouth daily.    [provider]  omeprazole (PRILOSEC) 20 MG capsule TAKE 1 CAPSULE(20 MG) BY MOUTH DAILY 01/17/22   Hoyt Koch, MD  SUMAtriptan (IMITREX) 50 MG tablet Take 1 tablet (50 mg total) by mouth every 2 (two) hours as needed for migraine. May repeat in 2 hours if headache persists or recurs. 12/25/19   Hoyt Koch, MD    Family History Family History  Problem Relation Age of Onset   Arthritis Mother    Arthritis Father    Alcohol abuse Other        Parents   Arthritis Other        grandparents    Diabetes Other        Parent & grandparent   Hyperlipidemia Other        Parent, grandparent, other relative   Hypertension Other        parent, grandparent, other relative   Prostate cancer Other        grandfather   Breast cancer Sister  Breast cancer Sister    Breast cancer Paternal Aunt     Social History Social History   Tobacco Use   Smoking status: Never   Smokeless tobacco: Never  Vaping Use   Vaping Use: Never used  Substance Use Topics   Alcohol use: Yes    Comment: Social   Drug use: No     Allergies   Penicillins   Review of Systems Review of Systems  Constitutional:  Positive for fever.  HENT:  Positive for congestion.   Respiratory:  Positive for cough and wheezing.   Musculoskeletal:  Positive for myalgias.  Neurological:  Positive for headaches.     Physical Exam Triage Vital Signs ED Triage Vitals  Enc Vitals Group     BP 08/13/22 1006 122/83     Pulse Rate 08/13/22 1006 (!) 112     Resp 08/13/22 1006 17     Temp 08/13/22 1006 (!) 101.7 F (38.7 C)     Temp Source 08/13/22 1006 Oral     SpO2 08/13/22 1006 94 %     Weight --      Height --      Head Circumference --      Peak Flow --      Pain Score 08/13/22 1005 6     Pain Loc --      Pain Edu? --      Excl. in [place]? --    No data found.  Updated Vital Signs BP 122/83 (BP Location: Right Arm)   Pulse (!) 112   Temp (!) 101.7 F (38.7 C) (Oral)   Resp 17   LMP 01/18/2014   SpO2 94%   Visual Acuity Right Eye Distance:   Left Eye Distance:   Bilateral Distance:    Right Eye Near:   Left Eye Near:    Bilateral Near:     Physical Exam Vitals and nursing note reviewed.  Constitutional:      General: She is not in acute distress.    Appearance: She is well-developed. She is not ill-appearing.  HENT:     Head: Normocephalic and atraumatic.     Right Ear: Tympanic membrane and ear canal normal.     Left Ear: Tympanic membrane and ear canal normal.     Nose: Congestion  present.     Mouth/Throat:     Mouth: Mucous membranes are moist.     Pharynx: Oropharynx is clear. Uvula midline. Posterior oropharyngeal erythema present.     Tonsils: No tonsillar exudate or tonsillar abscesses.  Eyes:     Conjunctiva/sclera: Conjunctivae normal.     Pupils: Pupils are equal, round, and reactive to light.  Cardiovascular:     Rate and Rhythm: Normal rate and regular rhythm.     Heart sounds: Normal heart sounds.  Pulmonary:     Effort: Pulmonary effort is normal.     Breath sounds: Normal breath sounds.  Musculoskeletal:     Cervical back: Normal range of motion and neck supple.  Lymphadenopathy:     Cervical: No cervical adenopathy.  Skin:    General: Skin is warm and dry.  Neurological:     General: No focal deficit present.     Mental Status: She is alert and oriented to person, place, and time.  Psychiatric:        Mood and Affect: Mood normal.        Behavior: Behavior normal.      UC Treatments / Results  Labs (all  labs ordered are listed, but only abnormal results are displayed) Labs Reviewed - No data to display  EKG   Radiology No results found.  Procedures Procedures (including critical care time)  Medications Ordered in UC Medications  acetaminophen (TYLENOL) tablet 650 mg (650 mg Oral Given 08/13/22 1051)    Initial Impression / Assessment and Plan / UC Course  I have reviewed the triage vital signs and the nursing notes.  Pertinent labs & imaging results that were available during my care of the patient were reviewed by me and considered in my medical decision making (see chart for details).     Reviewed exam and symptoms with patient.  Discussed flulike symptoms Start Tamiflu Tylenol given in clinic for fever Refilled albuterol inhaler Tessalon as needed cough Rest and fluids Follow-up with PCP 2 to 3 days for recheck ER precautions reviewed and patient verbalized understanding Final Clinical Impressions(s) / UC Diagnoses    Final diagnoses:  Fever, unspecified  Flu-like symptoms  Mild intermittent asthma, unspecified whether complicated     Discharge Instructions      Tamiflu twice daily for 5 days Tessalon as needed for cough Albuterol inhaler as needed Rest and fluids Over-the-counter ibuprofen or Tylenol as needed for fever management Please follow-up with your PCP 2 to 3 days for recheck ER for any worsening symptoms   ED Prescriptions     Medication Sig Dispense Auth. Provider   oseltamivir (TAMIFLU) 75 MG capsule Take 1 capsule (75 mg total) by mouth every 12 (twelve) hours. 10 capsule Melynda Ripple, NP   benzonatate (TESSALON) 200 MG capsule Take 1 capsule (200 mg total) by mouth 3 (three) times daily as needed for cough. 20 capsule Melynda Ripple, NP   albuterol (VENTOLIN HFA) 108 (90 Base) MCG/ACT inhaler Inhale 1-2 puffs into the lungs every 6 (six) hours as needed for wheezing or shortness of breath. 1 each Melynda Ripple, NP      PDMP not reviewed this encounter.   Melynda Ripple, NP 08/13/22 1054

## 2022-08-16 ENCOUNTER — Ambulatory Visit (INDEPENDENT_AMBULATORY_CARE_PROVIDER_SITE_OTHER): Payer: 59

## 2022-08-16 ENCOUNTER — Ambulatory Visit
Admission: RE | Admit: 2022-08-16 | Discharge: 2022-08-16 | Disposition: A | Discharge status: Home / Self Care | Payer: 59 | Source: Ambulatory Visit | Attending: Physician Assistant | Admitting: Physician Assistant

## 2022-08-16 VITALS — BP 121/79 | HR 87 | Temp 98.0°F | Resp 15

## 2022-08-16 DIAGNOSIS — J111 Influenza due to unidentified influenza virus with other respiratory manifestations: Secondary | ICD-10-CM

## 2022-08-16 DIAGNOSIS — J45901 Unspecified asthma with (acute) exacerbation: Secondary | ICD-10-CM

## 2022-08-16 DIAGNOSIS — R059 Cough, unspecified: Secondary | ICD-10-CM | POA: Diagnosis not present

## 2022-08-16 MED ORDER — PROMETHAZINE-DM 6.25-15 MG/5ML PO SYRP: 5.0000 mL | ORAL_SOLUTION | Freq: Four times a day (QID) | ORAL | 0 refills | Status: DC | PRN

## 2022-08-16 MED ORDER — PREDNISONE 20 MG PO TABS: 40.0000 mg | ORAL_TABLET | Freq: Every day | ORAL | 0 refills | Status: AC

## 2022-08-16 NOTE — ED Triage Notes (Signed)
Pt presents for follow up after being diagnosed with flu X 4 days ago; pt states she is taking prescribed medications and is not feeling any relief with symptoms.

## 2022-08-16 NOTE — ED Provider Notes (Signed)
[facility]    CSN: [id number] Arrival date & time: 08/16/22  1145      History   Chief Complaint Chief Complaint  Patient presents with   Follow-up    Was seen on Sunday and treated for flu symptoms but still feeling bad - Entered by patient    HPI Alicia Chen is a 57 y.o. female.   Patient here today for evaluation of continued cough and chest tightness after being diagnosed with the flu 4 days ago. She reports that she has not had fever. She has been using her inhaler for her asthma with mild relief.   The history is provided by the patient.    Past Medical History:  Diagnosis Date   ALLERGIC RHINITIS    ANEMIA-NOS    ARTHRITIS    ASTHMA    DEPRESSION    Fibroid    GERD    MIGRAINE HEADACHE    OBESITY, MORBID    Vitamin D deficiency     Patient Active Problem List   Diagnosis Date Noted   Dysphagia 04/18/2022   Irritable bowel syndrome 04/18/2022   SOB (shortness of breath) 02/03/2022   Acute bilateral low back pain with left-sided sciatica 06/03/2021   Left hip pain 06/03/2021   Chronic constipation 11/18/2020   Anxiety 02/11/2019   Elevated LFTs 12/04/2016   Routine general medical examination at a health care facility 07/11/2016   S/P laparoscopic sleeve gastrectomy 10/14/2015   Multiple gastric polyps 08/18/2015   Trigger thumb of left hand 04/05/2015   OSA (obstructive sleep apnea) 02/05/2013   Morbid obesity ([place]) 09/26/2010   MIGRAINE HEADACHE 02/09/2010   ALLERGIC RHINITIS 02/09/2010   Asthma 02/09/2010   GERD 02/09/2010   Arthritis 02/09/2010    Past Surgical History:  Procedure Laterality Date   CHOLECYSTECTOMY  2009   ESOPHAGOGASTRODUODENOSCOPY N/A 12/23/2012   Procedure: ESOPHAGOGASTRODUODENOSCOPY (EGD);  Surgeon: Juanita Craver, MD;  Location: [facility];  Service: Endoscopy;  Laterality: N/A;   INTRAUTERINE DEVICE (IUD) INSERTION  12-15-13   Mirena   LAPAROSCOPIC GASTRIC SLEEVE RESECTION  2/17   ROOT CANAL     Skin  graft surgery (R) leg     childhood    OB History     Gravida  2   Para  2   Term  1   Preterm  1   AB      Living  2      SAB      IAB      Ectopic      Multiple  2   Live Births  4            Home Medications    Prior to Admission medications   Medication Sig Start Date End Date Taking? Authorizing Provider  predniSONE (DELTASONE) 20 MG tablet Take 2 tablets (40 mg total) by mouth daily with breakfast for 5 days. 08/16/22 08/21/22 Yes Francene Finders, PA-C  promethazine-dextromethorphan (PROMETHAZINE-DM) 6.25-15 MG/5ML syrup Take 5 mLs by mouth 4 (four) times daily as needed for cough. 08/16/22  Yes Francene Finders, PA-C  albuterol (VENTOLIN HFA) 108 (90 Base) MCG/ACT inhaler Inhale 1-2 puffs into the lungs every 6 (six) hours as needed for wheezing or shortness of breath. 08/13/22   Melynda Ripple, NP  ALPRAZolam Duanne Moron) 0.25 MG tablet Take 1 tablet (0.25 mg total) by mouth daily as needed for anxiety. 06/22/22   Hoyt Koch, MD  benzonatate (TESSALON) 200 MG capsule Take 1 capsule (  200 mg total) by mouth 3 (three) times daily as needed for cough. 08/13/22   Melynda Ripple, NP  budesonide-formoterol [facility]) 160-4.5 MCG/ACT inhaler INHALE 2 PUFFS INTO THE LUNGS TWICE DAILY 08/11/20   Hoyt Koch, MD  celecoxib (CELEBREX) 100 MG capsule Take 1 capsule (100 mg total) by mouth 2 (two) times daily as needed. 06/07/21   Gregor Hams, MD  Cholecalciferol (VITAMIN D) 2000 units tablet Take 2,000 Units by mouth daily.    [provider]  ciclopirox (LOPROX) 0.77 % cream APPLY TOPICALLY TO THE AFFECTED AREA TWICE DAILY 12/31/19   [provider]  citalopram (CELEXA) 10 MG tablet Take 1 tablet (10 mg total) by mouth daily. Take with the 20 mg of celexa for a total dose of 30 mg 04/18/22   Salvadore Dom, MD  citalopram (CELEXA) 20 MG tablet Take 1 tablet a day. Take with the 10 mg of Celexa for a total dose of 30 mg a day 04/18/22    Salvadore Dom, MD  famotidine (PEPCID) 20 MG tablet Take 1 tablet (20 mg total) by mouth 2 (two) times daily as needed for heartburn or indigestion. 06/22/22   Hoyt Koch, MD  fexofenadine (ALLEGRA) 180 MG tablet Take 1 tablet (180 mg total) by mouth daily. 10/30/21 01/28/22  Lynden Oxford Scales, PA-C  fluticasone (FLONASE) 50 MCG/ACT nasal spray Place 1 spray into both nostrils daily. Begin by using 2 sprays in each nare daily for 3 to 5 days, then decrease to 1 spray in each nare daily. 10/30/21   Lynden Oxford Scales, PA-C  ipratropium (ATROVENT) 0.06 % nasal spray Place 2 sprays into both nostrils 4 (four) times daily. As needed for nasal congestion, runny nose 10/30/21   Lynden Oxford Scales, PA-C  linaclotide [facility]) 290 MCG CAPS capsule Take 1 capsule (290 mcg total) by mouth daily before breakfast. 06/22/22   Hoyt Koch, MD  meclizine (ANTIVERT) 25 MG tablet Take 1 tablet (25 mg total) by mouth 3 (three) times daily as needed for dizziness. 09/28/21   Francene Finders, PA-C  Multiple Vitamin (MULTIVITAMIN) tablet Take 1 tablet by mouth daily.    [provider]  omeprazole (PRILOSEC) 20 MG capsule TAKE 1 CAPSULE(20 MG) BY MOUTH DAILY 01/17/22   Hoyt Koch, MD  oseltamivir (TAMIFLU) 75 MG capsule Take 1 capsule (75 mg total) by mouth every 12 (twelve) hours. 08/13/22   Melynda Ripple, NP  SUMAtriptan (IMITREX) 50 MG tablet Take 1 tablet (50 mg total) by mouth every 2 (two) hours as needed for migraine. May repeat in 2 hours if headache persists or recurs. 12/25/19   Hoyt Koch, MD    Family History Family History  Problem Relation Age of Onset   Arthritis Mother    Arthritis Father    Alcohol abuse Other        Parents   Arthritis Other        grandparents   Diabetes Other        Parent & grandparent   Hyperlipidemia Other        Parent, grandparent, other relative   Hypertension Other        parent, grandparent, other  relative   Prostate cancer Other        grandfather   Breast cancer Sister    Breast cancer Sister    Breast cancer Paternal Aunt     Social History Social History   Tobacco Use   Smoking  status: Never   Smokeless tobacco: Never  Vaping Use   Vaping Use: Never used  Substance Use Topics   Alcohol use: Yes    Comment: Social   Drug use: No     Allergies   Penicillins   Review of Systems Review of Systems  Constitutional:  Negative for chills and fever.  HENT:  Positive for congestion. Negative for ear pain and sore throat.   Eyes:  Negative for discharge and redness.  Respiratory:  Positive for cough and wheezing. Negative for shortness of breath.   Gastrointestinal:  Negative for diarrhea, nausea and vomiting.     Physical Exam Triage Vital Signs ED Triage Vitals  Enc Vitals Group     BP 08/16/22 1245 121/79     Pulse Rate 08/16/22 1245 87     Resp 08/16/22 1245 15     Temp 08/16/22 1245 98 F (36.7 C)     Temp Source 08/16/22 1245 Oral     SpO2 08/16/22 1245 96 %     Weight --      Height --      Head Circumference --      Peak Flow --      Pain Score 08/16/22 1148 6     Pain Loc --      Pain Edu? --      Excl. in [place]? --    No data found.  Updated Vital Signs BP 121/79 (BP Location: Left Arm)   Pulse 87   Temp 98 F (36.7 C) (Oral)   Resp 15   LMP 01/18/2014   SpO2 96%      Physical Exam Vitals and nursing note reviewed.  Constitutional:      General: She is not in acute distress.    Appearance: Normal appearance. She is not ill-appearing.  HENT:     Head: Normocephalic and atraumatic.     Nose: Congestion present.     Mouth/Throat:     Mouth: Mucous membranes are moist.     Pharynx: No oropharyngeal exudate or posterior oropharyngeal erythema.  Eyes:     Conjunctiva/sclera: Conjunctivae normal.  Cardiovascular:     Rate and Rhythm: Normal rate and regular rhythm.     Heart sounds: Normal heart sounds. No murmur heard. Pulmonary:      Effort: Pulmonary effort is normal. No respiratory distress.     Breath sounds: Normal breath sounds. No wheezing, rhonchi or rales.  Skin:    General: Skin is warm and dry.  Neurological:     Mental Status: She is alert.  Psychiatric:        Mood and Affect: Mood normal.        Thought Content: Thought content normal.      UC Treatments / Results  Labs (all labs ordered are listed, but only abnormal results are displayed) Labs Reviewed - No data to display  EKG   Radiology DG Chest 2 View  Result Date: 08/16/2022 CLINICAL DATA:  Cough EXAM: CHEST - 2 VIEW COMPARISON:  Chest 11/09/2016 FINDINGS: The heart size and mediastinal contours are within normal limits. Both lungs are clear. The visualized skeletal structures are unremarkable. IMPRESSION: No active cardiopulmonary disease. Electronically Signed   By: Franchot Gallo M.D.   On: 08/16/2022 12:57    Procedures Procedures (including critical care time)  Medications Ordered in UC Medications - No data to display  Initial Impression / Assessment and Plan / UC Course  I have reviewed the triage vital signs  and the nursing notes.  Pertinent labs & imaging results that were available during my care of the patient were reviewed by me and considered in my medical decision making (see chart for details).     CXR clear. Will treat to cover possible asthma exacerbation with steroid burst and cough syrup prescribed at patient's request. Encouraged follow up with any further concerns or if symptoms fail to improve or worsen.    Final Clinical Impressions(s) / UC Diagnoses   Final diagnoses:  Influenza with respiratory manifestation  Asthma with acute exacerbation, unspecified asthma severity, unspecified whether persistent   Discharge Instructions   None    ED Prescriptions     Medication Sig Dispense Auth. Provider   predniSONE (DELTASONE) 20 MG tablet Take 2 tablets (40 mg total) by mouth daily with breakfast for 5  days. 10 tablet Francene Finders, PA-C   promethazine-dextromethorphan (PROMETHAZINE-DM) 6.25-15 MG/5ML syrup Take 5 mLs by mouth 4 (four) times daily as needed for cough. 118 mL Francene Finders, PA-C      PDMP not reviewed this encounter.   Francene Finders, PA-C 08/16/22 1318

## 2022-08-17 ENCOUNTER — Telehealth: Payer: 59 | Admitting: Internal Medicine

## 2022-08-18 ENCOUNTER — Ambulatory Visit: Payer: 59 | Admitting: Family Medicine

## 2022-08-24 NOTE — Progress Notes (Deleted)
   I, Alicia Chen, LAT, ATC acting as a scribe for Alicia Leader, MD.  [name] is a 57 y.o. female who presents to [name] at [facility] today for neck pain. Pt was previously seen by Dr. Georgina Snell on 03/08/22 for R knee pain. Today, pt c/o neck pain x /. Pt locates pain to   Radiates:  UE Numbness/tingling: UE Weakness: Aggravates: Treatments tried:   Pertinent review of systems: ***  Relevant historical information: ***   Exam:  LMP 01/18/2014  General: Well Developed, well nourished, and in no acute distress.   MSK: ***    Lab and Radiology Results No results found for this or any previous visit (from the past 72 hour(s)). No results found.     Assessment and Plan: 56 y.o. female with ***   PDMP not reviewed this encounter. No orders of the defined types were placed in this encounter.  No orders of the defined types were placed in this encounter.    Discussed warning signs or symptoms. Please see discharge instructions. Patient expresses understanding.   ***

## 2022-08-25 ENCOUNTER — Ambulatory Visit: Payer: 59 | Admitting: Family Medicine

## 2022-08-31 ENCOUNTER — Telehealth: Payer: 59 | Admitting: Physician Assistant

## 2022-08-31 DIAGNOSIS — J019 Acute sinusitis, unspecified: Secondary | ICD-10-CM

## 2022-08-31 DIAGNOSIS — B9689 Other specified bacterial agents as the cause of diseases classified elsewhere: Secondary | ICD-10-CM

## 2022-08-31 MED ORDER — DOXYCYCLINE HYCLATE 100 MG PO TABS: 100.0000 mg | ORAL_TABLET | Freq: Two times a day (BID) | ORAL | 0 refills | Status: DC

## 2022-08-31 NOTE — Progress Notes (Signed)

## 2022-08-31 NOTE — Progress Notes (Signed)
I have spent 5 minutes in review of e-visit questionnaire, review and updating patient chart, medical decision making and response to patient.   Chett Taniguchi Cody Saint Hank, PA-C    

## 2022-09-13 NOTE — Progress Notes (Signed)
I, Alicia Chen, CMA acting as a scribe for Alicia Leader, MD.  Alicia Chen is a 57 y.o. female who presents to [place] at [facility] today for neck pain. Pt was previously seen by Dr. Georgina Chen on 03/08/22 for R knee pain.   Today, pt c/o continued numbness in BILAT UE and LE.    Pt locates pain to down the arm into the hands.  Radiates: BILAT arms/hands UE Numbness/tingling: yes UE Weakness: Aggravates: flares up when using cell phone or driving for long periods of time.  Treatments tried:  Pt also c/o numbness in her leg. Sx worse when driving and also when holding phone.  Radiates: into the glute and leg, has to shake the leg out after prolonged time sitting.  LE Numbness/tingling: yes LE Weakness:  Aggravates: driving 2.5 hours every week to stay with her mother, her mother currently is not doing well and requires 24/7 care.  Treatments tried: stopping to take breaks during the drive has been somewhat helpful.   Also c/o continued chest congestion since having the Flu in early Jan 2024, seeing PCP Dr. Sharlet Chen for this today.   Dx imaging: 06/03/21 L-spine XR  Pertinent review of systems: No fevers or chills  Relevant historical information: Patient reports a remote history of carpal tunnel syndrome in the past.   Exam:  BP 118/82   Pulse 82   Ht 5' 2"$  (1.575 m)   Wt 243 lb (110.2 kg)   LMP 01/18/2014   SpO2 100%   BMI 44.45 kg/m  General: Well Developed, well nourished, and in no acute distress.   MSK: L-spine: Normal appearing Nontender to palpation spinal midline. Decreased lumbar motion. Lower extremity strength is intact. Reflexes are intact. Positive right-sided slump test.  C-spine: Normal appearing Nontender palpation cervical midline. Decreased cervical motion. Upper extremity strength is intact throughout. Reflexes are intact throughout. Negative Spurling's test.  Right wrist: Normal-appearing Nontender palpation. Positive  Tinel's at carpal tunnel. Positive Phalen's test.  Left wrist: Normal-appearing. Nontender to palpation. Positive Tinel's at carpal tunnel. Positive Phalen's test.  Cap refill and pulses are intact distally.  Lab and Radiology Results   EXAM: LUMBAR SPINE - COMPLETE 4+ VIEW   COMPARISON:  None.   FINDINGS: Subtle curvature of the lumbar spine convex left. Vertebral body heights are maintained. Mild spondylosis throughout the lumbar spine to include facet arthropathy. Subtle disc space narrowing at the L4-5 level. No acute compression fracture or spondylolisthesis/spondylolysis.   IMPRESSION: 1. No acute findings. 2. Mild spondylosis of the lumbar spine with mild disc disease at the L4-5 level.     Electronically Signed   By: Marin Olp M.D.   On: 06/05/2021 12:28 I, Alicia Chen, personally (independently) visualized and performed the interpretation of the images attached in this note.      Assessment and Plan: 57 y.o. female with right lumbar radiculopathy in an L5 dermatomal pattern.  This seems to be worsened after doing a lot of driving to [place] to provide care for her mother.  We discussed options.  Plan for trial of prednisone and gabapentin.  If worsening or not improving next step should be MRI lumbar spine.  She has had episodes of back pain and lumbar radiculopathy in the past so this is an acute recurrence of a chronic problem.  Bilateral upper extremity paresthesias into the hands.  This is thought to be carpal tunnel syndrome and not cervical radiculopathy.  Right is worse than left.  Plan for night splints for carpal tunnel syndrome.  Gabapentin trial at bedtime.  The prednisone prescribed for lumbar radiculopathy may be helpful as well.  If not better consider steroid injection.   PDMP not reviewed this encounter. No orders of the defined types were placed in this encounter.  Meds ordered this encounter  Medications   gabapentin  (NEURONTIN) 300 MG capsule    Sig: Take 1 capsule (300 mg total) by mouth 3 (three) times daily as needed.    Dispense:  90 capsule    Refill:  3   predniSONE (DELTASONE) 50 MG tablet    Sig: Take 1 tablet (50 mg total) by mouth daily.    Dispense:  5 tablet    Refill:  0     Discussed warning signs or symptoms. Please see discharge instructions. Patient expresses understanding.   The above documentation has been reviewed and is accurate and complete Alicia Chen, M.D.

## 2022-09-15 ENCOUNTER — Encounter: Payer: Self-pay | Admitting: Internal Medicine

## 2022-09-15 ENCOUNTER — Ambulatory Visit: Payer: 59 | Admitting: Family Medicine

## 2022-09-15 ENCOUNTER — Ambulatory Visit: Payer: 59 | Admitting: Internal Medicine

## 2022-09-15 VITALS — BP 118/82 | HR 82 | Ht 62.0 in | Wt 243.0 lb

## 2022-09-15 VITALS — BP 118/60 | HR 86 | Temp 98.2°F | Ht 62.0 in | Wt 242.0 lb

## 2022-09-15 DIAGNOSIS — G5603 Carpal tunnel syndrome, bilateral upper limbs: Secondary | ICD-10-CM

## 2022-09-15 DIAGNOSIS — R058 Other specified cough: Secondary | ICD-10-CM | POA: Insufficient documentation

## 2022-09-15 DIAGNOSIS — M5416 Radiculopathy, lumbar region: Secondary | ICD-10-CM

## 2022-09-15 MED ORDER — PREDNISONE 50 MG PO TABS: 50.0000 mg | ORAL_TABLET | Freq: Every day | ORAL | 0 refills | Status: DC

## 2022-09-15 MED ORDER — GABAPENTIN 300 MG PO CAPS: 300.0000 mg | ORAL_CAPSULE | Freq: Three times a day (TID) | ORAL | 3 refills | Status: DC | PRN

## 2022-09-15 NOTE — Patient Instructions (Signed)
I think the prednisone will help clear the cough. It will improve likely in the next 1-2 weeks.

## 2022-09-15 NOTE — Assessment & Plan Note (Signed)
Suspect cough for 3-4 weeks is post-viral and normal. Overall is gradually improving. She is prescribed steroids for her back and these will likely help as well. No other medication is required.She already has cough medicine and sleeping well through night without waking due to cough. No clear asthma flare or signs of bacterial infection. Reassurance given.

## 2022-09-15 NOTE — Patient Instructions (Addendum)
Thank you for coming in today.   Use carpal tunnel wrist braces at bed.   Take that prednisone for 5 days.   Use the gabapentin for nerve pain as needed.   If the leg pain does not resolve let me know and I will move to MRI.   If the hand numbness does not get better next step is typically a carpal tunnel injection.

## 2022-09-15 NOTE — Progress Notes (Signed)
   Subjective:   Patient ID: Alicia Chen, female    DOB: 1966-04-20, 57 y.o.   MRN: [id number]  HPI The patient is a 57 YO female coming in for persistent cough after viral illness. Overall mildly improving  Review of Systems  Constitutional: Negative.   HENT: Negative.    Eyes: Negative.   Respiratory:  Positive for cough and shortness of breath. Negative for chest tightness.   Cardiovascular:  Negative for chest pain, palpitations and leg swelling.  Gastrointestinal:  Negative for abdominal distention, abdominal pain, constipation, diarrhea, nausea and vomiting.  Musculoskeletal: Negative.   Skin: Negative.   Neurological: Negative.   Psychiatric/Behavioral: Negative.      Objective:  Physical Exam Constitutional:      Appearance: She is well-developed. She is obese.  HENT:     Head: Normocephalic and atraumatic.  Cardiovascular:     Rate and Rhythm: Normal rate and regular rhythm.  Pulmonary:     Effort: Pulmonary effort is normal. No respiratory distress.     Breath sounds: Normal breath sounds. No wheezing or rales.  Abdominal:     General: Bowel sounds are normal. There is no distension.     Palpations: Abdomen is soft.     Tenderness: There is no abdominal tenderness. There is no rebound.  Musculoskeletal:     Cervical back: Normal range of motion.  Skin:    General: Skin is warm and dry.  Neurological:     Mental Status: She is alert and oriented to person, place, and time.     Coordination: Coordination normal.     Vitals:   09/15/22 0902  BP: 118/60  Pulse: 86  Temp: 98.2 F (36.8 C)  TempSrc: Oral  SpO2: 98%  Weight: 242 lb (109.8 kg)  Height: '5\' 2"'$  (1.575 m)    Assessment & Plan:  Visit time 15 minutes in face to face communication with patient and coordination of care, additional 5 minutes spent in record review, coordination or care, ordering tests, communicating/referring to other healthcare professionals, documenting in medical records all on  the same day of the visit for total time 20 minutes spent on the visit.

## 2022-12-12 ENCOUNTER — Encounter: Payer: Self-pay | Admitting: Physician Assistant

## 2022-12-12 ENCOUNTER — Telehealth: Payer: 59 | Admitting: Physician Assistant

## 2022-12-12 DIAGNOSIS — B9689 Other specified bacterial agents as the cause of diseases classified elsewhere: Secondary | ICD-10-CM | POA: Diagnosis not present

## 2022-12-12 DIAGNOSIS — J019 Acute sinusitis, unspecified: Secondary | ICD-10-CM

## 2022-12-12 MED ORDER — DOXYCYCLINE HYCLATE 100 MG PO TABS
100.0000 mg | ORAL_TABLET | Freq: Two times a day (BID) | ORAL | 0 refills | Status: DC
Start: 2022-12-12 — End: 2024-05-02

## 2022-12-12 MED ORDER — BENZONATATE 100 MG PO CAPS
100.0000 mg | ORAL_CAPSULE | Freq: Three times a day (TID) | ORAL | 0 refills | Status: DC | PRN
Start: 2022-12-12 — End: 2024-05-02

## 2022-12-12 NOTE — Patient Instructions (Signed)
Clarisse Gouge, thank you for joining [name], PA-C for today's virtual visit.  While this provider is not your primary care provider (PCP), if your PCP is located in our provider database this encounter information will be shared with them immediately following your visit.   A [facility] MyChart account gives you access to today's visit and all your visits, tests, and labs performed at [facility] " click here if you don't have a [facility] MyChart account or go to [email or web address]  Consent: (Patient) Alicia Chen provided verbal consent for this virtual visit at the beginning of the encounter.  Current Medications:  Current Outpatient Medications:    albuterol (VENTOLIN HFA) 108 (90 Base) MCG/ACT inhaler, Inhale 1-2 puffs into the lungs every 6 (six) hours as needed for wheezing or shortness of breath., Disp: 1 each, Rfl: 0   ALPRAZolam (XANAX) 0.25 MG tablet, Take 1 tablet (0.25 mg total) by mouth daily as needed for anxiety., Disp: 30 tablet, Rfl: 1   benzonatate (TESSALON) 200 MG capsule, Take 1 capsule (200 mg total) by mouth 3 (three) times daily as needed for cough., Disp: 20 capsule, Rfl: 0   budesonide-formoterol (SYMBICORT) 160-4.5 MCG/ACT inhaler, INHALE 2 PUFFS INTO THE LUNGS TWICE DAILY, Disp: 10.2 g, Rfl: 2   celecoxib (CELEBREX) 100 MG capsule, Take 1 capsule (100 mg total) by mouth 2 (two) times daily as needed., Disp: 60 capsule, Rfl: 1   cetirizine (ZYRTEC) 10 MG tablet, Take 10 mg by mouth daily., Disp: , Rfl:    Cholecalciferol (VITAMIN D) 2000 units tablet, Take 2,000 Units by mouth daily., Disp: , Rfl:    ciclopirox (LOPROX) 0.77 % cream, APPLY TOPICALLY TO THE AFFECTED AREA TWICE DAILY, Disp: , Rfl:    citalopram (CELEXA) 10 MG tablet, Take 1 tablet (10 mg total) by mouth daily. Take with the 20 mg of celexa for a total dose of 30 mg, Disp: 90 tablet, Rfl: 3   citalopram (CELEXA) 20 MG tablet, Take 1 tablet a day. Take with the 10 mg of  Celexa for a total dose of 30 mg a day, Disp: 90 tablet, Rfl: 3   doxycycline (VIBRA-TABS) 100 MG tablet, Take 1 tablet (100 mg total) by mouth 2 (two) times daily., Disp: 20 tablet, Rfl: 0   famotidine (PEPCID) 20 MG tablet, Take 1 tablet (20 mg total) by mouth 2 (two) times daily as needed for heartburn or indigestion., Disp: 180 tablet, Rfl: 3   fexofenadine (ALLEGRA) 180 MG tablet, Take 1 tablet (180 mg total) by mouth daily., Disp: 90 tablet, Rfl: 0   fluticasone (FLONASE) 50 MCG/ACT nasal spray, Place 1 spray into both nostrils daily. Begin by using 2 sprays in each nare daily for 3 to 5 days, then decrease to 1 spray in each nare daily., Disp: 32 mL, Rfl: 1   gabapentin (NEURONTIN) 300 MG capsule, Take 1 capsule (300 mg total) by mouth 3 (three) times daily as needed., Disp: 90 capsule, Rfl: 3   ipratropium (ATROVENT) 0.06 % nasal spray, Place 2 sprays into both nostrils 4 (four) times daily. As needed for nasal congestion, runny nose, Disp: 15 mL, Rfl: 0   linaclotide (LINZESS) 290 MCG CAPS capsule, Take 1 capsule (290 mcg total) by mouth daily before breakfast., Disp: 90 capsule, Rfl: 3   meclizine (ANTIVERT) 25 MG tablet, Take 1 tablet (25 mg total) by mouth 3 (three) times daily as needed for dizziness., Disp: 30 tablet, Rfl: 0   Multiple Vitamin (MULTIVITAMIN)  tablet, Take 1 tablet by mouth daily., Disp: , Rfl:    omeprazole (PRILOSEC) 20 MG capsule, TAKE 1 CAPSULE(20 MG) BY MOUTH DAILY, Disp: 90 capsule, Rfl: 3   phentermine (ADIPEX-P) 37.5 MG tablet, Take 37.5 mg by mouth every morning., Disp: , Rfl:    promethazine-dextromethorphan (PROMETHAZINE-DM) 6.25-15 MG/5ML syrup, Take 5 mLs by mouth 4 (four) times daily as needed for cough., Disp: 118 mL, Rfl: 0   SUMAtriptan (IMITREX) 50 MG tablet, Take 1 tablet (50 mg total) by mouth every 2 (two) hours as needed for migraine. May repeat in 2 hours if headache persists or recurs., Disp: 10 tablet, Rfl: 5   Medications ordered in this  encounter:  No orders of the defined types were placed in this encounter.    *If you need refills on other medications prior to your next appointment, please contact your pharmacy*  Follow-Up: Call back or seek an in-person evaluation if the symptoms worsen or if the condition fails to improve as anticipated.  [facility] [phone]  Other Instructions COVID test as discussed and message me with the results. IF positive will make changes to treatment regimen.  For now:  Please take antibiotic as directed.  Increase fluid intake.  Use Saline nasal spray.  Take a daily multivitamin. Ok to continue your regular allergy medications. Use the tessalon as directed.  Place a humidifier in the bedroom.  Please call or return clinic if symptoms are not improving.  Sinusitis Sinusitis is redness, soreness, and swelling (inflammation) of the paranasal sinuses. Paranasal sinuses are air pockets within the bones of your face (beneath the eyes, the middle of the forehead, or above the eyes). In healthy paranasal sinuses, mucus is able to drain out, and air is able to circulate through them by way of your nose. However, when your paranasal sinuses are inflamed, mucus and air can become trapped. This can allow bacteria and other germs to grow and cause infection. Sinusitis can develop quickly and last only a short time (acute) or continue over a long period (chronic). Sinusitis that lasts for more than 12 weeks is considered chronic.  CAUSES  Causes of sinusitis include: Allergies. Structural abnormalities, such as displacement of the cartilage that separates your nostrils (deviated septum), which can decrease the air flow through your nose and sinuses and affect sinus drainage. Functional abnormalities, such as when the small hairs (cilia) that line your sinuses and help remove mucus do not work properly or are not present. SYMPTOMS  Symptoms of acute and chronic sinusitis are the same.  The primary symptoms are pain and pressure around the affected sinuses. Other symptoms include: Upper toothache. Earache. Headache. Bad breath. Decreased sense of smell and taste. A cough, which worsens when you are lying flat. Fatigue. Fever. Thick drainage from your nose, which often is green and may contain pus (purulent). Swelling and warmth over the affected sinuses. DIAGNOSIS  Your caregiver will perform a physical exam. During the exam, your caregiver may: Look in your nose for signs of abnormal growths in your nostrils (nasal polyps). Tap over the affected sinus to check for signs of infection. View the inside of your sinuses (endoscopy) with a special imaging device with a light attached (endoscope), which is inserted into your sinuses. If your caregiver suspects that you have chronic sinusitis, one or more of the following tests may be recommended: Allergy tests. Nasal culture A sample of mucus is taken from your nose and sent to a lab and screened  for bacteria. Nasal cytology A sample of mucus is taken from your nose and examined by your caregiver to determine if your sinusitis is related to an allergy. TREATMENT  Most cases of acute sinusitis are related to a viral infection and will resolve on their own within 10 days. Sometimes medicines are prescribed to help relieve symptoms (pain medicine, decongestants, nasal steroid sprays, or saline sprays).  However, for sinusitis related to a bacterial infection, your caregiver will prescribe antibiotic medicines. These are medicines that will help kill the bacteria causing the infection.  Rarely, sinusitis is caused by a fungal infection. In theses cases, your caregiver will prescribe antifungal medicine. For some cases of chronic sinusitis, surgery is needed. Generally, these are cases in which sinusitis recurs more than 3 times per year, despite other treatments. HOME CARE INSTRUCTIONS  Drink plenty of water. Water helps thin the  mucus so your sinuses can drain more easily. Use a humidifier. Inhale steam 3 to 4 times a day (for example, sit in the bathroom with the shower running). Apply a warm, moist washcloth to your face 3 to 4 times a day, or as directed by your caregiver. Use saline nasal sprays to help moisten and clean your sinuses. Take over-the-counter or prescription medicines for pain, discomfort, or fever only as directed by your caregiver. SEEK IMMEDIATE MEDICAL CARE IF: You have increasing pain or severe headaches. You have nausea, vomiting, or drowsiness. You have swelling around your face. You have vision problems. You have a stiff neck. You have difficulty breathing. MAKE SURE YOU:  Understand these instructions. Will watch your condition. Will get help right away if you are not doing well or get worse. Document Released: 07/24/2005 Document Revised: 10/16/2011 Document Reviewed: 08/08/2011 [facility] Patient Information 2014 [place], [place].    If you have been instructed to have an in-person evaluation today at a local Urgent Care facility, please use the link below. It will take you to a list of all of our available [facility], including address, phone number and hours of operation. Please do not delay care.  [facility]  If you or a family member do not have a primary care provider, use the link below to schedule a visit and establish care. When you choose a [facility] primary care physician or advanced practice provider, you gain a long-term partner in health. Find a Primary Care Provider  Learn more about [facility]'s in-office and virtual care options: [facility] - Get Care Now

## 2022-12-12 NOTE — Progress Notes (Signed)
Virtual Visit Consent   Alicia Chen, you are scheduled for a virtual visit with a [facility] provider today. Just as with appointments in the office, your consent must be obtained to participate. Your consent will be active for this visit and any virtual visit you may have with one of our providers in the next 365 days. If you have a MyChart account, a copy of this consent can be sent to you electronically.  As this is a virtual visit, video technology does not allow for your provider to perform a traditional examination. This may limit your provider's ability to fully assess your condition. If your provider identifies any concerns that need to be evaluated in person or the need to arrange testing (such as labs, EKG, etc.), we will make arrangements to do so. Although advances in technology are sophisticated, we cannot ensure that it will always work on either your end or our end. If the connection with a video visit is poor, the visit may have to be switched to a telephone visit. With either a video or telephone visit, we are not always able to ensure that we have a secure connection.  By engaging in this virtual visit, you consent to the provision of healthcare and authorize for your insurance to be billed (if applicable) for the services provided during this visit. Depending on your insurance coverage, you may receive a charge related to this service.  I need to obtain your verbal consent now. Are you willing to proceed with your visit today? Alicia Chen has provided verbal consent on 12/12/2022 for a virtual visit (video or telephone). Alicia Chen, [place]  Date: 12/12/2022 12:06 PM  Virtual Visit via Video Note   I, Alicia Chen, connected with  Alicia Chen  ([id number], Jan 02, 1966) on 12/12/22 at 12:00 PM EDT by a video-enabled telemedicine application and verified that I am speaking with the correct person using two identifiers.  Location: Patient: Virtual Visit Location Patient:  Home Provider: Virtual Visit Location Provider: Home Office   I discussed the limitations of evaluation and management by telemedicine and the availability of in person appointments. The patient expressed understanding and agreed to proceed.    History of Present Illness: Alicia Chen is a 57 y.o. who identifies as a female who was assigned female at birth, and is being seen today for 3 days of URI symptoms after going to a gradation service in [place], [place]. Notes was raining there and having to be outside. Notes symptoms starting with nasal congestion, rhinorrhea, eye irritation and now with sinus pressure and sinus pain. Starting now with bilateral ear pressure and facial pain/R ear pain with some dizziness with quick position change. Mucous is now thick and yellow. Now with low-grade fever and sweats. Has not tested for COVID. Does have one left at home.  OTC -- Zyrtec, Flonase.  HPI: HPI  Problems:  Patient Active Problem List   Diagnosis Date Noted   Post-viral cough syndrome 09/15/2022   Dysphagia 04/18/2022   Irritable bowel syndrome 04/18/2022   SOB (shortness of breath) 02/03/2022   Right lumbar radiculopathy 06/03/2021   Left hip pain 06/03/2021   Chronic constipation 11/18/2020   Anxiety 02/11/2019   Elevated LFTs 12/04/2016   Routine general medical examination at a health care facility 07/11/2016   S/P laparoscopic sleeve gastrectomy 10/14/2015   Multiple gastric polyps 08/18/2015   Trigger thumb of left hand 04/05/2015   OSA (obstructive sleep apnea) 02/05/2013   Morbid  obesity (HCC) 09/26/2010   MIGRAINE HEADACHE 02/09/2010   ALLERGIC RHINITIS 02/09/2010   Asthma 02/09/2010   GERD 02/09/2010   Arthritis 02/09/2010    Allergies:  Allergies  Allergen Reactions   Penicillins Hives and Nausea And Vomiting    hives   Medications:  Current Outpatient Medications:    benzonatate (TESSALON) 100 MG capsule, Take 1 capsule (100 mg total) by mouth 3 (three) times daily  as needed for cough., Disp: 30 capsule, Rfl: 0   doxycycline (VIBRA-TABS) 100 MG tablet, Take 1 tablet (100 mg total) by mouth 2 (two) times daily., Disp: 20 tablet, Rfl: 0   albuterol (VENTOLIN HFA) 108 (90 Base) MCG/ACT inhaler, Inhale 1-2 puffs into the lungs every 6 (six) hours as needed for wheezing or shortness of breath., Disp: 1 each, Rfl: 0   ALPRAZolam (XANAX) 0.25 MG tablet, Take 1 tablet (0.25 mg total) by mouth daily as needed for anxiety., Disp: 30 tablet, Rfl: 1   budesonide-formoterol (SYMBICORT) 160-4.5 MCG/ACT inhaler, INHALE 2 PUFFS INTO THE LUNGS TWICE DAILY, Disp: 10.2 g, Rfl: 2   celecoxib (CELEBREX) 100 MG capsule, Take 1 capsule (100 mg total) by mouth 2 (two) times daily as needed., Disp: 60 capsule, Rfl: 1   cetirizine (ZYRTEC) 10 MG tablet, Take 10 mg by mouth daily., Disp: , Rfl:    Cholecalciferol (VITAMIN D) 2000 units tablet, Take 2,000 Units by mouth daily., Disp: , Rfl:    ciclopirox (LOPROX) 0.77 % cream, APPLY TOPICALLY TO THE AFFECTED AREA TWICE DAILY, Disp: , Rfl:    citalopram (CELEXA) 10 MG tablet, Take 1 tablet (10 mg total) by mouth daily. Take with the 20 mg of celexa for a total dose of 30 mg, Disp: 90 tablet, Rfl: 3   citalopram (CELEXA) 20 MG tablet, Take 1 tablet a day. Take with the 10 mg of Celexa for a total dose of 30 mg a day, Disp: 90 tablet, Rfl: 3   famotidine (PEPCID) 20 MG tablet, Take 1 tablet (20 mg total) by mouth 2 (two) times daily as needed for heartburn or indigestion., Disp: 180 tablet, Rfl: 3   fexofenadine (ALLEGRA) 180 MG tablet, Take 1 tablet (180 mg total) by mouth daily., Disp: 90 tablet, Rfl: 0   fluticasone (FLONASE) 50 MCG/ACT nasal spray, Place 1 spray into both nostrils daily. Begin by using 2 sprays in each nare daily for 3 to 5 days, then decrease to 1 spray in each nare daily., Disp: 32 mL, Rfl: 1   gabapentin (NEURONTIN) 300 MG capsule, Take 1 capsule (300 mg total) by mouth 3 (three) times daily as needed., Disp: 90  capsule, Rfl: 3   ipratropium (ATROVENT) 0.06 % nasal spray, Place 2 sprays into both nostrils 4 (four) times daily. As needed for nasal congestion, runny nose, Disp: 15 mL, Rfl: 0   linaclotide (LINZESS) 290 MCG CAPS capsule, Take 1 capsule (290 mcg total) by mouth daily before breakfast., Disp: 90 capsule, Rfl: 3   meclizine (ANTIVERT) 25 MG tablet, Take 1 tablet (25 mg total) by mouth 3 (three) times daily as needed for dizziness., Disp: 30 tablet, Rfl: 0   Multiple Vitamin (MULTIVITAMIN) tablet, Take 1 tablet by mouth daily., Disp: , Rfl:    omeprazole (PRILOSEC) 20 MG capsule, TAKE 1 CAPSULE(20 MG) BY MOUTH DAILY, Disp: 90 capsule, Rfl: 3   phentermine (ADIPEX-P) 37.5 MG tablet, Take 37.5 mg by mouth every morning., Disp: , Rfl:    SUMAtriptan (IMITREX) 50 MG tablet, Take 1 tablet (50  mg total) by mouth every 2 (two) hours as needed for migraine. May repeat in 2 hours if headache persists or recurs., Disp: 10 tablet, Rfl: 5  Observations/Objective: Patient is well-developed, well-nourished in no acute distress.  Resting comfortably at home.  Head is normocephalic, atraumatic.  No labored breathing. Speech is clear and coherent with logical content.  Patient is alert and oriented at baseline.   Assessment and Plan: 1. Acute bacterial sinusitis - doxycycline (VIBRA-TABS) 100 MG tablet; Take 1 tablet (100 mg total) by mouth 2 (two) times daily.  Dispense: 20 tablet; Refill: 0 - benzonatate (TESSALON) 100 MG capsule; Take 1 capsule (100 mg total) by mouth 3 (three) times daily as needed for cough.  Dispense: 30 capsule; Refill: 0  Will have her COVID test to be cautious giving recent travel. However whether COVID or other viral URI at start, now with change in symptoms, concerned for secondary and progressive sinusitis. Rx doxycycline.  Increase fluids.  Rest.  Saline nasal spray.  Probiotic.  Mucinex as directed.  Humidifier in bedroom. Tessalon per orders. Continue Flonase.  Call or return  to clinic if symptoms are not improving.   Follow Up Instructions: I discussed the assessment and treatment plan with the patient. The patient was provided an opportunity to ask questions and all were answered. The patient agreed with the plan and demonstrated an understanding of the instructions.  A copy of instructions were sent to the patient via MyChart unless otherwise noted below.   The patient was advised to call back or seek an in-person evaluation if the symptoms worsen or if the condition fails to improve as anticipated.  Time:  I spent 10 minutes with the patient via telehealth technology discussing the above problems/concerns.    Alicia Climes, PA-C

## 2023-02-01 ENCOUNTER — Ambulatory Visit: Payer: 59 | Admitting: Family Medicine

## 2023-02-01 ENCOUNTER — Encounter: Payer: Self-pay | Admitting: Family Medicine

## 2023-02-01 VITALS — BP 112/84 | HR 87 | Ht 62.0 in | Wt 234.2 lb

## 2023-02-01 DIAGNOSIS — M79671 Pain in right foot: Secondary | ICD-10-CM | POA: Diagnosis not present

## 2023-02-01 DIAGNOSIS — M79672 Pain in left foot: Secondary | ICD-10-CM

## 2023-02-01 NOTE — Progress Notes (Signed)
   I, Stevenson Clinch, CMA acting as a scribe for Alicia Graham, MD.  Alicia Chen is a 57 y.o. female who presents to [facility] today for bilat heel pain. Pt was previously seen by Dr. Denyse Amass on 09/15/22 for lumbar radiculopathy and bilat CTS.   Today, pt c/o bilat heel pain x 1 month. Pt locates pain to bilateral heels, worse with first morning steps. Sx improve throughout the day. Sx are the same with or without shoes.   Aggravates: worse 1st thing in the mornings Treatments tried: massage, Icy Hot, BioFreeze, Tylenol  Relevant social history: Her mother is chronically ill living in [place].  She is trying to get her into a apartment at skilled nursing facility either in [facility] or more locally here in [place] area.  Pertinent review of systems: No fevers or chills  Relevant historical information: Mild migraine headache history    Exam:  BP 112/84   Pulse 87   Ht 5\' 2"  (1.575 m)   Wt 234 lb 3.2 oz (106.2 kg)   LMP 01/18/2014   SpO2 98%   BMI 42.84 kg/m  General: Well Developed, well nourished, and in no acute distress.   MSK: Feet bilaterally pes planus.  Tender palpation posterior calcaneus bilateral feet and ankle. Normal foot and ankle motion. Intact strength.        Assessment and Plan: 57 y.o. female with bilateral posterior calcaneus pain thought to be insertional Achilles tendinitis.  Will try home exercise program heel lifts and Voltaren gel.  Also recommend ice in the evening.  If not sufficient we can add nitroglycerin patch protocol.  She does not have a lot of headaches any longer and would be a potentially good candidate for this.  Recommend trying a month and if not better we can add nitroglycerin patches.   PDMP not reviewed this encounter. No orders of the defined types were placed in this encounter.  No orders of the defined types were placed in this encounter.    Discussed warning  signs or symptoms. Please see discharge instructions. Patient expresses understanding.   The above documentation has been reviewed and is accurate and complete Alicia Chen, M.D.

## 2023-02-01 NOTE — Patient Instructions (Addendum)
Thank you for coming in today.   Please complete the exercises that the athletic trainer went over with you:  View at [email or web address]-[email or web address] using code: [id number]  Please use Voltaren gel (Generic Diclofenac Gel) up to 4x daily for pain as needed.  This is available over-the-counter as both the name brand Voltaren gel and the generic diclofenac gel.   We could add nitroglycerin patches.   Ice in the evening could help.   Heel lifts on [employer].   Let me know if this is not working.

## 2023-05-29 ENCOUNTER — Other Ambulatory Visit: Payer: Self-pay | Admitting: Internal Medicine

## 2023-05-29 DIAGNOSIS — J4520 Mild intermittent asthma, uncomplicated: Secondary | ICD-10-CM

## 2023-06-24 ENCOUNTER — Other Ambulatory Visit: Payer: Self-pay | Admitting: Family Medicine

## 2023-06-24 ENCOUNTER — Other Ambulatory Visit: Payer: Self-pay | Admitting: Internal Medicine

## 2023-06-25 NOTE — Telephone Encounter (Signed)
Rx refill request approved per Dr. Corey's orders. 

## 2023-07-14 ENCOUNTER — Other Ambulatory Visit: Payer: Self-pay | Admitting: Internal Medicine

## 2023-07-14 DIAGNOSIS — J4520 Mild intermittent asthma, uncomplicated: Secondary | ICD-10-CM

## 2024-01-21 ENCOUNTER — Other Ambulatory Visit: Payer: Self-pay | Admitting: Family Medicine

## 2024-01-21 NOTE — Telephone Encounter (Signed)
 Last OV 02/01/23 Next OV not scheduled  Last refill 06/25/23 Qty #90/3

## 2024-03-19 ENCOUNTER — Other Ambulatory Visit: Payer: Self-pay | Admitting: Family Medicine

## 2024-04-24 ENCOUNTER — Telehealth: Payer: Self-pay

## 2024-04-24 DIAGNOSIS — Z6841 Body Mass Index (BMI) 40.0 and over, adult: Secondary | ICD-10-CM | POA: Diagnosis not present

## 2024-04-24 DIAGNOSIS — R600 Localized edema: Secondary | ICD-10-CM | POA: Diagnosis not present

## 2024-04-24 DIAGNOSIS — E519 Thiamine deficiency, unspecified: Secondary | ICD-10-CM | POA: Diagnosis not present

## 2024-04-24 NOTE — Telephone Encounter (Signed)
 Copied from CRM [id number]. Topic: Appointments - Scheduling Inquiry for Clinic >> Apr 24, 2024  4:28 PM Selinda RAMAN wrote: Reason for CRM: The patient called in stating she was seen by a provider with [facility] as her previous insurance was [employer] contracted with them. She did not want to leave but had to for insurance purposes. Her provider schedule and U/S and she was found to have lesions on her right kidney. She was then ordered to get a follow up MRI for further evaluation but by the time she was called her insurance had once again changed and out of network with [facility]. She was last seen by Dr Rollene in April 2024 and needs this appointment ASAP for further evaluation.

## 2024-04-25 NOTE — Telephone Encounter (Signed)
 It look like patient has already been scheduled. I have checked Providers scheduled I do not see anything sooner

## 2024-05-02 ENCOUNTER — Ambulatory Visit: Payer: Self-pay | Admitting: Internal Medicine

## 2024-05-02 ENCOUNTER — Encounter: Payer: Self-pay | Admitting: Internal Medicine

## 2024-05-02 ENCOUNTER — Ambulatory Visit: Admitting: Internal Medicine

## 2024-05-02 VITALS — BP 140/100 | HR 83 | Temp 98.0°F | Ht 62.0 in | Wt 245.0 lb

## 2024-05-02 DIAGNOSIS — N2889 Other specified disorders of kidney and ureter: Secondary | ICD-10-CM

## 2024-05-02 DIAGNOSIS — N281 Cyst of kidney, acquired: Secondary | ICD-10-CM | POA: Diagnosis not present

## 2024-05-02 DIAGNOSIS — C641 Malignant neoplasm of right kidney, except renal pelvis: Secondary | ICD-10-CM

## 2024-05-02 LAB — COMPREHENSIVE METABOLIC PANEL WITH GFR
ALT: 11 U/L (ref 0–35)
AST: 14 U/L (ref 0–37)
Albumin: 4.2 g/dL (ref 3.5–5.2)
Alkaline Phosphatase: 145 U/L — ABNORMAL HIGH (ref 39–117)
BUN: 12 mg/dL (ref 6–23)
CO2: 31 meq/L (ref 19–32)
Calcium: 9.7 mg/dL (ref 8.4–10.5)
Chloride: 104 meq/L (ref 96–112)
Creatinine, Ser: 1.04 mg/dL (ref 0.40–1.20)
GFR: 59.42 mL/min — ABNORMAL LOW (ref >60.00)
Glucose, Bld: 91 mg/dL (ref 70–99)
Potassium: 4.7 meq/L (ref 3.5–5.1)
Sodium: 140 meq/L (ref 135–145)
Total Bilirubin: 0.6 mg/dL (ref 0.2–1.2)
Total Protein: 7.5 g/dL (ref 6.0–8.3)

## 2024-05-02 NOTE — Progress Notes (Signed)
 Subjective:   Patient ID: Alicia Chen, female    DOB: 1966/02/13, 58 y.o.   MRN: [id number]  Discussed the use of AI scribe software for clinical note transcription with the patient, who gave verbal consent to proceed.  History of Present Illness Alicia Chen is a 58 year old female who presents with concerns about a kidney finding on a prior ultrasound.  She is concerned about a finding on a prior abdominal ultrasound, which revealed a complex cyst on one of her kidneys that was not clearly visible. Due to insurance issues, she was unable to get a follow-up scan promptly. She is worried about the unresolved nature of this finding and is eager to have further imaging to clarify the situation.  She has a history of knee issues, exacerbated by a fall, which 'reawakened' her arthritis. A cortisone shot in one knee led to overcompensation and subsequent pain in the other knee. She has resumed walking and is participating in a weight management program, focusing on maintaining her health and ensuring her lab results are within normal ranges.  Her social history includes a recent early retirement to care for her mother, who had dementia and passed away last year. 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 She emphasizes the importance of staying active and engaged in work to maintain her mental and physical health.  She has experienced significant family stress, including the loss of her mother and brother in close succession, which she believes contributed to her mother's rapid decline. She is managing these personal losses while adjusting to her new life circumstances.  Review of Systems  Constitutional: Negative.   HENT: Negative.    Eyes: Negative.   Respiratory:  Negative for cough, chest tightness and shortness of breath.   Cardiovascular:  Negative for chest pain, palpitations and leg swelling.  Gastrointestinal:  Negative for abdominal  distention, abdominal pain, constipation, diarrhea, nausea and vomiting.  Musculoskeletal:  Positive for arthralgias.  Skin: Negative.   Neurological: Negative.   Psychiatric/Behavioral: Negative.      Objective:  Physical Exam Constitutional:      Appearance: She is well-developed. She is obese.  HENT:     Head: Normocephalic and atraumatic.  Cardiovascular:     Rate and Rhythm: Normal rate and regular rhythm.  Pulmonary:     Effort: Pulmonary effort is normal. No respiratory distress.     Breath sounds: Normal breath sounds. No wheezing or rales.  Abdominal:     General: Bowel sounds are normal. There is no distension.     Palpations: Abdomen is soft.     Tenderness: There is no abdominal tenderness.  Musculoskeletal:        General: Tenderness present.     Cervical back: Normal range of motion.  Skin:    General: Skin is warm and dry.  Neurological:     Mental Status: She is alert and oriented to person, place, and time.     Coordination: Coordination normal.     Vitals:   05/02/24 0918  BP: (!) 140/100  Pulse: 83  Temp: 98 F (36.7 C)  TempSrc: Oral  SpO2: 96%  Weight: 245 lb (111.1 kg)  Height: 5' 2 (1.575 m)    Assessment and Plan Assessment & Plan Complex Renal cyst   A complex renal cyst was identified on ultrasound with suboptimal visualization. The differential includes several etiologies and further imaging is needed to rule out other conditions. Order  an MRI of the kidneys for detailed evaluation. Check blood work within 30 days prior to the MRI ordered CMP today.

## 2024-05-02 NOTE — Patient Instructions (Signed)
 We will get the MRI

## 2024-05-02 NOTE — Assessment & Plan Note (Signed)
 A complex renal cyst was identified on ultrasound with suboptimal visualization. The differential includes several etiologies and further imaging is needed to rule out other conditions. Order an MRI of the kidneys for detailed evaluation. Check blood work within 30 days prior to the MRI ordered CMP today.

## 2024-05-15 ENCOUNTER — Encounter: Payer: Self-pay | Admitting: Internal Medicine

## 2024-05-26 ENCOUNTER — Encounter: Payer: Self-pay | Admitting: Internal Medicine

## 2024-05-27 MED ORDER — ALPRAZOLAM 0.25 MG PO TABS: 0.2500 mg | ORAL_TABLET | Freq: Once | ORAL | 0 refills | Status: AC

## 2024-06-01 ENCOUNTER — Ambulatory Visit
Admission: RE | Admit: 2024-06-01 | Discharge: 2024-06-01 | Disposition: A | Discharge status: Home / Self Care | Source: Ambulatory Visit | Attending: Internal Medicine | Admitting: Internal Medicine

## 2024-06-01 DIAGNOSIS — N2889 Other specified disorders of kidney and ureter: Secondary | ICD-10-CM

## 2024-06-01 MED ORDER — GADOPICLENOL 0.5 MMOL/ML IV SOLN
10.0000 mL | Freq: Once | INTRAVENOUS | Status: AC | PRN
  Administered 2024-06-01: 10 mL via INTRAVENOUS

## 2024-06-02 ENCOUNTER — Telehealth: Payer: Self-pay

## 2024-06-02 NOTE — Telephone Encounter (Signed)
 Copied from CRM [id number]. Topic: Clinical - Lab/Test Results >> Jun 02, 2024 11:32 AM Alicia Chen wrote: Reason for CRM: Patient returned call to Dr Rollene regarding MRI results.Patient would like a return call as soon as provider gets a chance.

## 2024-06-02 NOTE — Telephone Encounter (Signed)
 Please advise pt on her results per note

## 2024-06-12 DIAGNOSIS — N281 Cyst of kidney, acquired: Secondary | ICD-10-CM | POA: Diagnosis not present

## 2024-06-12 DIAGNOSIS — D49511 Neoplasm of unspecified behavior of right kidney: Secondary | ICD-10-CM | POA: Diagnosis not present

## 2024-06-12 DIAGNOSIS — R399 Unspecified symptoms and signs involving the genitourinary system: Secondary | ICD-10-CM | POA: Diagnosis not present

## 2024-06-19 DIAGNOSIS — Z6841 Body Mass Index (BMI) 40.0 and over, adult: Secondary | ICD-10-CM | POA: Diagnosis not present

## 2024-06-19 DIAGNOSIS — E669 Obesity, unspecified: Secondary | ICD-10-CM | POA: Diagnosis not present

## 2024-06-19 DIAGNOSIS — Z713 Dietary counseling and surveillance: Secondary | ICD-10-CM | POA: Diagnosis not present

## 2024-06-25 ENCOUNTER — Other Ambulatory Visit (HOSPITAL_COMMUNITY): Payer: Self-pay

## 2024-06-25 MED ORDER — TOPIRAMATE 25 MG PO TABS
25.0000 mg | ORAL_TABLET | Freq: Two times a day (BID) | ORAL | 0 refills | Status: AC
  Filled 2024-07-10: qty 150, 75d supply, fill #0

## 2024-06-25 MED ORDER — FLUTICASONE PROPIONATE 50 MCG/ACT NA SUSP: 2.0000 | Freq: Every day | NASAL | 1 refills | Status: AC

## 2024-06-25 MED ORDER — VITAMIN B-1 100 MG PO TABS: 100.0000 mg | ORAL_TABLET | Freq: Every day | ORAL | 3 refills | Status: AC

## 2024-06-25 MED ORDER — OMEPRAZOLE 20 MG PO CPDR: 20.0000 mg | DELAYED_RELEASE_CAPSULE | Freq: Every morning | ORAL | 1 refills | Status: AC

## 2024-06-25 MED ORDER — HYDROCHLOROTHIAZIDE 12.5 MG PO TABS: 12.5000 mg | ORAL_TABLET | Freq: Every day | ORAL | 0 refills | Status: AC | PRN

## 2024-06-26 ENCOUNTER — Other Ambulatory Visit (HOSPITAL_COMMUNITY): Payer: Self-pay

## 2024-07-08 DIAGNOSIS — N281 Cyst of kidney, acquired: Secondary | ICD-10-CM | POA: Diagnosis not present

## 2024-07-10 ENCOUNTER — Other Ambulatory Visit (HOSPITAL_COMMUNITY): Payer: Self-pay

## 2024-07-10 MED FILL — Gabapentin Cap 300 MG: ORAL | 30 days supply | Qty: 90 | Fill #0 | Status: CN

## 2024-07-10 MED FILL — Gabapentin Cap 300 MG: ORAL | 30 days supply | Qty: 90 | Fill #0 | Status: AC

## 2024-07-12 ENCOUNTER — Inpatient Hospital Stay: Admission: RE | Admit: 2024-07-12 | Discharge: 2024-07-12 | Attending: Nurse Practitioner

## 2024-07-12 ENCOUNTER — Other Ambulatory Visit (HOSPITAL_COMMUNITY): Payer: Self-pay

## 2024-07-12 VITALS — BP 133/84 | HR 103 | Temp 99.5°F | Resp 16

## 2024-07-12 DIAGNOSIS — U071 COVID-19: Secondary | ICD-10-CM | POA: Diagnosis not present

## 2024-07-12 LAB — POC COVID19/FLU A&B COMBO
Covid Antigen, POC: POSITIVE — AB
Influenza A Antigen, POC: NEGATIVE
Influenza B Antigen, POC: NEGATIVE

## 2024-07-12 LAB — POCT RAPID STREP A (OFFICE): Rapid Strep A Screen: NEGATIVE

## 2024-07-12 MED ORDER — PSEUDOEPH-BROMPHEN-DM 30-2-10 MG/5ML PO SYRP
10.0000 mL | ORAL_SOLUTION | Freq: Four times a day (QID) | ORAL | 0 refills | Status: AC | PRN
  Filled 2024-07-12: qty 120, 3d supply, fill #0

## 2024-07-12 MED ORDER — PAXLOVID (300/100) 20 X 150 MG & 10 X 100MG PO TBPK
3.0000 | ORAL_TABLET | Freq: Two times a day (BID) | ORAL | 0 refills | Status: AC
  Filled 2024-07-12: qty 30, 5d supply, fill #0

## 2024-07-12 MED ORDER — MUCINEX DM MAXIMUM STRENGTH 60-1200 MG PO TB12
1.0000 | ORAL_TABLET | Freq: Two times a day (BID) | ORAL | 0 refills | Status: AC
  Filled 2024-07-12: qty 14, 7d supply, fill #0

## 2024-07-12 NOTE — ED Triage Notes (Signed)
 Pt c/o sore throat, chills, congestion, cough, and headache since Thursday

## 2024-07-12 NOTE — Discharge Instructions (Addendum)
 You have tested positive for COVID-19. COVID is a viral infection, and antibiotics are not used to treat viral illnesses because they are designed to kill bacteria, not viruses.  You have been prescribed a medication called Paxlovid , which is an antiviral used to treat mild to moderate cases of COVID-19. It is important to take Paxlovid  exactly as prescribed and not to skip any doses. Some people may experience side effects such as a change in taste, diarrhea, high blood pressure, or muscle aches. These side effects are usually mild and go away after the treatment is complete. Take tylenol  and/or ibuprofen as needed for fevers, headache and body aches. Be sure to drink plenty of fluids and get plenty of rest while you recover.   The CDC updated its isolation guidance for COVID-19 and states that a 5-day isolation period following a positive test result is no longer needed. Under the new guidelines, people will not need to isolate if they are fever-free for at least 24 hours without medication and if their symptoms are mild and improving. You may return to normal activities if your symptoms are overall improving and you have been without a fever for 24 hours without taking fever reducing medications like Tylenol  or ibuprofen.   Monitor your symptoms closely over the next several days. If your symptoms are not improving within a week, follow up with your primary care provider.  Go to the emergency department right away if you develop shortness of breath, chest pain, dizziness, fainting, confusion, severe weakness, or if you are unable to keep fluids down, as these may be signs of more serious illness.

## 2024-07-12 NOTE — ED Provider Notes (Signed)
 [facility]    CSN: [id number] Arrival date & time: 07/12/24  0950      History   Chief Complaint Chief Complaint  Patient presents with   Cough    Had very sore throat, headache,  very congested, thick yellowish mucus - Entered by patient    HPI Alicia Chen is a 58 y.o. female.   Discussed the use of AI scribe software for clinical note transcription with the patient, who gave verbal consent to proceed.   Patient presents with a 2-day history of cough, sore throat, nasal congestion, chills, and headaches that began on Thursday. The patient describes their throat as feeling like it's on fire. They report experiencing a low-grade fever and stomach queasiness yesterday. The patient denies body aches, runny nose, sneezing, drainage down the back of the throat, productive cough, wheezing, shortness of breath, nausea, vomiting, or diarrhea. They believe this illness is something being passed around at work and report recent exposure to sick individuals in the workplace. The patient has a medical history significant for asthma, acid reflux, migraines, and recently diagnosed renal cysts.  The following sections of the patient's history were reviewed and updated as appropriate: allergies, current medications, past family history, past medical history, past social history, past surgical history, and problem list.     Past Medical History:  Diagnosis Date   ALLERGIC RHINITIS    ANEMIA-NOS    ARTHRITIS    ASTHMA    DEPRESSION    Fibroid    GERD    MIGRAINE HEADACHE    OBESITY, MORBID    Vitamin D  deficiency     Patient Active Problem List   Diagnosis Date Noted   Complex renal cyst 05/02/2024   Post-viral cough syndrome 09/15/2022   Dysphagia 04/18/2022   Irritable bowel syndrome 04/18/2022   SOB (shortness of breath) 02/03/2022   Right lumbar radiculopathy 06/03/2021   Left hip pain 06/03/2021   Chronic constipation 11/18/2020   Anxiety 02/11/2019   Elevated  LFTs 12/04/2016   Routine general medical examination at a health care facility 07/11/2016   S/P laparoscopic sleeve gastrectomy 10/14/2015   Multiple gastric polyps 08/18/2015   Trigger thumb of left hand 04/05/2015   OSA (obstructive sleep apnea) 02/05/2013   Morbid obesity (HCC) 09/26/2010   MIGRAINE HEADACHE 02/09/2010   ALLERGIC RHINITIS 02/09/2010   Asthma 02/09/2010   GERD 02/09/2010   Arthritis 02/09/2010    Past Surgical History:  Procedure Laterality Date   CHOLECYSTECTOMY  2009   ESOPHAGOGASTRODUODENOSCOPY N/A 12/23/2012   Procedure: ESOPHAGOGASTRODUODENOSCOPY (EGD);  Surgeon: Renaye Sous, MD;  Location: [facility];  Service: Endoscopy;  Laterality: N/A;   INTRAUTERINE DEVICE (IUD) INSERTION  12-15-13   Mirena   LAPAROSCOPIC GASTRIC SLEEVE RESECTION  2/17   ROOT CANAL     Skin graft surgery (R) leg     childhood    OB History     Gravida  2   Para  2   Term  1   Preterm  1   AB      Living  2      SAB      IAB      Ectopic      Multiple  2   Live Births  4            Home Medications    Prior to Admission medications   Medication Sig Start Date End Date Taking? Authorizing Provider  brompheniramine-pseudoephedrine-DM 30-2-10 MG/5ML syrup Take 10 mLs  by mouth every 6 (six) hours as needed (cough and congestion). 07/12/24  Yes Iola Lukes, FNP  Dextromethorphan -guaiFENesin  (MUCINEX  DM MAXIMUM STRENGTH) 60-1200 MG TB12 Take 1 tablet by mouth 2 (two) times daily. 07/12/24  Yes Iola Lukes, FNP  nirmatrelvir /ritonavir  (PAXLOVID , 300/100,) 20 x 150 MG & 10 x 100MG  TBPK Take 3 tablets by mouth 2 (two) times daily for 5 days. Patient GFR is 59.42. Take nirmatrelvir  (150 mg) two tablets twice daily for 5 days and ritonavir  (100 mg) one tablet twice daily for 5 days. 07/12/24 07/17/24 Yes Iola Lukes, FNP  albuterol  (VENTOLIN  HFA) 108 (90 Base) MCG/ACT inhaler INHALE 1 TO 2 PUFFS INTO THE LUNGS EVERY 4 HOURS AS NEEDED FOR WHEEZING OR  SHORTNESS OF BREATH 07/30/23   Rollene Almarie LABOR, MD  cetirizine  (ZYRTEC ) 10 MG tablet Take 10 mg by mouth daily. 08/16/22   [provider]  Cholecalciferol (VITAMIN D ) 2000 units tablet Take 2,000 Units by mouth daily.    [provider]  ciclopirox (LOPROX) 0.77 % cream APPLY TOPICALLY TO THE AFFECTED AREA TWICE DAILY 12/31/19   [provider]  famotidine  (PEPCID ) 20 MG tablet Take 1 tablet (20 mg total) by mouth 2 (two) times daily as needed for heartburn or indigestion. 06/22/22   Rollene Almarie LABOR, MD  fluticasone  (FLONASE ) 50 MCG/ACT nasal spray Place 1 spray into both nostrils daily. Begin by using 2 sprays in each nare daily for 3 to 5 days, then decrease to 1 spray in each nare daily. 10/30/21   Joesph Shaver Scales, PA-C  fluticasone  (FLONASE ) 50 MCG/ACT nasal spray Shake liquid a place 2 sprays into both nostrils daily. 11/01/23     gabapentin  (NEURONTIN ) 300 MG capsule Take 1 capsule (300 mg total) by mouth 3 (three) times daily as needed 03/20/24   Corey, Evan S, MD  hydrochlorothiazide  (HYDRODIURIL ) 12.5 MG tablet Take 1 tablet (12.5 mg total) by mouth daily as needed for swelling. 04/24/24     linaclotide  (LINZESS ) 290 MCG CAPS capsule TAKE 1 CAPSULE(290 MCG) BY MOUTH DAILY BEFORE BREAKFAST 06/25/23   Rollene Almarie LABOR, MD  meclizine  (ANTIVERT ) 25 MG tablet Take 1 tablet (25 mg total) by mouth 3 (three) times daily as needed for dizziness. 09/28/21   Billy Asberry FALCON, PA-C  Multiple Vitamin (MULTIVITAMIN) tablet Take 1 tablet by mouth daily.    [provider]  omeprazole  (PRILOSEC) 20 MG capsule TAKE 1 CAPSULE(20 MG) BY MOUTH DAILY 01/17/22   Rollene Almarie LABOR, MD  omeprazole  (PRILOSEC) 20 MG capsule Take 1 capsule (20 mg total) by mouth in the morning. 07/04/23     phentermine  (ADIPEX-P ) 37.5 MG tablet Take 37.5 mg by mouth every morning. 08/16/22   [provider]  SUMAtriptan  (IMITREX ) 50 MG tablet Take 1 tablet (50 mg total)  by mouth every 2 (two) hours as needed for migraine. May repeat in 2 hours if headache persists or recurs. Patient not taking: Reported on 05/02/2024 12/25/19   Rollene Almarie LABOR, MD  thiamine (VITAMIN B-1) 100 MG tablet Take 1 tablet (100 mg total) by mouth daily. 11/08/23     topiramate  (TOPAMAX ) 25 MG tablet Take 1 tablet (25 mg total) by mouth daily at noon for 1 week, then 1 tablet twice daily (noon and 5PM). 03/19/24       Family History Family History  Problem Relation Age of Onset   Arthritis Mother    Arthritis Father    Alcohol abuse Other        Parents  Arthritis Other        grandparents   Diabetes Other        Parent & grandparent   Hyperlipidemia Other        Parent, grandparent, other relative   Hypertension Other        parent, grandparent, other relative   Prostate cancer Other        grandfather   Breast cancer Sister    Breast cancer Sister    Breast cancer Paternal Aunt     Social History Social History   Tobacco Use   Smoking status: Never   Smokeless tobacco: Never  Vaping Use   Vaping status: Never Used  Substance Use Topics   Alcohol use: Yes    Comment: Social   Drug use: No     Allergies   Penicillins   Review of Systems Review of Systems  Constitutional:  Positive for chills. Negative for fever.  HENT:  Positive for congestion, postnasal drip, rhinorrhea, sneezing and sore throat.   Respiratory:  Positive for cough. Negative for shortness of breath and wheezing.   Gastrointestinal:  Positive for nausea. Negative for diarrhea and vomiting.  Musculoskeletal:  Positive for myalgias.  Neurological:  Positive for headaches.  All other systems reviewed and are negative.    Physical Exam Triage Vital Signs ED Triage Vitals  Encounter Vitals Group     BP 07/12/24 0959 133/84     Girls Systolic BP Percentile --      Girls Diastolic BP Percentile --      Boys Systolic BP Percentile --      Boys Diastolic BP Percentile --      Pulse  Rate 07/12/24 0959 (!) 103     Resp 07/12/24 0959 16     Temp 07/12/24 0959 99.5 F (37.5 C)     Temp Source 07/12/24 0959 Oral     SpO2 07/12/24 0959 96 %     Weight --      Height --      Head Circumference --      Peak Flow --      Pain Score 07/12/24 1001 5     Pain Loc --      Pain Education --      Exclude from Growth Chart --    No data found.  Updated Vital Signs BP 133/84 (BP Location: Right Arm)   Pulse (!) 103   Temp 99.5 F (37.5 C) (Oral)   Resp 16   LMP 01/18/2014   SpO2 96%   Visual Acuity Right Eye Distance:   Left Eye Distance:   Bilateral Distance:    Right Eye Near:   Left Eye Near:    Bilateral Near:     Physical Exam Vitals reviewed.  Constitutional:      General: She is awake. She is not in acute distress.    Appearance: Normal appearance. She is well-developed. She is not ill-appearing, toxic-appearing or diaphoretic.  HENT:     Head: Normocephalic.     Right Ear: Tympanic membrane, ear canal and external ear normal. No drainage, swelling or tenderness. No middle ear effusion. Tympanic membrane is not erythematous.     Left Ear: Tympanic membrane, ear canal and external ear normal. No drainage, swelling or tenderness.  No middle ear effusion. Tympanic membrane is not erythematous.     Nose: Congestion present.     Mouth/Throat:     Lips: Pink.     Mouth: Mucous membranes are moist.  Pharynx: No pharyngeal swelling, oropharyngeal exudate, posterior oropharyngeal erythema or uvula swelling.     Tonsils: No tonsillar exudate or tonsillar abscesses.  Eyes:     General: Vision grossly intact.     Conjunctiva/sclera: Conjunctivae normal.  Cardiovascular:     Rate and Rhythm: Normal rate.     Heart sounds: Normal heart sounds.  Pulmonary:     Effort: Pulmonary effort is normal. No tachypnea or respiratory distress.     Breath sounds: Normal breath sounds and air entry.  Musculoskeletal:        General: Normal range of motion.      Cervical back: Normal range of motion and neck supple.  Lymphadenopathy:     Cervical: No cervical adenopathy.  Skin:    General: Skin is warm and dry.  Neurological:     General: No focal deficit present.     Mental Status: She is alert and oriented to person, place, and time.  Psychiatric:        Behavior: Behavior is cooperative.      UC Treatments / Results  Labs (all labs ordered are listed, but only abnormal results are displayed) Labs Reviewed  POC COVID19/FLU A&B COMBO - Abnormal; Notable for the following components:      Result Value   Covid Antigen, POC Positive (*)    All other components within normal limits  POCT RAPID STREP A (OFFICE)    EKG   Radiology No results found.  Procedures Procedures (including critical care time)  Medications Ordered in UC Medications - No data to display  Initial Impression / Assessment and Plan / UC Course  I have reviewed the triage vital signs and the nursing notes.  Pertinent labs & imaging results that were available during my care of the patient were reviewed by me and considered in my medical decision making (see chart for details).     The patient presents with symptoms of an acute respiratory illness and tested positive for COVID-19. Patient has history of asthma. No evidence of acute exacerbation. Will prescribed paxlovid . Normal renal function based on studies from 05/02/24 with Cr 1.04 & GFR 59.42. The patient should maintain hydration and get adequate rest. Education was provided regarding the current understanding of COVID-19, which has generally become less severe compared to earlier stages of the pandemic due to increased immunity and viral evolution. The patient was counseled on updated CDC guidance, which no longer requires a strict five-day isolation period. Instead, return to normal activities is appropriate once symptoms are improving overall and the patient has been fever-free for at least 24 hours without  fever-reducing medications. Close monitoring of symptoms over the next several days was advised, with follow-up with primary care if symptoms are not improving within a week. The patient should seek emergency care for shortness of breath, chest pain, dizziness, fainting, confusion, severe weakness, or inability to keep fluids down.  Today's evaluation has revealed no signs of a dangerous process. Discussed diagnosis with patient and/or guardian. Patient and/or guardian aware of their diagnosis, possible red flag symptoms to watch out for and need for close follow up. Patient and/or guardian understands verbal and written discharge instructions. Patient and/or guardian comfortable with plan and disposition.  Patient and/or guardian has a clear mental status at this time, good insight into illness (after discussion and teaching) and has clear judgment to make decisions regarding their care  Documentation was completed with the aid of voice recognition software. Transcription may contain typographical errors.  Final Clinical Impressions(s) / UC Diagnoses   Final diagnoses:  COVID-19     Discharge Instructions      You have tested positive for COVID-19. COVID is a viral infection, and antibiotics are not used to treat viral illnesses because they are designed to kill bacteria, not viruses.  You have been prescribed a medication called Paxlovid , which is an antiviral used to treat mild to moderate cases of COVID-19. It is important to take Paxlovid  exactly as prescribed and not to skip any doses. Some people may experience side effects such as a change in taste, diarrhea, high blood pressure, or muscle aches. These side effects are usually mild and go away after the treatment is complete. Take tylenol  and/or ibuprofen as needed for fevers, headache and body aches. Be sure to drink plenty of fluids and get plenty of rest while you recover.   The CDC updated its isolation guidance for COVID-19 and  states that a 5-day isolation period following a positive test result is no longer needed. Under the new guidelines, people will not need to isolate if they are fever-free for at least 24 hours without medication and if their symptoms are mild and improving. You may return to normal activities if your symptoms are overall improving and you have been without a fever for 24 hours without taking fever reducing medications like Tylenol  or ibuprofen.   Monitor your symptoms closely over the next several days. If your symptoms are not improving within a week, follow up with your primary care provider.  Go to the emergency department right away if you develop shortness of breath, chest pain, dizziness, fainting, confusion, severe weakness, or if you are unable to keep fluids down, as these may be signs of more serious illness.       ED Prescriptions     Medication Sig Dispense Auth. Provider   nirmatrelvir /ritonavir  (PAXLOVID , 300/100,) 20 x 150 MG & 10 x 100MG  TBPK Take 3 tablets by mouth 2 (two) times daily for 5 days. Patient GFR is 59.42. Take nirmatrelvir  (150 mg) two tablets twice daily for 5 days and ritonavir  (100 mg) one tablet twice daily for 5 days. 30 tablet Iola Lukes, FNP   brompheniramine-pseudoephedrine-DM 30-2-10 MG/5ML syrup Take 10 mLs by mouth every 6 (six) hours as needed (cough and congestion). 120 mL Iola Lukes, FNP   Dextromethorphan -guaiFENesin  (MUCINEX  DM MAXIMUM STRENGTH) 60-1200 MG TB12 Take 1 tablet by mouth 2 (two) times daily. 14 tablet Iola Lukes, FNP      PDMP not reviewed this encounter.   Iola Lukes, [place] 07/12/24 1322

## 2024-07-17 ENCOUNTER — Other Ambulatory Visit (HOSPITAL_COMMUNITY): Payer: Self-pay

## 2024-07-17 DIAGNOSIS — K219 Gastro-esophageal reflux disease without esophagitis: Secondary | ICD-10-CM | POA: Diagnosis not present

## 2024-07-17 DIAGNOSIS — Z6841 Body Mass Index (BMI) 40.0 and over, adult: Secondary | ICD-10-CM | POA: Diagnosis not present

## 2024-07-17 DIAGNOSIS — E785 Hyperlipidemia, unspecified: Secondary | ICD-10-CM | POA: Diagnosis not present

## 2024-07-17 MED ORDER — PHENTERMINE HCL 37.5 MG PO TABS
18.2500 mg | ORAL_TABLET | Freq: Two times a day (BID) | ORAL | 1 refills | Status: AC
  Filled 2024-07-17: qty 30, 30d supply, fill #0

## 2024-07-18 ENCOUNTER — Other Ambulatory Visit: Payer: Self-pay | Admitting: Urology

## 2024-07-21 ENCOUNTER — Other Ambulatory Visit (HOSPITAL_COMMUNITY): Payer: Self-pay

## 2024-08-11 ENCOUNTER — Other Ambulatory Visit: Payer: Self-pay | Admitting: Medical Genetics

## 2024-08-12 ENCOUNTER — Ambulatory Visit: Admitting: Internal Medicine

## 2024-08-12 ENCOUNTER — Encounter: Payer: Self-pay | Admitting: Internal Medicine

## 2024-08-12 VITALS — BP 110/70 | HR 67 | Temp 98.4°F | Ht 62.0 in | Wt 247.0 lb

## 2024-08-12 DIAGNOSIS — N281 Cyst of kidney, acquired: Secondary | ICD-10-CM | POA: Diagnosis not present

## 2024-08-12 DIAGNOSIS — Z01810 Encounter for preprocedural cardiovascular examination: Secondary | ICD-10-CM | POA: Diagnosis not present

## 2024-08-12 NOTE — Progress Notes (Signed)
 "  Subjective:   Patient ID: Alicia Chen, female    DOB: 21-Feb-1966, 59 y.o.   MRN: [id number]  Discussed the use of AI scribe software for clinical note transcription with the patient, who gave verbal consent to proceed.  History of Present Illness Alicia Chen is a 59 year old female with a Bosniak category 4 renal cyst who presents for preoperative evaluation and discussion of recent diagnostic findings.  She is scheduled for surgery at the end of February to address a Bosniak category 4 renal cyst. She is concerned about the potential for the cyst to be cancerous and the implications of the surgery. She has not had a follow-up consultation with the surgeon since November and feels uncertain about the procedure and its outcomes.  A CT scan of her chest was ordered by her urologist, but the results have not been communicated to her current provider. She mentions that the CT scan indicated cardiomegaly and a thyroid  nodule, which were concerning to her. She is unsure if these findings require further follow-up.  She has been prescribed phentermine  for weight loss and experiences palpitations after taking a half dose. She is concerned about its effects on her heart, especially in light of the CT scan findings.  She has a history of constipation and takes Miralax every other day. She is considering increasing her Miralax intake during recovery due to concerns about postoperative pain medication exacerbating constipation.  Recently, she contracted COVID-19, which she managed with Paxlovid . She continues to experience residual chest congestion and nocturnal productive cough. She used an inhaler a few times during her illness and continues to feel some congestion.  EKG: Rate 76, axis normal, interval normal, sinus, no st or t wave changes, no significant change compared to prior 2014  Review of Systems  Constitutional: Negative.   HENT: Negative.    Eyes: Negative.   Respiratory:  Negative for  cough, chest tightness and shortness of breath.   Cardiovascular:  Negative for chest pain, palpitations and leg swelling.  Gastrointestinal:  Negative for abdominal distention, abdominal pain, constipation, diarrhea, nausea and vomiting.  Musculoskeletal: Negative.   Skin: Negative.   Neurological: Negative.   Psychiatric/Behavioral: Negative.      Objective:  Physical Exam Constitutional:      Appearance: She is well-developed.  HENT:     Head: Normocephalic and atraumatic.  Cardiovascular:     Rate and Rhythm: Normal rate and regular rhythm.  Pulmonary:     Effort: Pulmonary effort is normal. No respiratory distress.     Breath sounds: Normal breath sounds. No wheezing or rales.  Abdominal:     General: Bowel sounds are normal. There is no distension.     Palpations: Abdomen is soft.     Tenderness: There is no abdominal tenderness.  Musculoskeletal:     Cervical back: Normal range of motion.  Skin:    General: Skin is warm and dry.  Neurological:     Mental Status: She is alert and oriented to person, place, and time.     Coordination: Coordination normal.     Vitals:   08/12/24 0808  BP: 110/70  Pulse: 67  Temp: 98.4 F (36.9 C)  TempSrc: Oral  SpO2: 99%  Weight: 247 lb (112 kg)  Height: 5' 2 (1.575 m)   I personally spent a total of 34 minutes in the care of the patient today including preparing to see the patient, getting/reviewing separately obtained history, counseling and educating, and placing orders.  Assessment and Plan Assessment & Plan Preoperative cardiovascular evaluation   EKG is normal. There are concerns about phentermine  use due to palpitations and potential cardiac effects she is asked to discontinue this. She is low risk to proceed and BP at goal. She is counseled about bowel regimen with opioid treatment after surgery. Faxed a note to obtain CT scan results from the urologist.   Complex renal cyst, Bosniak 4   The Bosniak 4 cyst indicates a  high suspicion of malignancy. Surgical removal is planned due to its central location and the inability to perform a partial nephrectomy. Discussed the necessity of surgery versus monitoring and informed consent regarding surgery risks and postoperative monitoring. Proceed with surgical removal of the renal cyst and monitor for postoperative complications.   "

## 2024-08-12 NOTE — Patient Instructions (Signed)
 You are medically cleared to do the surgery and we will fax them the form today.

## 2024-08-13 ENCOUNTER — Other Ambulatory Visit: Payer: Self-pay | Admitting: Family Medicine

## 2024-08-13 ENCOUNTER — Other Ambulatory Visit (HOSPITAL_COMMUNITY): Payer: Self-pay

## 2024-08-13 ENCOUNTER — Other Ambulatory Visit: Payer: Self-pay

## 2024-08-13 MED ORDER — GABAPENTIN 300 MG PO CAPS
300.0000 mg | ORAL_CAPSULE | Freq: Three times a day (TID) | ORAL | 0 refills | Status: AC
  Filled 2024-08-13 – 2024-09-09 (×2): qty 90, 30d supply, fill #0

## 2024-08-13 NOTE — Telephone Encounter (Signed)
 Rx refill request approved per Dr. Zollie Pee orders.

## 2024-08-23 ENCOUNTER — Other Ambulatory Visit (HOSPITAL_COMMUNITY): Payer: Self-pay

## 2024-09-04 ENCOUNTER — Other Ambulatory Visit

## 2024-09-09 ENCOUNTER — Other Ambulatory Visit: Payer: Self-pay

## 2024-09-09 ENCOUNTER — Other Ambulatory Visit (HOSPITAL_COMMUNITY): Payer: Self-pay

## 2024-09-11 ENCOUNTER — Other Ambulatory Visit

## 2024-09-11 DIAGNOSIS — Z006 Encounter for examination for normal comparison and control in clinical research program: Secondary | ICD-10-CM

## 2024-09-11 LAB — GENECONNECT MOLECULAR SCREEN

## 2024-09-18 ENCOUNTER — Encounter (HOSPITAL_COMMUNITY): Admission: RE | Admit: 2024-09-18

## 2024-09-29 ENCOUNTER — Inpatient Hospital Stay (HOSPITAL_COMMUNITY): Admit: 2024-09-29 | Admitting: Urology

## 2024-09-29 SURGERY — NEPHRECTOMY, RADICAL, ROBOT-ASSISTED, LAPAROSCOPIC, ADULT
Anesthesia: General
